# Patient Record
Sex: Female | Born: 1935
Health system: Southern US, Community
[De-identification: ages and names within clinical notes are randomized; demographics above are authoritative.]

## PROBLEM LIST (undated history)

## (undated) DIAGNOSIS — G309 Alzheimer's disease, unspecified: Secondary | ICD-10-CM

## (undated) DIAGNOSIS — I10 Essential (primary) hypertension: Secondary | ICD-10-CM

## (undated) DIAGNOSIS — F0280 Dementia in other diseases classified elsewhere without behavioral disturbance: Secondary | ICD-10-CM

## (undated) DIAGNOSIS — M1990 Unspecified osteoarthritis, unspecified site: Secondary | ICD-10-CM

## (undated) DIAGNOSIS — I341 Nonrheumatic mitral (valve) prolapse: Secondary | ICD-10-CM

## (undated) DIAGNOSIS — B019 Varicella without complication: Secondary | ICD-10-CM

## (undated) HISTORY — PX: ABDOMINAL HYSTERECTOMY: SHX81

## (undated) HISTORY — DX: Unspecified osteoarthritis, unspecified site: M19.90

## (undated) HISTORY — PX: OTHER SURGICAL HISTORY: SHX169

## (undated) HISTORY — DX: Varicella without complication: B01.9

## (undated) HISTORY — PX: TONSILLECTOMY AND ADENOIDECTOMY: SUR1326

## (undated) HISTORY — DX: Nonrheumatic mitral (valve) prolapse: I34.1

## (undated) HISTORY — PX: EYE SURGERY: SHX253

## (undated) HISTORY — DX: Essential (primary) hypertension: I10

---

## 2007-10-13 ENCOUNTER — Emergency Department (HOSPITAL_COMMUNITY): Admission: EM | Admit: 2007-10-13 | Discharge: 2007-10-13 | Payer: Self-pay | Admitting: Emergency Medicine

## 2010-07-01 ENCOUNTER — Ambulatory Visit: Payer: Self-pay | Admitting: Internal Medicine

## 2010-07-06 ENCOUNTER — Ambulatory Visit: Payer: Self-pay | Admitting: Internal Medicine

## 2010-07-09 ENCOUNTER — Ambulatory Visit: Payer: Self-pay | Admitting: Internal Medicine

## 2010-07-12 ENCOUNTER — Ambulatory Visit (INDEPENDENT_AMBULATORY_CARE_PROVIDER_SITE_OTHER): Payer: Medicare Other | Admitting: Internal Medicine

## 2010-07-12 ENCOUNTER — Encounter: Payer: Self-pay | Admitting: Internal Medicine

## 2010-07-12 VITALS — BP 136/86 | HR 74 | Temp 98.0°F | Resp 16 | Ht 67.0 in | Wt 127.0 lb

## 2010-07-12 DIAGNOSIS — I059 Rheumatic mitral valve disease, unspecified: Secondary | ICD-10-CM

## 2010-07-12 DIAGNOSIS — Z0000 Encounter for general adult medical examination without abnormal findings: Secondary | ICD-10-CM

## 2010-07-12 DIAGNOSIS — I10 Essential (primary) hypertension: Secondary | ICD-10-CM

## 2010-07-12 DIAGNOSIS — I341 Nonrheumatic mitral (valve) prolapse: Secondary | ICD-10-CM

## 2010-07-12 LAB — CBC WITH DIFFERENTIAL/PLATELET
Basophils Relative: 0.7 % (ref 0.0–3.0)
Eosinophils Relative: 2 % (ref 0.0–5.0)
Hemoglobin: 12.2 g/dL (ref 12.0–15.0)
Lymphocytes Relative: 36.3 % (ref 12.0–46.0)
MCV: 88.7 fl (ref 78.0–100.0)
Monocytes Absolute: 0.3 10*3/uL (ref 0.1–1.0)
Neutrophils Relative %: 46.7 % (ref 43.0–77.0)
RBC: 4.06 Mil/uL (ref 3.87–5.11)
WBC: 2.2 10*3/uL — ABNORMAL LOW (ref 4.5–10.5)

## 2010-07-12 LAB — LIPID PANEL: Cholesterol: 190 mg/dL (ref 0–200)

## 2010-07-12 LAB — BASIC METABOLIC PANEL
BUN: 22 mg/dL (ref 6–23)
Chloride: 100 mEq/L (ref 96–112)
Glucose, Bld: 65 mg/dL — ABNORMAL LOW (ref 70–99)
Potassium: 4 mEq/L (ref 3.5–5.1)

## 2010-07-12 LAB — HEPATIC FUNCTION PANEL
ALT: 51 U/L — ABNORMAL HIGH (ref 0–35)
AST: 52 U/L — ABNORMAL HIGH (ref 0–37)
Albumin: 3.6 g/dL (ref 3.5–5.2)

## 2010-07-12 MED ORDER — POTASSIUM CHLORIDE CRYS ER 20 MEQ PO TBCR: 20.0000 meq | EXTENDED_RELEASE_TABLET | Freq: Every day | ORAL | Status: DC

## 2010-07-12 MED ORDER — HYDROCHLOROTHIAZIDE 25 MG PO TABS: 25.0000 mg | ORAL_TABLET | Freq: Every day | ORAL | Status: DC

## 2010-07-12 NOTE — Progress Notes (Signed)
Subjective:    Patient ID: [name] [name], female    DOB: 01/31/1932, 77 y.o.   MRN: [id number]  HPI  77 year old patient who is seen today to establish with our practice. She relocated from [place] 77 approximately 3 months ago. She has an approximate 10 year history of hypertension but has been off diuretic therapy for over one week. Blood pressure today was well controlled. She has no concerns or complaints. She does have a history of mitral valve prolapse. In 2006 he underwent a transsphenoidal resection of a pituitary tumor the 2 visual problems. Apparently there were no endocrine issues. She does have a remote hysterectomy complete in 1974 due to benign disease.  1. Risk factors, based on past  M,S,F history- cardiovascular risk factors include hypertension  2.  Physical activities: Remains active without exercise limitations  3.  Depression/mood: No history of depression or mood disorder  4.  Hearing: No deficits 5.  ADL's: Independent in all aspects of daily living 6.  Fall risk: Low  7.  Home safety: No problems identified  8.  Height weight, and visual acuity; height weight stable. There has been an approximate 12 pound weight loss over the past few years due to change in eating habits 9.  Counseling: Calcium and vitamin D encouraged 10. Lab orders based on risk factors: Will check a laboratory screen including TSH  11. Referral : Not appropriate at this time  12. Care plan: Heart healthy diet exercise calcium and vitamin D all encouraged. She was encouraged to followup with ophthalmology  13. Cognitive assessment: Alert and oriented with normal affect no cognitive dysfunction      Review of Systems  Constitutional: Negative for fever, appetite change, fatigue and unexpected weight change.  HENT: Negative for hearing loss, ear pain, nosebleeds, congestion, sore throat, mouth sores, trouble swallowing, neck stiffness, dental problem, voice change, sinus pressure and  tinnitus.   Eyes: Negative for photophobia, pain, redness and visual disturbance.  Respiratory: Negative for cough, chest tightness and shortness of breath.   Cardiovascular: Negative for chest pain, palpitations and leg swelling.  Gastrointestinal: Negative for nausea, vomiting, abdominal pain, diarrhea, constipation, blood in stool, abdominal distention and rectal pain.  Genitourinary: Negative for dysuria, urgency, frequency, hematuria, flank pain, vaginal bleeding, vaginal discharge, difficulty urinating, genital sores, vaginal pain, menstrual problem and pelvic pain.  Musculoskeletal: Negative for back pain and arthralgias.  Skin: Negative for rash.  Neurological: Negative for dizziness, syncope, speech difficulty, weakness, light-headedness, numbness and headaches.  Hematological: Negative for adenopathy. Does not bruise/bleed easily.  Psychiatric/Behavioral: Negative for suicidal ideas, behavioral problems, self-injury, dysphoric mood and agitation. The patient is not nervous/anxious.        Objective:   Physical Exam  Constitutional: She is oriented to person, place, and time. She appears well-developed and well-nourished.  HENT:  Head: Normocephalic and atraumatic.  Right Ear: External ear normal.  Left Ear: External ear normal.  Mouth/Throat: Oropharynx is clear and moist.  Eyes: Conjunctivae and EOM are normal.  Neck: Normal range of motion. Neck supple. No JVD present. No thyromegaly present.  Cardiovascular: Normal rate, regular rhythm, normal heart sounds and intact distal pulses.   No murmur heard. Pulmonary/Chest: Effort normal and breath sounds normal. She has no wheezes. She has no rales.  Abdominal: Soft. Bowel sounds are normal. She exhibits no distension and no mass. There is no tenderness. There is no rebound and no guarding.  Musculoskeletal: Normal range of motion. She exhibits no edema and no  tenderness.  Neurological: She is alert and oriented to person, place,  and time. She has normal reflexes. No cranial nerve deficit. She exhibits normal muscle tone. Coordination normal.  Skin: Skin is warm and dry. No rash noted.  Psychiatric: She has a normal mood and affect. Her behavior is normal.          Assessment & Plan:  Unremarkable clinical examination History of voluntary weight loss Status post transsphenoidal resection of a benign pituitary tumor History of hypertension  Patient has been asked to obtain a home blood pressure monitor and check on blood pressure readings. Calcium and vitamin D supplements encouraged she did have a colonoscopy in 2011 as well as a mammogram in October of 2011. 2 status post complete hysterectomy in 1974  Recheck in 6 months  Laboratory screen will be reviewed

## 2010-07-12 NOTE — Patient Instructions (Addendum)
Limit your sodium (Salt) intake  Please check your blood pressure on a regular basis.  If it is consistently greater than 150/90, please make an office appointment. (and resume  BP medication)  Take a calcium supplement, plus [phone] units of vitamin D  Return in 6 months for follow-up  Ophthalmology followup

## 2010-07-19 ENCOUNTER — Encounter: Payer: Self-pay | Admitting: Internal Medicine

## 2010-07-19 ENCOUNTER — Ambulatory Visit (INDEPENDENT_AMBULATORY_CARE_PROVIDER_SITE_OTHER): Payer: Medicare Other | Admitting: Internal Medicine

## 2010-07-19 VITALS — BP 130/80 | Temp 98.7°F | Wt 126.0 lb

## 2010-07-19 DIAGNOSIS — I10 Essential (primary) hypertension: Secondary | ICD-10-CM

## 2010-07-19 NOTE — Progress Notes (Signed)
  Subjective:    Patient ID: [name] [name], female    DOB: January 22, 1932, 77 y.o.   MRN: [id number]  HPI  77 year old patient who is seen today in followup. She established care proximally one week ago with a history of hypertension. She was normotensive off hydrochlorothiazide. It was suggested that she obtain a home blood pressure monitor and track her blood pressures at home. She has not obtained a home blood pressure monitor yet. Instructions were to return in 6 months but she was given a appointment today. Blood pressure was again 130/70. She feels well.    Review of Systems  Constitutional: Negative.   HENT: Negative for hearing loss, congestion, sore throat, rhinorrhea, dental problem, sinus pressure and tinnitus.   Eyes: Negative for pain, discharge and visual disturbance.  Respiratory: Negative for cough and shortness of breath.   Cardiovascular: Negative for chest pain, palpitations and leg swelling.  Gastrointestinal: Negative for nausea, vomiting, abdominal pain, diarrhea, constipation, blood in stool and abdominal distention.  Genitourinary: Negative for dysuria, urgency, frequency, hematuria, flank pain, vaginal bleeding, vaginal discharge, difficulty urinating, vaginal pain and pelvic pain.  Musculoskeletal: Negative for joint swelling, arthralgias and gait problem.       [The patient has had an episode in the past of significant pain involving the dorsal aspect of the left foot this was assessed with pain and inflammation redness and swelling and resolved within one day. The episode was not inconsistent with gout Skin: Negative for rash.  Neurological: Negative for dizziness, syncope, speech difficulty, weakness, numbness and headaches.  Hematological: Negative for adenopathy.  Psychiatric/Behavioral: Negative for behavioral problems, dysphoric mood and agitation. The patient is not nervous/anxious.        Objective:   Physical Exam  Constitutional: She appears well-developed and  well-nourished. No distress.       Blood pressure 130/70          Assessment & Plan:  Normotensive off medication. The patient has been asked to obtain a home blood pressure monitor and to check home blood pressure readings. Calcium and vitamin D supplementations again encouraged. We'll recheck in 6 months.

## 2010-07-19 NOTE — Patient Instructions (Signed)
Please check your blood pressure on a regular basis.  If it is consistently greater than 150/90, please make an office appointment.  Limit your sodium (Salt) intake  Return in 6 months for follow-up  

## 2010-08-24 ENCOUNTER — Encounter: Payer: Self-pay | Admitting: Internal Medicine

## 2010-08-24 ENCOUNTER — Ambulatory Visit (INDEPENDENT_AMBULATORY_CARE_PROVIDER_SITE_OTHER): Payer: Medicare Other | Admitting: Internal Medicine

## 2010-08-24 VITALS — BP 110/70 | Temp 98.3°F | Wt 126.0 lb

## 2010-08-24 DIAGNOSIS — I10 Essential (primary) hypertension: Secondary | ICD-10-CM

## 2010-08-24 DIAGNOSIS — Z0000 Encounter for general adult medical examination without abnormal findings: Secondary | ICD-10-CM

## 2010-08-24 DIAGNOSIS — Z23 Encounter for immunization: Secondary | ICD-10-CM

## 2010-08-24 DIAGNOSIS — L03115 Cellulitis of right lower limb: Secondary | ICD-10-CM

## 2010-08-24 DIAGNOSIS — L03119 Cellulitis of unspecified part of limb: Secondary | ICD-10-CM

## 2010-08-24 DIAGNOSIS — L02419 Cutaneous abscess of limb, unspecified: Secondary | ICD-10-CM

## 2010-08-24 MED ORDER — CEPHALEXIN 500 MG PO CAPS: 500.0000 mg | ORAL_CAPSULE | Freq: Three times a day (TID) | ORAL | Status: AC

## 2010-08-24 NOTE — Patient Instructions (Signed)
Attempt to keep right leg elevated as much as possible\\Antibiotic therapy was prescribed for the child due to specific medical indications.  Take your antibiotic as prescribed until ALL of it is gone, but stop if you develop a rash, swelling, or any side effects of the medication.  Contact our office as soon as possible if  there are side effects of the medication.  Call if you develop  fever or worsening pain redness or drainage Call or return to clinic prn if these symptoms worsen or fail to improve as anticipated.

## 2010-08-24 NOTE — Progress Notes (Signed)
  Subjective:    Patient ID: [name] [name], female    DOB: 30-Jul-1931, 77 y.o.   MRN: [id number]  HPI 78 year old patient who has a history of hypertension. Presently she is monitoring blood pressure off medications. Blood pressure readings continue to be normal. Approximately 2 weeks ago she, ties to her right outer lower leg on the tail pipe of a car. She had initial scratch and seen to be stable initially over the past several days she has had persistent pain swelling and redness. There's been no fever or systemic symptoms. She has been applying topical antibiotic ointment without improvement    Review of Systems  Skin: Positive for wound.       Objective:   Physical Exam  Constitutional: She appears well-developed and well-nourished. No distress.       Blood pressure 110/70  Skin:       There is a small linear abrasion involving her right lower lateral leg there is a proximate 4 cm area surrounding tenderness erythema and mild soft tissue swelling. No exudate noted          Assessment & Plan:   Mild cellulitis right lower leg. Local wound care discussed. She'll be placed on Keflex. She'll attempt to elevate and notify the office if she does not promptly improve

## 2010-10-15 ENCOUNTER — Other Ambulatory Visit: Payer: Self-pay | Admitting: Internal Medicine

## 2010-10-15 MED ORDER — POTASSIUM CHLORIDE CRYS ER 20 MEQ PO TBCR: 20.0000 meq | EXTENDED_RELEASE_TABLET | Freq: Every day | ORAL | Status: DC

## 2010-10-15 NOTE — Telephone Encounter (Signed)
Med oreder efiled to [facility]

## 2010-10-15 NOTE — Telephone Encounter (Signed)
Pt req refill of potassium chloride SA (K-DUR,KLOR-CON) 20 MEQ tablet, to be sent to [facility] mail order for 90 day supply.

## 2011-02-16 ENCOUNTER — Ambulatory Visit (INDEPENDENT_AMBULATORY_CARE_PROVIDER_SITE_OTHER)
Admission: RE | Admit: 2011-02-16 | Discharge: 2011-02-16 | Disposition: A | Discharge status: Home / Self Care | Payer: Medicare Other | Source: Ambulatory Visit | Attending: Internal Medicine | Admitting: Internal Medicine

## 2011-02-16 ENCOUNTER — Telehealth: Payer: Self-pay | Admitting: *Deleted

## 2011-02-16 DIAGNOSIS — M25571 Pain in right ankle and joints of right foot: Secondary | ICD-10-CM

## 2011-02-16 DIAGNOSIS — M25579 Pain in unspecified ankle and joints of unspecified foot: Secondary | ICD-10-CM

## 2011-02-16 NOTE — Telephone Encounter (Signed)
Attempt to call - ans mach at hm# - LMTCB to discuss - will order xray to be done at [facility] - order put in

## 2011-02-16 NOTE — Telephone Encounter (Signed)
schedule x-ray; elevate ice and ibuprofen

## 2011-02-16 NOTE — Telephone Encounter (Signed)
Pt twisted right ankle yesterday, and is getting worse as the day progresses?  Asking for advice.  Is not taking anything for pain, but it is significant with any weight bearing.  Does Dr. Genella Rife to see her or xray, etc????

## 2011-02-17 NOTE — Progress Notes (Signed)
Quick Note:  Spoke with pt- informed of results ______ 

## 2011-05-03 ENCOUNTER — Encounter: Payer: Self-pay | Admitting: Internal Medicine

## 2011-05-03 ENCOUNTER — Ambulatory Visit (INDEPENDENT_AMBULATORY_CARE_PROVIDER_SITE_OTHER): Payer: Medicare Other | Admitting: Internal Medicine

## 2011-05-03 DIAGNOSIS — R634 Abnormal weight loss: Secondary | ICD-10-CM | POA: Diagnosis not present

## 2011-05-03 DIAGNOSIS — I10 Essential (primary) hypertension: Secondary | ICD-10-CM

## 2011-05-03 LAB — CBC WITH DIFFERENTIAL/PLATELET
Basophils Absolute: 0 10*3/uL (ref 0.0–0.1)
Eosinophils Absolute: 0 10*3/uL (ref 0.0–0.7)
Hemoglobin: 13.1 g/dL (ref 12.0–15.0)
Lymphocytes Relative: 31.9 % (ref 12.0–46.0)
MCHC: 33.3 g/dL (ref 30.0–36.0)
Monocytes Absolute: 0.4 10*3/uL (ref 0.1–1.0)
Neutro Abs: 1.5 10*3/uL (ref 1.4–7.7)
Neutrophils Relative %: 52.6 % (ref 43.0–77.0)
RDW: 13.6 % (ref 11.5–14.6)

## 2011-05-03 LAB — SEDIMENTATION RATE: Sed Rate: 22 mm/hr (ref 0–22)

## 2011-05-03 MED ORDER — POTASSIUM CHLORIDE CRYS ER 20 MEQ PO TBCR: 20.0000 meq | EXTENDED_RELEASE_TABLET | Freq: Every day | ORAL | Status: DC

## 2011-05-03 MED ORDER — HYDROCHLOROTHIAZIDE 25 MG PO TABS: 25.0000 mg | ORAL_TABLET | Freq: Every day | ORAL | Status: DC

## 2011-05-03 NOTE — Progress Notes (Signed)
  Subjective:    Patient ID: [name] [name], female    DOB: 22-Feb-1932, 78 y.o.   MRN: [id number]  HPI  Wt Readings from Last 3 Encounters:  05/03/11 126 lb (57.153 kg)  08/24/10 126 lb (57.153 kg)  07/19/10 126 lb (57.153 kg)      78 year old patient who is seen today for followup. She has a history of hypertension controlled on diuretic therapy. She has some concern about weight loss. There's been no change in weight over the past 9 or 10 months. She generally feels well. She took a bus to [place] yesterday for [employer] and came back late yesterday evening.  She complains of some thinning of the hair but in general does quite well. She experienced 6 weeks of diarrhea following Cleocin for a dental infection  Review of Systems  Constitutional: Positive for unexpected weight change. Negative for fever, appetite change and fatigue.  HENT: Negative for hearing loss, ear pain, nosebleeds, congestion, sore throat, mouth sores, trouble swallowing, neck stiffness, dental problem, voice change, sinus pressure and tinnitus.   Eyes: Negative for photophobia, pain, redness and visual disturbance.  Respiratory: Negative for cough, chest tightness and shortness of breath.   Cardiovascular: Negative for chest pain, palpitations and leg swelling.  Gastrointestinal: Negative for nausea, vomiting, abdominal pain, diarrhea, constipation, blood in stool, abdominal distention and rectal pain.  Genitourinary: Negative for dysuria, urgency, frequency, hematuria, flank pain, vaginal bleeding, vaginal discharge, difficulty urinating, genital sores, vaginal pain, menstrual problem and pelvic pain.  Musculoskeletal: Negative for back pain and arthralgias.  Skin: Negative for rash.  Neurological: Negative for dizziness, syncope, speech difficulty, weakness, light-headedness, numbness and headaches.  Hematological: Negative for adenopathy. Does not bruise/bleed easily.  Psychiatric/Behavioral: Negative for  suicidal ideas, behavioral problems, self-injury, dysphoric mood and agitation. The patient is not nervous/anxious.        Objective:   Physical Exam  Constitutional: She is oriented to person, place, and time. She appears well-developed and well-nourished.       Thin. Appears well no distress  HENT:  Head: Normocephalic.  Right Ear: External ear normal.  Left Ear: External ear normal.  Mouth/Throat: Oropharynx is clear and moist.  Eyes: Conjunctivae and EOM are normal. Pupils are equal, round, and reactive to light.  Neck: Normal range of motion. Neck supple. No thyromegaly present.  Cardiovascular: Normal rate, regular rhythm, normal heart sounds and intact distal pulses.   Pulmonary/Chest: Effort normal and breath sounds normal.  Abdominal: Soft. Bowel sounds are normal. She exhibits no mass. There is no tenderness.  Musculoskeletal: Normal range of motion.  Lymphadenopathy:    She has no cervical adenopathy.  Neurological: She is alert and oriented to person, place, and time.  Skin: Skin is warm and dry. No rash noted.  Psychiatric: She has a normal mood and affect. Her behavior is normal.          Assessment & Plan:   Hypertension well controlled History of weight loss. Presently appears to be stable History of leukopenia and mild elevated transaminases. Will followup labs  Schedule physical in 6 months

## 2011-05-03 NOTE — Patient Instructions (Signed)
It is important that you exercise regularly, at least 20 minutes 3 to 4 times per week.  If you develop chest pain or shortness of breath seek  medical attention.  Limit your sodium (Salt) intake  Return in 6 months for follow-up  

## 2011-05-04 LAB — COMPREHENSIVE METABOLIC PANEL
ALT: 40 U/L — ABNORMAL HIGH (ref 0–35)
AST: 53 U/L — ABNORMAL HIGH (ref 0–37)
Albumin: 4 g/dL (ref 3.5–5.2)
Calcium: 9.4 mg/dL (ref 8.4–10.5)
Chloride: 102 mEq/L (ref 96–112)
Potassium: 4.3 mEq/L (ref 3.5–5.1)

## 2011-05-04 LAB — TSH: TSH: 2.3 u[IU]/mL (ref 0.35–5.50)

## 2011-06-20 DIAGNOSIS — L84 Corns and callosities: Secondary | ICD-10-CM | POA: Diagnosis not present

## 2011-06-20 DIAGNOSIS — L608 Other nail disorders: Secondary | ICD-10-CM | POA: Diagnosis not present

## 2011-06-20 DIAGNOSIS — I739 Peripheral vascular disease, unspecified: Secondary | ICD-10-CM | POA: Diagnosis not present

## 2011-07-27 DIAGNOSIS — H40009 Preglaucoma, unspecified, unspecified eye: Secondary | ICD-10-CM | POA: Diagnosis not present

## 2011-09-08 DIAGNOSIS — I739 Peripheral vascular disease, unspecified: Secondary | ICD-10-CM | POA: Diagnosis not present

## 2011-09-08 DIAGNOSIS — L84 Corns and callosities: Secondary | ICD-10-CM | POA: Diagnosis not present

## 2011-09-08 DIAGNOSIS — L608 Other nail disorders: Secondary | ICD-10-CM | POA: Diagnosis not present

## 2011-10-03 DIAGNOSIS — H4010X0 Unspecified open-angle glaucoma, stage unspecified: Secondary | ICD-10-CM | POA: Diagnosis not present

## 2011-10-03 DIAGNOSIS — H25019 Cortical age-related cataract, unspecified eye: Secondary | ICD-10-CM | POA: Diagnosis not present

## 2011-10-27 ENCOUNTER — Encounter: Payer: Self-pay | Admitting: Internal Medicine

## 2011-10-27 ENCOUNTER — Ambulatory Visit (INDEPENDENT_AMBULATORY_CARE_PROVIDER_SITE_OTHER): Payer: Medicare Other | Admitting: Internal Medicine

## 2011-10-27 VITALS — BP 142/84 | Temp 98.4°F | Wt 125.0 lb

## 2011-10-27 DIAGNOSIS — I10 Essential (primary) hypertension: Secondary | ICD-10-CM

## 2011-10-27 MED ORDER — HYDROCHLOROTHIAZIDE 25 MG PO TABS: 25.0000 mg | ORAL_TABLET | Freq: Every day | ORAL | Status: DC

## 2011-10-27 MED ORDER — POTASSIUM CHLORIDE CRYS ER 20 MEQ PO TBCR: 20.0000 meq | EXTENDED_RELEASE_TABLET | Freq: Every day | ORAL | Status: DC

## 2011-10-27 NOTE — Progress Notes (Signed)
Subjective:    Patient ID: [name] [name], female    DOB: [birth year], 78 y.o.   MRN: [id number]  HPI  78 year old patient who is seen today for followup. She has treated hypertension which has been controlled on diuretic therapy. She has done quite well. She does have a history of weight loss that has been stable over the past year. No concerns or complaints today.  Wt Readings from Last 3 Encounters:  10/27/11 125 lb (56.7 kg)  05/03/11 126 lb (57.153 kg)  08/24/10 126 lb (57.153 kg)     Past Medical History  Diagnosis Date  . Hypertension   . Chicken pox     as a child  . Mumps     as a child  . MVP (mitral valve prolapse)     History   Social History  . Marital Status: Widowed    Spouse Name: N/A    Number of Children: N/A  . Years of Education: N/A   Occupational History  . Not on file.   Social History Main Topics  . Smoking status: Never Smoker   . Smokeless tobacco: Never Used  . Alcohol Use: Yes     rarley  . Drug Use: No  . Sexually Active: Not on file   Other Topics Concern  . Not on file   Social History Narrative   Retired since 2004. [employer] in [address] masters degree widowed.  re located to [facility] 2011    Past Surgical History  Procedure Date  . Abdominal hysterectomy   . Pitutary tumor     2006 - removed  . Tonsillectomy and adenoidectomy     Family History  Problem Relation Age of Onset  . Diabetes Sister   . Hypertension Sister   . Hypertension Brother   . Diabetes Brother     No Known Allergies  Current Outpatient Prescriptions on File Prior to Visit  Medication Sig Dispense Refill  . Cyanocobalamin (VITAMIN B 12 PO) Take by mouth daily.      . fish oil-omega-3 fatty acids 1000 MG capsule Take 2 g by mouth daily.      . hydrochlorothiazide (HYDRODIURIL) 25 MG tablet Take 1 tablet (25 mg total) by mouth daily.  90 tablet  6  . potassium chloride SA (K-DUR,KLOR-CON) 20 MEQ tablet Take 1 tablet (20 mEq  total) by mouth daily.  90 tablet  3    BP 142/84  Temp 98.4 F (36.9 C) (Oral)  Wt 125 lb (56.7 kg)    Review of Systems  Constitutional: Negative.   HENT: Negative for hearing loss, congestion, sore throat, rhinorrhea, dental problem, sinus pressure and tinnitus.   Eyes: Negative for pain, discharge and visual disturbance.  Respiratory: Negative for cough and shortness of breath.   Cardiovascular: Negative for chest pain, palpitations and leg swelling.  Gastrointestinal: Negative for nausea, vomiting, abdominal pain, diarrhea, constipation, blood in stool and abdominal distention.  Genitourinary: Negative for dysuria, urgency, frequency, hematuria, flank pain, vaginal bleeding, vaginal discharge, difficulty urinating, vaginal pain and pelvic pain.  Musculoskeletal: Negative for joint swelling, arthralgias and gait problem.  Skin: Negative for rash.  Neurological: Negative for dizziness, syncope, speech difficulty, weakness, numbness and headaches.  Hematological: Negative for adenopathy.  Psychiatric/Behavioral: Negative for behavioral problems, dysphoric mood and agitation. The patient is not nervous/anxious.        Objective:   Physical Exam        Assessment & Plan:   Hypertension. Well controlled.  We'll continue present regimen. Recheck in 6 months for her annual exam. Medications refilled

## 2011-10-27 NOTE — Patient Instructions (Signed)
Please check your blood pressure on a regular basis.  If it is consistently greater than 150/90, please make an office appointment.    It is important that you exercise regularly, at least 20 minutes 3 to 4 times per week.  If you develop chest pain or shortness of breath seek  medical attention.  Take a calcium supplement, plus [phone] units of vitamin D

## 2011-11-01 DIAGNOSIS — H902 Conductive hearing loss, unspecified: Secondary | ICD-10-CM | POA: Diagnosis not present

## 2011-11-01 DIAGNOSIS — H6120 Impacted cerumen, unspecified ear: Secondary | ICD-10-CM | POA: Diagnosis not present

## 2011-11-17 DIAGNOSIS — L242 Irritant contact dermatitis due to solvents: Secondary | ICD-10-CM | POA: Diagnosis not present

## 2011-11-17 DIAGNOSIS — L299 Pruritus, unspecified: Secondary | ICD-10-CM | POA: Diagnosis not present

## 2012-01-17 DIAGNOSIS — H25019 Cortical age-related cataract, unspecified eye: Secondary | ICD-10-CM | POA: Diagnosis not present

## 2012-01-17 DIAGNOSIS — H4010X0 Unspecified open-angle glaucoma, stage unspecified: Secondary | ICD-10-CM | POA: Diagnosis not present

## 2012-01-17 DIAGNOSIS — H43399 Other vitreous opacities, unspecified eye: Secondary | ICD-10-CM | POA: Diagnosis not present

## 2012-01-26 DIAGNOSIS — L608 Other nail disorders: Secondary | ICD-10-CM | POA: Diagnosis not present

## 2012-01-26 DIAGNOSIS — L84 Corns and callosities: Secondary | ICD-10-CM | POA: Diagnosis not present

## 2012-01-26 DIAGNOSIS — I739 Peripheral vascular disease, unspecified: Secondary | ICD-10-CM | POA: Diagnosis not present

## 2012-02-10 HISTORY — PX: CATARACT EXTRACTION: SUR2

## 2012-02-17 DIAGNOSIS — H2510 Age-related nuclear cataract, unspecified eye: Secondary | ICD-10-CM | POA: Diagnosis not present

## 2012-02-17 DIAGNOSIS — H269 Unspecified cataract: Secondary | ICD-10-CM | POA: Diagnosis not present

## 2012-03-17 DIAGNOSIS — H16149 Punctate keratitis, unspecified eye: Secondary | ICD-10-CM | POA: Diagnosis not present

## 2012-05-07 DIAGNOSIS — I739 Peripheral vascular disease, unspecified: Secondary | ICD-10-CM | POA: Diagnosis not present

## 2012-05-07 DIAGNOSIS — L608 Other nail disorders: Secondary | ICD-10-CM | POA: Diagnosis not present

## 2012-05-07 DIAGNOSIS — L84 Corns and callosities: Secondary | ICD-10-CM | POA: Diagnosis not present

## 2012-05-18 DIAGNOSIS — H2510 Age-related nuclear cataract, unspecified eye: Secondary | ICD-10-CM | POA: Diagnosis not present

## 2012-05-24 ENCOUNTER — Ambulatory Visit: Payer: Medicare Other | Admitting: Internal Medicine

## 2012-06-04 ENCOUNTER — Ambulatory Visit (INDEPENDENT_AMBULATORY_CARE_PROVIDER_SITE_OTHER): Payer: Medicare Other | Admitting: Internal Medicine

## 2012-06-04 ENCOUNTER — Encounter: Payer: Self-pay | Admitting: Internal Medicine

## 2012-06-04 VITALS — BP 132/74 | HR 67 | Temp 98.7°F | Resp 18 | Wt 122.0 lb

## 2012-06-04 DIAGNOSIS — I10 Essential (primary) hypertension: Secondary | ICD-10-CM | POA: Diagnosis not present

## 2012-06-04 NOTE — Patient Instructions (Addendum)
Limit your sodium (Salt) intake    It is important that you exercise regularly, at least 20 minutes 3 to 4 times per week.  If you develop chest pain or shortness of breath seek  medical attention.  Return in 4 months for follow-up   

## 2012-06-04 NOTE — Progress Notes (Signed)
Subjective:    Patient ID: [name] [name], female    DOB: 05-20-1931, 79 y.o.   MRN: [id number]  HPI  79 year old patient who is seen today for her biannual followup. She has treated hypertension. She has done quite well. There has been some modest weight loss. No other concerns or complaints. He  Wt Readings from Last 3 Encounters:  06/04/12 122 lb (55.339 kg)  10/27/11 125 lb (56.7 kg)  05/03/11 126 lb (57.153 kg)     Past Medical History  Diagnosis Date  . Hypertension   . Chicken pox     as a child  . Mumps     as a child  . MVP (mitral valve prolapse)     History   Social History  . Marital Status: Widowed    Spouse Name: N/A    Number of Children: N/A  . Years of Education: N/A   Occupational History  . Not on file.   Social History Main Topics  . Smoking status: Never Smoker   . Smokeless tobacco: Never Used  . Alcohol Use: Yes     Comment: rarley  . Drug Use: No  . Sexually Active: Not on file   Other Topics Concern  . Not on file   Social History Narrative   Retired since 2004. [employer] in [address] masters degree widowed.  re located to [facility] 2011    Past Surgical History  Procedure Laterality Date  . Abdominal hysterectomy    . 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 Tonsillectomy and adenoidectomy    . Eye surgery    . Cataract extraction Right 11 2013    Family History  Problem Relation Age of Onset  . Diabetes Sister   . Hypertension Sister   . Hypertension Brother   . Diabetes Brother     No Known Allergies  Current Outpatient Prescriptions on File Prior to Visit  Medication Sig Dispense Refill  . hydrochlorothiazide (HYDRODIURIL) 25 MG tablet Take 1 tablet (25 mg total) by mouth daily.  90 tablet  6  . potassium chloride SA (K-DUR,KLOR-CON) 20 MEQ tablet Take 1 tablet (20 mEq total) by mouth daily.  90 tablet  3   No current facility-administered medications on file prior to visit.    BP 132/74   Pulse 67  Temp(Src) 98.7 F (37.1 C) (Oral)  Resp 18  Wt 122 lb (55.339 kg)  BMI 19.1 kg/m2  SpO2 98%    Review of Systems  Constitutional: Positive for unexpected weight change.  HENT: Negative for hearing loss, congestion, sore throat, rhinorrhea, dental problem, sinus pressure and tinnitus.   Eyes: Negative for pain, discharge and visual disturbance.  Respiratory: Negative for cough and shortness of breath.   Cardiovascular: Negative for chest pain, palpitations and leg swelling.  Gastrointestinal: Negative for nausea, vomiting, abdominal pain, diarrhea, constipation, blood in stool and abdominal distention.  Genitourinary: Negative for dysuria, urgency, frequency, hematuria, flank pain, vaginal bleeding, vaginal discharge, difficulty urinating, vaginal pain and pelvic pain.  Musculoskeletal: Negative for joint swelling, arthralgias and gait problem.  Skin: Negative for rash.  Neurological: Negative for dizziness, syncope, speech difficulty, weakness, numbness and headaches.  Hematological: Negative for adenopathy.  Psychiatric/Behavioral: Negative for behavioral problems, dysphoric mood and agitation. The patient is not nervous/anxious.        Objective:   Physical Exam  Constitutional: She is oriented to person, place, and time. She appears well-developed and well-nourished.  HENT:  Head: Normocephalic.  Right Ear: External ear normal.  Left Ear: External ear normal.  Mouth/Throat: Oropharynx is clear and moist.  Eyes: Conjunctivae and EOM are normal. Pupils are equal, round, and reactive to light.  Neck: Normal range of motion. Neck supple. No thyromegaly present.  Cardiovascular: Normal rate, regular rhythm, normal heart sounds and intact distal pulses.   Pulmonary/Chest: Effort normal and breath sounds normal.  Abdominal: Soft. Bowel sounds are normal. She exhibits no mass. There is no tenderness.  Musculoskeletal: Normal range of motion.  Lymphadenopathy:    She has  no cervical adenopathy.  Neurological: She is alert and oriented to person, place, and time.  Skin: Skin is warm and dry. No rash noted.  Psychiatric: She has a normal mood and affect. Her behavior is normal.          Assessment & Plan:   Hypertension. Well controlled Modest weight loss. We'll see in 6 months for her annual exam. She report any further weight loss.  No  change in medication All medications refilled

## 2012-06-05 DIAGNOSIS — H2510 Age-related nuclear cataract, unspecified eye: Secondary | ICD-10-CM | POA: Diagnosis not present

## 2012-06-05 DIAGNOSIS — H20019 Primary iridocyclitis, unspecified eye: Secondary | ICD-10-CM | POA: Diagnosis not present

## 2012-06-11 DIAGNOSIS — H2000 Unspecified acute and subacute iridocyclitis: Secondary | ICD-10-CM | POA: Diagnosis not present

## 2012-06-26 ENCOUNTER — Telehealth: Payer: Self-pay | Admitting: Internal Medicine

## 2012-06-26 ENCOUNTER — Other Ambulatory Visit: Payer: Self-pay | Admitting: *Deleted

## 2012-06-26 MED ORDER — HYDROCHLOROTHIAZIDE 25 MG PO TABS: 25.0000 mg | ORAL_TABLET | Freq: Every day | ORAL | Status: DC

## 2012-06-26 NOTE — Telephone Encounter (Signed)
Spoke to pt told her I received a request from [facility] and sent 90 day supply with refills to them this morning. Told pt will [facility] supply to [facility] [address] for her to mail order comes. Pt verbalized understanding.

## 2012-06-26 NOTE — Telephone Encounter (Signed)
Pt needs new rx hctz 25 mg #90 with refills  [facility] [address]. Pt is out

## 2012-07-03 DIAGNOSIS — H20029 Recurrent acute iridocyclitis, unspecified eye: Secondary | ICD-10-CM | POA: Diagnosis not present

## 2012-07-03 DIAGNOSIS — H25019 Cortical age-related cataract, unspecified eye: Secondary | ICD-10-CM | POA: Diagnosis not present

## 2012-07-30 DIAGNOSIS — L608 Other nail disorders: Secondary | ICD-10-CM | POA: Diagnosis not present

## 2012-07-30 DIAGNOSIS — I739 Peripheral vascular disease, unspecified: Secondary | ICD-10-CM | POA: Diagnosis not present

## 2012-07-30 DIAGNOSIS — L84 Corns and callosities: Secondary | ICD-10-CM | POA: Diagnosis not present

## 2012-08-30 DIAGNOSIS — H01009 Unspecified blepharitis unspecified eye, unspecified eyelid: Secondary | ICD-10-CM | POA: Diagnosis not present

## 2012-08-30 DIAGNOSIS — H2000 Unspecified acute and subacute iridocyclitis: Secondary | ICD-10-CM | POA: Diagnosis not present

## 2012-08-30 DIAGNOSIS — H26499 Other secondary cataract, unspecified eye: Secondary | ICD-10-CM | POA: Diagnosis not present

## 2012-10-09 DIAGNOSIS — L608 Other nail disorders: Secondary | ICD-10-CM | POA: Diagnosis not present

## 2012-10-09 DIAGNOSIS — L84 Corns and callosities: Secondary | ICD-10-CM | POA: Diagnosis not present

## 2012-10-09 DIAGNOSIS — I739 Peripheral vascular disease, unspecified: Secondary | ICD-10-CM | POA: Diagnosis not present

## 2012-10-11 DIAGNOSIS — H26499 Other secondary cataract, unspecified eye: Secondary | ICD-10-CM | POA: Diagnosis not present

## 2012-10-11 DIAGNOSIS — H20029 Recurrent acute iridocyclitis, unspecified eye: Secondary | ICD-10-CM | POA: Diagnosis not present

## 2012-10-11 DIAGNOSIS — H2000 Unspecified acute and subacute iridocyclitis: Secondary | ICD-10-CM | POA: Diagnosis not present

## 2012-10-11 DIAGNOSIS — H01009 Unspecified blepharitis unspecified eye, unspecified eyelid: Secondary | ICD-10-CM | POA: Diagnosis not present

## 2012-10-24 DIAGNOSIS — H2510 Age-related nuclear cataract, unspecified eye: Secondary | ICD-10-CM | POA: Diagnosis not present

## 2012-10-29 ENCOUNTER — Other Ambulatory Visit: Payer: Self-pay | Admitting: Internal Medicine

## 2012-11-01 DIAGNOSIS — H269 Unspecified cataract: Secondary | ICD-10-CM | POA: Diagnosis not present

## 2012-11-01 DIAGNOSIS — H2510 Age-related nuclear cataract, unspecified eye: Secondary | ICD-10-CM | POA: Diagnosis not present

## 2012-11-26 ENCOUNTER — Telehealth: Payer: Self-pay | Admitting: Internal Medicine

## 2012-11-26 NOTE — Telephone Encounter (Signed)
Patient Information:  Caller Name: [facility]  Phone: [phone]  Patient: [name] [name], [name] [name]  Gender: Female  DOB: 1932-01-20  Age: 79 Years  PCP: Eleonore Chiquito (Family Practice > 41yrs old)  Office Follow Up:  Does the office need to follow up with this patient?: No  Instructions For The Office: N/A   Symptoms  Reason For Call & Symptoms: Pt has a bee sting from 11/24/12 on her upper arm. Pt is not allergic. The swelling is 1" around the bite and red. No fever.  Reviewed Health History In EMR: Yes  Reviewed Medications In EMR: Yes  Reviewed Allergies In EMR: Yes  Reviewed Surgeries / Procedures: Yes  Date of Onset of Symptoms: 11/24/2012  Treatments Tried: aloe cream and peroxide  Treatments Tried Worked: No  Guideline(s) Used:  Warden/ranger  Disposition Per Guideline:   Home Care  Reason For Disposition Reached:   Normal local reaction to bee, wasp, or yellow jacket sting  Advice Given:  Apply Cold to the Area for Pain - Cold Pack Method:  Wrap a bag of ice in a towel (or use a bag of frozen vegetables such as peas).  Apply this cold pack to the area of the sting for 10-20 minutes.  You may repeat this as needed, to relieve symptoms of pain and swelling.  Hydrocortisone Cream for Itching:  Hydrocortisone cream applied to the sting area 4 times a day can also help reduce itching. Use it for a couple days until the itch is mild.  Available over-the-counter in [place] as 0.5% and 1% cream.  Antihistamine Medication for Itching:  If the sting becomes very itchy, you can take diphenhydramine (e.g., Benadryl). The adult dosage 25-50 mg by mouth every 6 hours on an as needed basis.  Call Back If:  Swelling becomes huge  Sting begins to look infected  Call Back If:  Fever occurs  Spreading redness or a red streak occurs  Sore increases in size  Patient Will Follow Care Advice:  YES

## 2012-12-03 ENCOUNTER — Ambulatory Visit (INDEPENDENT_AMBULATORY_CARE_PROVIDER_SITE_OTHER): Payer: Medicare Other | Admitting: Internal Medicine

## 2012-12-03 ENCOUNTER — Encounter: Payer: Self-pay | Admitting: Internal Medicine

## 2012-12-03 VITALS — BP 110/64 | HR 74 | Temp 98.6°F | Resp 20 | Wt 114.0 lb

## 2012-12-03 DIAGNOSIS — R634 Abnormal weight loss: Secondary | ICD-10-CM | POA: Diagnosis not present

## 2012-12-03 DIAGNOSIS — I10 Essential (primary) hypertension: Secondary | ICD-10-CM | POA: Diagnosis not present

## 2012-12-03 DIAGNOSIS — Z23 Encounter for immunization: Secondary | ICD-10-CM

## 2012-12-03 DIAGNOSIS — R7989 Other specified abnormal findings of blood chemistry: Secondary | ICD-10-CM | POA: Diagnosis not present

## 2012-12-03 DIAGNOSIS — R945 Abnormal results of liver function studies: Secondary | ICD-10-CM

## 2012-12-03 LAB — CBC WITH DIFFERENTIAL/PLATELET
Basophils Absolute: 0 10*3/uL (ref 0.0–0.1)
Eosinophils Absolute: 0.1 10*3/uL (ref 0.0–0.7)
Lymphocytes Relative: 31.1 % (ref 12.0–46.0)
MCHC: 33.2 g/dL (ref 30.0–36.0)
Monocytes Relative: 10.5 % (ref 3.0–12.0)
Neutrophils Relative %: 54.9 % (ref 43.0–77.0)
Platelets: 190 10*3/uL (ref 150.0–400.0)
RDW: 13.4 % (ref 11.5–14.6)

## 2012-12-03 LAB — COMPREHENSIVE METABOLIC PANEL
ALT: 40 U/L — ABNORMAL HIGH (ref 0–35)
Albumin: 3.9 g/dL (ref 3.5–5.2)
CO2: 33 mEq/L — ABNORMAL HIGH (ref 19–32)
Calcium: 9.3 mg/dL (ref 8.4–10.5)
Chloride: 99 mEq/L (ref 96–112)
Creatinine, Ser: 1 mg/dL (ref 0.4–1.2)
GFR: 65.71 mL/min (ref >60.00)
Potassium: 3.8 mEq/L (ref 3.5–5.1)
Total Protein: 7.3 g/dL (ref 6.0–8.3)

## 2012-12-03 LAB — LIPID PANEL
Total CHOL/HDL Ratio: 3
Triglycerides: 18 mg/dL (ref 0.0–149.0)

## 2012-12-03 NOTE — Patient Instructions (Signed)
Return in 6 months for follow up

## 2012-12-03 NOTE — Progress Notes (Signed)
Subjective:    Patient ID: [name] [name], female    DOB: 12-04-1931, 79 y.o.   MRN: [id number]  HPI   79 year old patient who has hypertension controlled on diuretic therapy. She is also on potassium supplementation. She is seen today for routine followup. Since her last visit here she has had some additional weight loss. She feels well and also feels that her caloric intake has been adequate. She is status with our practice and 2012. She does not recall her last colonoscopy. Past medical history is pertinent for history pituitary adenoma.  Past Medical History  Diagnosis Date  . Hypertension   . Chicken pox     as a child  . Mumps     as a child  . MVP (mitral valve prolapse)     History   Social History  . Marital Status: Widowed    Spouse Name: N/A    Number of Children: N/A  . Years of Education: N/A   Occupational History  . Not on file.   Social History Main Topics  . Smoking status: Never Smoker   . Smokeless tobacco: Never Used  . Alcohol Use: Yes     Comment: rarley  . Drug Use: No  . Sexual Activity: Not on file   Other Topics Concern  . Not on file   Social History Narrative   Retired since 2004. [employer] in [address] masters degree widowed.  re located to [facility] 2011    Past Surgical History  Procedure Laterality Date  . Abdominal hysterectomy    . 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 Tonsillectomy and adenoidectomy    . Eye surgery    . Cataract extraction Right 11 2013    Family History  Problem Relation Age of Onset  . Diabetes Sister   . Hypertension Sister   . Hypertension Brother   . Diabetes Brother     No Known Allergies  Current Outpatient Prescriptions on File Prior to Visit  Medication Sig Dispense Refill  . hydrochlorothiazide (HYDRODIURIL) 25 MG tablet Take 1 tablet (25 mg total) by mouth daily.  30 tablet  1  . KLOR-CON M20 20 MEQ tablet TAKE 1 TABLET DAILY  90 tablet  3  . MULTIPLE VITAMIN PO  Take 1 tablet by mouth daily.       No current facility-administered medications on file prior to visit.    BP 110/64  Pulse 74  Temp(Src) 98.6 F (37 C) (Oral)  Resp 20  Wt 114 lb (51.71 kg)  BMI 17.85 kg/m2  SpO2 96%       Review of Systems  Constitutional: Positive for unexpected weight change. Negative for activity change, appetite change and fatigue.  HENT: Negative for hearing loss, congestion, sore throat, rhinorrhea, dental problem, sinus pressure and tinnitus.   Eyes: Negative for pain, discharge and visual disturbance.  Respiratory: Negative for cough and shortness of breath.   Cardiovascular: Negative for chest pain, palpitations and leg swelling.  Gastrointestinal: Negative for nausea, vomiting, abdominal pain, diarrhea, constipation, blood in stool and abdominal distention.  Genitourinary: Negative for dysuria, urgency, frequency, hematuria, flank pain, vaginal bleeding, vaginal discharge, difficulty urinating, vaginal pain and pelvic pain.  Musculoskeletal: Negative for joint swelling, arthralgias and gait problem.  Skin: Negative for rash.  Neurological: Negative for dizziness, syncope, speech difficulty, weakness, numbness and headaches.  Hematological: Negative for adenopathy.  Psychiatric/Behavioral: Negative for behavioral problems, dysphoric mood and agitation. The patient is  not nervous/anxious.        Objective:   Physical Exam  Constitutional: She is oriented to person, place, and time. She appears well-developed. No distress.  Appears thin but in no acute distress. Blood pressure low normal range.  HENT:  Head: Normocephalic.  Right Ear: External ear normal.  Left Ear: External ear normal.  Mouth/Throat: Oropharynx is clear and moist.  Eyes: Conjunctivae and EOM are normal. Pupils are equal, round, and reactive to light.  Neck: Normal range of motion. Neck supple. No thyromegaly present.  Cardiovascular: Normal rate, regular rhythm, normal heart  sounds and intact distal pulses.   Pulmonary/Chest: Effort normal and breath sounds normal.  Abdominal: Soft. Bowel sounds are normal. She exhibits no mass. There is no tenderness.  Musculoskeletal: Normal range of motion.  Lymphadenopathy:    She has no cervical adenopathy.  Neurological: She is alert and oriented to person, place, and time.  Skin: Skin is warm and dry. No rash noted.  No clubbing  Psychiatric: She has a normal mood and affect. Her behavior is normal.          Assessment & Plan:   Weight loss. Will check updated lab including sedimentation rate. In view of her history pituitary adenoma we'll also check a cortisol level Hypertension stable  Will schedule for a complete examination Records from her prior physician in [place] requested

## 2012-12-04 LAB — HEPATITIS C ANTIBODY: HCV Ab: NEGATIVE

## 2012-12-05 ENCOUNTER — Telehealth: Payer: Self-pay | Admitting: Internal Medicine

## 2012-12-05 NOTE — Telephone Encounter (Signed)
Pt returned your call.  

## 2012-12-06 ENCOUNTER — Other Ambulatory Visit: Payer: Self-pay | Admitting: Internal Medicine

## 2012-12-06 ENCOUNTER — Telehealth: Payer: Self-pay | Admitting: Internal Medicine

## 2012-12-06 DIAGNOSIS — R748 Abnormal levels of other serum enzymes: Secondary | ICD-10-CM

## 2012-12-06 NOTE — Telephone Encounter (Signed)
Called pt back see lab result note

## 2012-12-06 NOTE — Telephone Encounter (Signed)
Would like to ask additional questions about medical  Health form from previous MD

## 2012-12-07 NOTE — Telephone Encounter (Signed)
Spoke to pt wanted to make sure she filled out record release of information correctly before she mailed it to her prior PCP.

## 2012-12-07 NOTE — Telephone Encounter (Signed)
Spoke to pt

## 2012-12-13 ENCOUNTER — Ambulatory Visit
Admission: RE | Admit: 2012-12-13 | Discharge: 2012-12-13 | Disposition: A | Discharge status: Home / Self Care | Payer: Medicare Other | Source: Ambulatory Visit | Attending: Internal Medicine | Admitting: Internal Medicine

## 2012-12-13 DIAGNOSIS — R748 Abnormal levels of other serum enzymes: Secondary | ICD-10-CM

## 2012-12-13 DIAGNOSIS — R7989 Other specified abnormal findings of blood chemistry: Secondary | ICD-10-CM | POA: Diagnosis not present

## 2013-01-15 DIAGNOSIS — H4320 Crystalline deposits in vitreous body, unspecified eye: Secondary | ICD-10-CM | POA: Diagnosis not present

## 2013-01-15 DIAGNOSIS — H40019 Open angle with borderline findings, low risk, unspecified eye: Secondary | ICD-10-CM | POA: Diagnosis not present

## 2013-01-22 DIAGNOSIS — L84 Corns and callosities: Secondary | ICD-10-CM | POA: Diagnosis not present

## 2013-01-22 DIAGNOSIS — L608 Other nail disorders: Secondary | ICD-10-CM | POA: Diagnosis not present

## 2013-01-22 DIAGNOSIS — I739 Peripheral vascular disease, unspecified: Secondary | ICD-10-CM | POA: Diagnosis not present

## 2013-04-18 DIAGNOSIS — I739 Peripheral vascular disease, unspecified: Secondary | ICD-10-CM | POA: Diagnosis not present

## 2013-04-18 DIAGNOSIS — L608 Other nail disorders: Secondary | ICD-10-CM | POA: Diagnosis not present

## 2013-04-18 DIAGNOSIS — L84 Corns and callosities: Secondary | ICD-10-CM | POA: Diagnosis not present

## 2013-04-26 DIAGNOSIS — H40019 Open angle with borderline findings, low risk, unspecified eye: Secondary | ICD-10-CM | POA: Diagnosis not present

## 2013-04-26 DIAGNOSIS — H4320 Crystalline deposits in vitreous body, unspecified eye: Secondary | ICD-10-CM | POA: Diagnosis not present

## 2013-06-05 ENCOUNTER — Encounter: Payer: Medicare Other | Admitting: Internal Medicine

## 2013-06-12 DIAGNOSIS — H40019 Open angle with borderline findings, low risk, unspecified eye: Secondary | ICD-10-CM | POA: Diagnosis not present

## 2013-06-17 ENCOUNTER — Encounter: Payer: Medicare Other | Admitting: Family

## 2013-06-28 ENCOUNTER — Ambulatory Visit (INDEPENDENT_AMBULATORY_CARE_PROVIDER_SITE_OTHER): Payer: Medicare Other | Admitting: Internal Medicine

## 2013-06-28 ENCOUNTER — Encounter: Payer: Self-pay | Admitting: Internal Medicine

## 2013-06-28 VITALS — BP 120/80 | HR 72 | Temp 97.6°F | Resp 18 | Ht 67.25 in | Wt 119.0 lb

## 2013-06-28 DIAGNOSIS — R634 Abnormal weight loss: Secondary | ICD-10-CM

## 2013-06-28 DIAGNOSIS — I059 Rheumatic mitral valve disease, unspecified: Secondary | ICD-10-CM

## 2013-06-28 DIAGNOSIS — I341 Nonrheumatic mitral (valve) prolapse: Secondary | ICD-10-CM

## 2013-06-28 DIAGNOSIS — I10 Essential (primary) hypertension: Secondary | ICD-10-CM | POA: Diagnosis not present

## 2013-06-28 DIAGNOSIS — Z0000 Encounter for general adult medical examination without abnormal findings: Secondary | ICD-10-CM

## 2013-06-28 LAB — COMPREHENSIVE METABOLIC PANEL
ALK PHOS: 51 U/L (ref 39–117)
ALT: 29 U/L (ref 0–35)
AST: 33 U/L (ref 0–37)
Albumin: 3.9 g/dL (ref 3.5–5.2)
BILIRUBIN TOTAL: 0.9 mg/dL (ref 0.3–1.2)
BUN: 23 mg/dL (ref 6–23)
CO2: 38 mEq/L — ABNORMAL HIGH (ref 19–32)
Calcium: 9.4 mg/dL (ref 8.4–10.5)
Chloride: 101 mEq/L (ref 96–112)
Creatinine, Ser: 1.1 mg/dL (ref 0.4–1.2)
GFR: 62.15 mL/min (ref >60.00)
Glucose, Bld: 71 mg/dL (ref 70–99)
Potassium: 3.5 mEq/L (ref 3.5–5.1)
SODIUM: 142 meq/L (ref 135–145)
TOTAL PROTEIN: 7 g/dL (ref 6.0–8.3)

## 2013-06-28 NOTE — Patient Instructions (Signed)
Take a calcium supplement, plus [phone] units of vitamin D    It is important that you exercise regularly, at least 20 minutes 3 to 4 times per week.  If you develop chest pain or shortness of breath seek  medical attention.  Please check your blood pressure on a regular basis.  If it is consistently greater than 150/90, please make an office appointment.  Limit your sodium (Salt) intake  Return in one year for follow-up

## 2013-06-28 NOTE — Progress Notes (Signed)
Subjective:    Patient ID: [name] [name], female    DOB: 1931-07-10, 80 y.o.   MRN: [id number]  HPI   Wt Readings from Last 3 Encounters:  06/28/13 119 lb (53.978 kg)  12/03/12 114 lb (51.71 kg)  06/04/12 122 lb (55.65 kg)    80 -year-old patient who is seen today for an annual exam. She relocated from [place].  She has an approximate 10 year history of hypertension but has been off diuretic therapy for over one week. Blood pressure today was well controlled. She has no concerns or complaints. She does have a history of mitral valve prolapse. In 2006 he underwent a transsphenoidal resection of a pituitary tumor  due to visual problems. Apparently there were no endocrine issues. She does have a remote hysterectomy complete in 1974 due to benign disease. Some concerns about weight loss, but over the past 8 months.  Her weight is up 5 pounds.  She has been followed closely by ophthalmology following cataract extraction surgery about one year ago.  She still complains of a foreign body sensation involving the eyes, but otherwise doing well.  Family history mother died in her eighties of complications of senile dementia.  Father died in his 21.  Perioperatively, apparently from cardiac surgery.  She has a number of half brothers and sisters in reasonably good health.  Past Medical History  Diagnosis Date  . Hypertension   . Chicken pox     as a child  . Mumps     as a child  . MVP (mitral valve prolapse)     History   Social History  . Marital Status: Widowed    Spouse Name: N/A    Number of Children: N/A  . Years of Education: N/A   Occupational History  . Not on file.   Social History Main Topics  . Smoking status: Never Smoker   . Smokeless tobacco: Never Used  . Alcohol Use: Yes     Comment: rarley  . Drug Use: No  . Sexual Activity: Not on file   Other Topics Concern  . Not on file   Social History Narrative   Retired since 2004. [employer] middle  school in [place] masters degree widowed.  re located to [place]    Past Surgical History  Procedure Laterality Date  . Abdominal hysterectomy    . 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 Tonsillectomy and adenoidectomy    . Eye surgery    . Cataract extraction Right 11 2013    Family History  Problem Relation Age of Onset  . Diabetes Sister   . Hypertension Sister   . Hypertension Brother   . Diabetes Brother     No Known Allergies  Current Outpatient Prescriptions on File Prior to Visit  Medication Sig Dispense Refill  . hydrochlorothiazide (HYDRODIURIL) 25 MG tablet Take 1 tablet (25 mg total) by mouth daily.  30 tablet  1  . KLOR-CON M20 20 MEQ tablet TAKE 1 TABLET DAILY  90 tablet  3   No current facility-administered medications on file prior to visit.    BP 120/80  Pulse 72  Temp(Src) 97.6 F (36.4 C) (Oral)  Resp 18  Ht 5' 7.25" (1.708 m)  Wt 119 lb (53.978 kg)  BMI 18.50 kg/m2  SpO2 95%     1. Risk factors, based on past  M,S,F history- cardiovascular risk factors include hypertension  2.  Physical  activities: Remains active without exercise limitations.  Participates in martial arts exercises twice weekly  3.  Depression/mood: No history of depression or mood disorder  4.  Hearing: No deficits 5.  ADL's: Independent in all aspects of daily living 6.  Fall risk: Low  7.  Home safety: No problems identified  8.  Height weight, and visual acuity; height weight stable. There has been an approximate 12 pound weight loss over the past few years due to change in eating habits 9.  Counseling: Calcium and vitamin D encouraged 10. Lab orders based on risk factors: Will check a laboratory screen including TSH  11. Referral : Not appropriate at this time  12. Care plan: Heart healthy diet exercise calcium and vitamin D all encouraged. She was encouraged to followup with ophthalmology  13. Cognitive assessment: Alert and oriented with  normal affect no cognitive dysfunction      Review of Systems  Constitutional: Negative for fever, appetite change, fatigue and unexpected weight change.  HENT: Negative for congestion, dental problem, ear pain, hearing loss, mouth sores, nosebleeds, sinus pressure, sore throat, tinnitus, trouble swallowing and voice change.   Eyes: Negative for photophobia, pain, redness and visual disturbance.  Respiratory: Negative for cough, chest tightness and shortness of breath.   Cardiovascular: Negative for chest pain, palpitations and leg swelling.  Gastrointestinal: Negative for nausea, vomiting, abdominal pain, diarrhea, constipation, blood in stool, abdominal distention and rectal pain.  Genitourinary: Negative for dysuria, urgency, frequency, hematuria, flank pain, vaginal bleeding, vaginal discharge, difficulty urinating, genital sores, vaginal pain, menstrual problem and pelvic pain.  Musculoskeletal: Negative for arthralgias, back pain and neck stiffness.  Skin: Negative for rash.  Neurological: Negative for dizziness, syncope, speech difficulty, weakness, light-headedness, numbness and headaches.  Hematological: Negative for adenopathy. Does not bruise/bleed easily.  Psychiatric/Behavioral: Negative for suicidal ideas, behavioral problems, self-injury, dysphoric mood and agitation. The patient is not nervous/anxious.        Objective:   Physical Exam  Constitutional: She is oriented to person, place, and time. She appears well-developed and well-nourished.  HENT:  Head: Normocephalic and atraumatic.  Right Ear: External ear normal.  Left Ear: External ear normal.  Mouth/Throat: Oropharynx is clear and moist.  Eyes: Conjunctivae and EOM are normal.  Neck: Normal range of motion. Neck supple. No JVD present. No thyromegaly present.  Cardiovascular: Normal rate, regular rhythm, normal heart sounds and intact distal pulses.   No murmur heard. Pulmonary/Chest: Effort normal and breath  sounds normal. She has no wheezes. She has no rales.  Abdominal: Soft. Bowel sounds are normal. She exhibits no distension and no mass. There is no tenderness. There is no rebound and no guarding.  Musculoskeletal: Normal range of motion. She exhibits no edema and no tenderness.  Neurological: She is alert and oriented to person, place, and time. She has normal reflexes. No cranial nerve deficit. She exhibits normal muscle tone. Coordination normal.  Skin: Skin is warm and dry. No rash noted.  Psychiatric: She has a normal mood and affect. Her behavior is normal.          Assessment & Plan:  Unremarkable clinical examination History of weight loss.  Appears to be stable History of mild elevated transaminases.  Hepatitis C antibody and an abdominal ultrasound unremarkable, with normal liver parenchyma.  We'll check LFTs Status post transsphenoidal resection of a benign pituitary tumor History of hypertension.  Stable  Patient has been asked to obtain a home blood pressure monitor and check on blood pressure  readings. Calcium and vitamin D supplements encouraged she did have a colonoscopy in 2011 as well as a mammogram in October of 2011.   status post complete hysterectomy in 1974  Recheck in 12 months  Laboratory screen will be reviewed

## 2013-06-28 NOTE — Progress Notes (Signed)
Pre-visit discussion using our clinic review tool. No additional management support is needed unless otherwise documented below in the visit note.  

## 2013-07-01 ENCOUNTER — Telehealth: Payer: Self-pay | Admitting: Internal Medicine

## 2013-07-01 NOTE — Telephone Encounter (Signed)
Relevant patient education mailed to patient.  

## 2013-07-11 DIAGNOSIS — L608 Other nail disorders: Secondary | ICD-10-CM | POA: Diagnosis not present

## 2013-07-11 DIAGNOSIS — L84 Corns and callosities: Secondary | ICD-10-CM | POA: Diagnosis not present

## 2013-07-11 DIAGNOSIS — I739 Peripheral vascular disease, unspecified: Secondary | ICD-10-CM | POA: Diagnosis not present

## 2013-07-16 ENCOUNTER — Encounter: Payer: Self-pay | Admitting: Internal Medicine

## 2013-07-16 ENCOUNTER — Ambulatory Visit (INDEPENDENT_AMBULATORY_CARE_PROVIDER_SITE_OTHER): Payer: Medicare Other | Admitting: Internal Medicine

## 2013-07-16 VITALS — BP 146/90 | Temp 97.8°F | Ht 67.25 in | Wt 121.0 lb

## 2013-07-16 DIAGNOSIS — IMO0002 Reserved for concepts with insufficient information to code with codable children: Secondary | ICD-10-CM | POA: Diagnosis not present

## 2013-07-16 DIAGNOSIS — L089 Local infection of the skin and subcutaneous tissue, unspecified: Secondary | ICD-10-CM | POA: Diagnosis not present

## 2013-07-16 DIAGNOSIS — F988 Other specified behavioral and emotional disorders with onset usually occurring in childhood and adolescence: Secondary | ICD-10-CM | POA: Insufficient documentation

## 2013-07-16 MED ORDER — MUPIROCIN CALCIUM 2 % EX CREA: 1.0000 "application " | TOPICAL_CREAM | Freq: Three times a day (TID) | CUTANEOUS | Status: DC

## 2013-07-16 NOTE — Progress Notes (Signed)
Chief Complaint  Patient presents with  . Cut on right thumb    Ongoing for 2 weeks.    HPI: Patient comes in today for SDA for  new problem evaluation. pcp dr Raliegh Ip had wellness check on 3 20 Patient is very worried about some cracking soreness area looking like a cut on her right thumb. She's never had this before. It is getting a bit better today. Denies specific trauma or chemical irritation to his new however she does bite her nails. She has used some topical cortisone. No redness or weeping. ROS: See pertinent positives and negatives per HPI.  Past Medical History  Diagnosis Date  . Hypertension   . Chicken pox     as a child  . Mumps     as a child  . MVP (mitral valve prolapse)     Family History  Problem Relation Age of Onset  . Diabetes Sister   . Hypertension Sister   . Hypertension Brother   . Diabetes Brother     History   Social History  . Marital Status: Widowed    Spouse Name: N/A    Number of Children: N/A  . Years of Education: N/A   Social History Main Topics  . Smoking status: Never Smoker   . Smokeless tobacco: Never Used  . Alcohol Use: Yes     Comment: rarley  . Drug Use: No  . Sexual Activity: None   Other Topics Concern  . None   Social History Narrative   Retired since 2004. [employer] middle school in [place] masters degree widowed.  re located to [place]    Outpatient Encounter Prescriptions as of 07/16/2013  Medication Sig  . hydrochlorothiazide (HYDRODIURIL) 25 MG tablet Take 1 tablet (25 mg total) by mouth daily.  Marland Kitchen KLOR-CON M20 20 MEQ tablet TAKE 1 TABLET DAILY  . mupirocin cream (BACTROBAN) 2 % Apply 1 application topically 3 (three) times daily. To thumb until healed    EXAM:  BP 146/90  Temp(Src) 97.8 F (36.6 C) (Oral)  Ht 5' 7.25" (1.708 m)  Wt 121 lb (54.885 kg)  BMI 18.81 kg/m2  Body mass index is 18.81 kg/(m^2).  GENERAL: vitals reviewed and listed above, alert, oriented, appears well  hydrated and in no acute distress MS: moves all extremities without noticeable focal  Abnormality Examination of her nails show shortened nails no skin abrasion except on the right thumb there is some cracking perpendicular to the nail without redness discharge or blood. They're to cracks nail appears minimally to not thickened. No skin rash or dermatitis otherwise PSYCH: pleasant and cooperative,  ASSESSMENT AND PLAN:  Discussed the following assessment and plan:  Finger infection - Minimal healing possible dermatitis with secondary infection resolving discussed all possibilities topical antibiotic and smoking follow up if needed  Nail biting - for years   -Patient advised to return or notify health care team  if symptoms worsen ,persist or new concerns arise.  Patient Instructions  This acts like a mild infection that  Began after some skin irritation . Soak in warm soapy water and dry  Well but gently 3 x per day   Apply antibiotic cream over the area and nail after soaking . Can cover the area   Until healed . Do not bite this fingernail    ( to help it heal quickly )  If  Getting red and worse contacts [place]  Consider pill  Antibiotic Expect resolution in 1-2  weeks  Fu if needed     .      Standley Brooking. Defne Gerling M.D.  Pre visit review using our clinic review tool, if applicable. No additional management support is needed unless otherwise documented below in the visit note.

## 2013-07-16 NOTE — Patient Instructions (Signed)
This acts like a mild infection that  Began after some skin irritation . Soak in warm soapy water and dry  Well but gently 3 x per day   Apply antibiotic cream over the area and nail after soaking . Can cover the area   Until healed . Do not bite this fingernail    ( to help it heal quickly )  If  Getting red and worse contacts [place]  Consider pill  Antibiotic Expect resolution in 1-2 weeks  Fu if needed     .

## 2013-09-20 ENCOUNTER — Other Ambulatory Visit: Payer: Self-pay | Admitting: Internal Medicine

## 2013-10-03 DIAGNOSIS — I739 Peripheral vascular disease, unspecified: Secondary | ICD-10-CM | POA: Diagnosis not present

## 2013-10-03 DIAGNOSIS — L608 Other nail disorders: Secondary | ICD-10-CM | POA: Diagnosis not present

## 2013-10-03 DIAGNOSIS — L84 Corns and callosities: Secondary | ICD-10-CM | POA: Diagnosis not present

## 2013-11-07 DIAGNOSIS — M659 Synovitis and tenosynovitis, unspecified: Secondary | ICD-10-CM | POA: Diagnosis not present

## 2013-11-07 DIAGNOSIS — D2370 Other benign neoplasm of skin of unspecified lower limb, including hip: Secondary | ICD-10-CM | POA: Diagnosis not present

## 2013-11-07 DIAGNOSIS — M7150 Other bursitis, not elsewhere classified, unspecified site: Secondary | ICD-10-CM | POA: Diagnosis not present

## 2013-11-07 DIAGNOSIS — M12279 Villonodular synovitis (pigmented), unspecified ankle and foot: Secondary | ICD-10-CM | POA: Diagnosis not present

## 2013-11-07 DIAGNOSIS — M65979 Unspecified synovitis and tenosynovitis, unspecified ankle and foot: Secondary | ICD-10-CM | POA: Diagnosis not present

## 2013-12-03 ENCOUNTER — Ambulatory Visit (INDEPENDENT_AMBULATORY_CARE_PROVIDER_SITE_OTHER): Payer: Medicare Other | Admitting: Internal Medicine

## 2013-12-03 ENCOUNTER — Telehealth: Payer: Self-pay | Admitting: Internal Medicine

## 2013-12-03 ENCOUNTER — Encounter: Payer: Self-pay | Admitting: Internal Medicine

## 2013-12-03 VITALS — BP 118/80 | HR 83 | Temp 97.8°F | Resp 18 | Ht 67.25 in | Wt 118.0 lb

## 2013-12-03 DIAGNOSIS — I10 Essential (primary) hypertension: Secondary | ICD-10-CM | POA: Diagnosis not present

## 2013-12-03 DIAGNOSIS — R600 Localized edema: Secondary | ICD-10-CM

## 2013-12-03 DIAGNOSIS — R609 Edema, unspecified: Secondary | ICD-10-CM

## 2013-12-03 DIAGNOSIS — R634 Abnormal weight loss: Secondary | ICD-10-CM | POA: Diagnosis not present

## 2013-12-03 LAB — CBC WITH DIFFERENTIAL/PLATELET
BASOS ABS: 0 10*3/uL (ref 0.0–0.1)
Basophils Relative: 0.8 % (ref 0.0–3.0)
EOS ABS: 0.1 10*3/uL (ref 0.0–0.7)
Eosinophils Relative: 2 % (ref 0.0–5.0)
HCT: 37.4 % (ref 36.0–46.0)
Hemoglobin: 12.3 g/dL (ref 12.0–15.0)
Lymphocytes Relative: 31.6 % (ref 12.0–46.0)
Lymphs Abs: 0.9 10*3/uL (ref 0.7–4.0)
MCHC: 32.8 g/dL (ref 30.0–36.0)
MCV: 91.2 fl (ref 78.0–100.0)
MONO ABS: 0.4 10*3/uL (ref 0.1–1.0)
Monocytes Relative: 12 % (ref 3.0–12.0)
NEUTROS PCT: 53.6 % (ref 43.0–77.0)
Neutro Abs: 1.6 10*3/uL (ref 1.4–7.7)
Platelets: 166 10*3/uL (ref 150.0–400.0)
RBC: 4.1 Mil/uL (ref 3.87–5.11)
RDW: 14.1 % (ref 11.5–15.5)
WBC: 3 10*3/uL — ABNORMAL LOW (ref 4.0–10.5)

## 2013-12-03 LAB — SEDIMENTATION RATE: Sed Rate: 8 mm/hr (ref 0–22)

## 2013-12-03 MED ORDER — HYDROCHLOROTHIAZIDE 25 MG PO TABS: ORAL_TABLET | ORAL | Status: DC

## 2013-12-03 MED ORDER — POTASSIUM CHLORIDE CRYS ER 20 MEQ PO TBCR: EXTENDED_RELEASE_TABLET | ORAL | Status: DC

## 2013-12-03 NOTE — Progress Notes (Signed)
Subjective:    Patient ID: [name] [name], female    DOB: 24-Aug-1931, 80 y.o.   MRN: [id number]  HPI  80 year old patient who is seen today in followup.  Complaints today include a two-month history of pedal edema.  She has been off hydrochlorothiazide for about one month.  During this period of time.  She is transitioning from a home to a senior assisted-living facility.  She states that she was simply too busy to take her medication.  She also complains a white loss, which has been a concern in the past.  Denies any foot pain. Laboratory studies have revealed a mild chronic elevation of transaminases.  She states that she eats well and does have 3 meals prepared daily at the assisted-living facility. The patient did have a preventive health examination in March of this year:  Unremarkable clinical examination  History of weight loss. Appears to be stable  History of mild elevated transaminases. Hepatitis C antibody and an abdominal ultrasound unremarkable, with normal liver parenchyma. 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 Stable  Patient has been asked to obtain a home blood pressure monitor and check on blood pressure readings. Calcium and vitamin D supplements encouraged she did have a colonoscopy in 2011 as well as a mammogram in October of 2011. status post complete hysterectomy in 1974   Past Medical History  Diagnosis Date  . Hypertension   . Chicken pox     as a child  . Mumps     as a child  . MVP (mitral valve prolapse)     History   Social History  . Marital Status: Widowed    Spouse Name: N/A    Number of Children: N/A  . Years of Education: N/A   Occupational History  . Not on file.   Social History Main Topics  . Smoking status: Never Smoker   . Smokeless tobacco: Never Used  . Alcohol Use: Yes     Comment: rarley  . Drug Use: No  . Sexual Activity: Not on file   Other Topics Concern   . Not on file   Social History Narrative   Retired since 2004. Thought middle school in [place] masters degree widowed.  re located to [place]    Past Surgical History  Procedure Laterality Date  . Abdominal hysterectomy    . 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 Tonsillectomy and adenoidectomy    . Eye surgery    . Cataract extraction Right 11 2013    Family History  Problem Relation Age of Onset  . Diabetes Sister   . Hypertension Sister   . Hypertension Brother   . Diabetes Brother     No Known Allergies  Current Outpatient Prescriptions on File Prior to Visit  Medication Sig Dispense Refill  . hydrochlorothiazide (HYDRODIURIL) 25 MG tablet TAKE 1 TABLET BY MOUTH EVERY DAY  30 tablet  9  . KLOR-CON M20 20 MEQ tablet TAKE 1 TABLET DAILY  90 tablet  3   No current facility-administered medications on file prior to visit.    BP 118/80  Pulse 83  Temp(Src) 97.8 F (36.6 C) (Oral)  Resp 18  Ht 5' 7.25" (1.708 m)  Wt 118 lb (53.524 kg)  BMI 18.35 kg/m2  SpO2 99%     Review of Systems  Constitutional: Positive for unexpected weight change.  HENT: Negative for  congestion, dental problem, hearing loss, rhinorrhea, sinus pressure, sore throat and tinnitus.   Eyes: Negative for pain, discharge and visual disturbance.  Respiratory: Negative for cough and shortness of breath.   Cardiovascular: Negative for chest pain, palpitations and leg swelling.  Gastrointestinal: Negative for nausea, vomiting, abdominal pain, diarrhea, constipation, blood in stool and abdominal distention.  Genitourinary: Negative for dysuria, urgency, frequency, hematuria, flank pain, vaginal bleeding, vaginal discharge, difficulty urinating, vaginal pain and pelvic pain.  Musculoskeletal: Negative for arthralgias, gait problem and joint swelling.  Skin: Negative for rash.  Neurological: Negative for dizziness, syncope, speech difficulty, weakness, numbness and headaches.    Hematological: Negative for adenopathy.  Psychiatric/Behavioral: Negative for behavioral problems, dysphoric mood and agitation. The patient is not nervous/anxious.        Objective:   Physical Exam  Constitutional: She is oriented to person, place, and time. She appears well-developed and well-nourished.  Thin Blood pressure 118/74  Weight 118  HENT:  Head: Normocephalic.  Right Ear: External ear normal.  Left Ear: External ear normal.  Mouth/Throat: Oropharynx is clear and moist.  Eyes: Conjunctivae and EOM are normal. Pupils are equal, round, and reactive to light.  Neck: Normal range of motion. Neck supple. No thyromegaly present.  Cardiovascular: Normal rate, regular rhythm and normal heart sounds.    Dorsalis pedis pulses full Posterior tibial pulses not easily palpable Plus 2 pedal edema  Pulmonary/Chest: Effort normal and breath sounds normal.  Abdominal: Soft. Bowel sounds are normal. She exhibits no mass. There is no tenderness.  Musculoskeletal: Normal range of motion. She exhibits edema.  Bilateral hallux valgus deformities Slight discoloration of the left third toe, but no ischemic changes Toes are all well perfused No ulcers  Lymphadenopathy:    She has no cervical adenopathy.  Neurological: She is alert and oriented to person, place, and time.  Skin: Skin is warm and dry. No rash noted.  Psychiatric: She has a normal mood and affect. Her behavior is normal.          Assessment & Plan:    Pedal edema -This has worsened since discontinuation of hydrochlorothiazide.  We'll resume along with potassium supplementation.  Low salt diet encouraged Normotensive History of weight loss.  No significant recent documented weight loss History of elevated transaminases.  We'll repeat

## 2013-12-03 NOTE — Progress Notes (Signed)
Pre visit review using our clinic review tool, if applicable. No additional management support is needed unless otherwise documented below in the visit note. 

## 2013-12-03 NOTE — Progress Notes (Signed)
   Subjective:    Patient ID: [name] [name], female    DOB: 03-Feb-1932, 80 y.o.   MRN: [id number]  HPI  Wt Readings from Last 3 Encounters:  12/03/13 118 lb (53.524 kg)  07/16/13 121 lb (54.885 kg)  06/28/13 119 lb (53.978 kg)     Review of Systems     Objective:   Physical Exam        Assessment & Plan:

## 2013-12-03 NOTE — Telephone Encounter (Signed)
Pt was seen today and states she forgot to tell dr. Raliegh Ip she needed refills on her meds. Pt need new rx hydrochlorothiazide (HYDRODIURIL) 25 MG tablet  and klor-con-m20 20 MEQ send to [facility].

## 2013-12-03 NOTE — Patient Instructions (Addendum)
Limit your sodium (Salt) intake  Resume hydrochlorothiazide   Low-Sodium Eating Plan Sodium raises blood pressure and causes water to be held in the body. Getting less sodium from food will help lower your blood pressure, reduce any swelling, and protect your heart, liver, and kidneys. We get sodium by adding salt (sodium chloride) to food. Most of our sodium comes from canned, boxed, and frozen foods. Restaurant foods, fast foods, and pizza are also very high in sodium. Even if you take medicine to lower your blood pressure or to reduce fluid in your body, getting less sodium from your food is important. WHAT IS MY PLAN? Most people should limit their sodium intake to 2,300 mg a day. Your health care provider recommends that you limit your sodium intake to __________ a day.  WHAT DO I NEED TO KNOW ABOUT THIS EATING PLAN? For the low-sodium eating plan, you will follow these general guidelines:  Choose foods with a % Daily Value for sodium of less than 5% (as listed on the food label).   Use salt-free seasonings or herbs instead of table salt or sea salt.   Check with your health care provider or pharmacist before using salt substitutes.   Eat fresh foods.  Eat more vegetables and fruits.  Limit canned vegetables. If you do use them, rinse them well to decrease the sodium.   Limit cheese to 1 oz (28 g) per day.   Eat lower-sodium products, often labeled as "lower sodium" or "no salt added."  Avoid foods that contain monosodium glutamate (MSG). MSG is sometimes added to [place] food and some canned foods.  Check food labels (Nutrition Facts labels) on foods to learn how much sodium is in one serving.  Eat more home-cooked food and less restaurant, buffet, and fast food.  When eating at a restaurant, ask that your food be prepared with less salt or none, if possible.  HOW DO I READ FOOD LABELS FOR SODIUM INFORMATION? The Nutrition Facts label lists the amount of sodium in  one serving of the food. If you eat more than one serving, you must multiply the listed amount of sodium by the number of servings. Food labels may also identify foods as:  Sodium free--Less than 5 mg in a serving.  Very low sodium--35 mg or less in a serving.  Low sodium--140 mg or less in a serving.  Light in sodium--50% less sodium in a serving. For example, if a food that usually has 300 mg of sodium is changed to become light in sodium, it will have 150 mg of sodium.  Reduced sodium--25% less sodium in a serving. For example, if a food that usually has 400 mg of sodium is changed to reduced sodium, it will have 300 mg of sodium. WHAT FOODS CAN I EAT? Grains Low-sodium cereals, including oats, puffed wheat and rice, and shredded wheat cereals. Low-sodium crackers. Unsalted rice and pasta. Lower-sodium bread.  Vegetables Frozen or fresh vegetables. Low-sodium or reduced-sodium canned vegetables. Low-sodium or reduced-sodium tomato sauce and paste. Low-sodium or reduced-sodium tomato and vegetable juices.  Fruits Fresh, frozen, and canned fruit. Fruit juice.  Meat and Other Protein Products Low-sodium canned tuna and salmon. Fresh or frozen meat, poultry, seafood, and fish. Lamb. Unsalted nuts. Dried beans, peas, and lentils without added salt. Unsalted canned beans. Homemade soups without salt. Eggs.  Dairy Milk. Soy milk. Ricotta cheese. Low-sodium or reduced-sodium cheeses. Yogurt.  Condiments Fresh and dried herbs and spices. Salt-free seasonings. Onion and garlic powders. Low-sodium varieties  of mustard and ketchup. Lemon juice.  Fats and Oils Reduced-sodium salad dressings. Unsalted butter.  Other Unsalted popcorn and pretzels.  The items listed above may not be a complete list of recommended foods or beverages. Contact your dietitian for more options. WHAT FOODS ARE NOT RECOMMENDED? Grains Instant hot cereals. Bread stuffing, pancake, and biscuit mixes. Croutons.  Seasoned rice or pasta mixes. Noodle soup cups. Boxed or frozen macaroni and cheese. Self-rising flour. Regular salted crackers. Vegetables Regular canned vegetables. Regular canned tomato sauce and paste. Regular tomato and vegetable juices. Frozen vegetables in sauces. Salted french fries. Olives. Angie Fava. Relishes. Sauerkraut. Salsa. Meat and Other Protein Products Salted, canned, smoked, spiced, or pickled meats, seafood, or fish. Bacon, ham, sausage, hot dogs, corned beef, chipped beef, and packaged luncheon meats. Salt pork. Jerky. Pickled herring. Anchovies, regular canned tuna, and sardines. Salted nuts. Dairy Processed cheese and cheese spreads. Cheese curds. Blue cheese and cottage cheese. Buttermilk.  Condiments Onion and garlic salt, seasoned salt, table salt, and sea salt. Canned and packaged gravies. Worcestershire sauce. Tartar sauce. Barbecue sauce. Teriyaki sauce. Soy sauce, including reduced sodium. Steak sauce. Fish sauce. Oyster sauce. Cocktail sauce. Horseradish. Regular ketchup and mustard. Meat flavorings and tenderizers. Bouillon cubes. Hot sauce. Tabasco sauce. Marinades. Taco seasonings. Relishes. Fats and Oils Regular salad dressings. Salted butter. Margarine. Ghee. Bacon fat.  Other Potato and tortilla chips. Corn chips and puffs. Salted popcorn and pretzels. Canned or dried soups. Pizza. Frozen entrees and pot pies.  The items listed above may not be a complete list of foods and beverages to avoid. Contact your dietitian for more information. Document Released: 09/17/2001 Document Revised: 04/02/2013 Document Reviewed: 01/30/2013 [facility] Patient Information 2015 [place], [place]. This information is not intended to replace advice given to you by your health care provider. Make sure you discuss any questions you have with your health care provider. Chest x-ray as discussed

## 2013-12-03 NOTE — Telephone Encounter (Signed)
Rx's sent to pharmacy.  

## 2013-12-04 LAB — TSH: TSH: 1.88 u[IU]/mL (ref 0.35–4.50)

## 2013-12-04 LAB — COMPREHENSIVE METABOLIC PANEL
ALBUMIN: 3.5 g/dL (ref 3.5–5.2)
ALT: 99 U/L — AB (ref 0–35)
AST: 57 U/L — ABNORMAL HIGH (ref 0–37)
Alkaline Phosphatase: 62 U/L (ref 39–117)
BUN: 20 mg/dL (ref 6–23)
CO2: 32 meq/L (ref 19–32)
CREATININE: 1.1 mg/dL (ref 0.4–1.2)
Calcium: 8.9 mg/dL (ref 8.4–10.5)
Chloride: 105 mEq/L (ref 96–112)
GFR: 62.75 mL/min (ref >60.00)
Glucose, Bld: 66 mg/dL — ABNORMAL LOW (ref 70–99)
Potassium: 4.4 mEq/L (ref 3.5–5.1)
SODIUM: 142 meq/L (ref 135–145)
TOTAL PROTEIN: 6.3 g/dL (ref 6.0–8.3)
Total Bilirubin: 0.4 mg/dL (ref 0.2–1.2)

## 2013-12-05 ENCOUNTER — Ambulatory Visit (INDEPENDENT_AMBULATORY_CARE_PROVIDER_SITE_OTHER)
Admission: RE | Admit: 2013-12-05 | Discharge: 2013-12-05 | Disposition: A | Discharge status: Home / Self Care | Payer: Medicare Other | Source: Ambulatory Visit | Attending: Internal Medicine | Admitting: Internal Medicine

## 2013-12-05 DIAGNOSIS — R634 Abnormal weight loss: Secondary | ICD-10-CM | POA: Diagnosis not present

## 2013-12-05 DIAGNOSIS — R609 Edema, unspecified: Secondary | ICD-10-CM | POA: Diagnosis not present

## 2013-12-05 DIAGNOSIS — R600 Localized edema: Secondary | ICD-10-CM

## 2013-12-05 DIAGNOSIS — I10 Essential (primary) hypertension: Secondary | ICD-10-CM

## 2013-12-10 ENCOUNTER — Encounter: Payer: Self-pay | Admitting: *Deleted

## 2013-12-12 DIAGNOSIS — Z23 Encounter for immunization: Secondary | ICD-10-CM | POA: Diagnosis not present

## 2013-12-17 ENCOUNTER — Encounter: Payer: Self-pay | Admitting: *Deleted

## 2013-12-26 DIAGNOSIS — L84 Corns and callosities: Secondary | ICD-10-CM | POA: Diagnosis not present

## 2013-12-26 DIAGNOSIS — I739 Peripheral vascular disease, unspecified: Secondary | ICD-10-CM | POA: Diagnosis not present

## 2013-12-26 DIAGNOSIS — L608 Other nail disorders: Secondary | ICD-10-CM | POA: Diagnosis not present

## 2014-02-03 ENCOUNTER — Ambulatory Visit (INDEPENDENT_AMBULATORY_CARE_PROVIDER_SITE_OTHER): Payer: Medicare Other | Admitting: Internal Medicine

## 2014-02-03 ENCOUNTER — Encounter: Payer: Self-pay | Admitting: Internal Medicine

## 2014-02-03 ENCOUNTER — Ambulatory Visit: Payer: Medicare Other | Admitting: Internal Medicine

## 2014-02-03 VITALS — BP 158/70 | Temp 97.6°F | Ht 67.25 in | Wt 122.0 lb

## 2014-02-03 DIAGNOSIS — Z23 Encounter for immunization: Secondary | ICD-10-CM | POA: Diagnosis not present

## 2014-02-03 NOTE — Progress Notes (Signed)
Pre visit review using our clinic review tool, if applicable. No additional management support is needed unless otherwise documented below in the visit note. 

## 2014-02-03 NOTE — Progress Notes (Signed)
Subjective:    Patient ID: [name] [name], female    DOB: 1932-04-10, 80 y.o.   MRN: [id number]  HPI Wt Readings from Last 3 Encounters:  02/03/14 122 lb (55.339 kg)  12/03/13 118 lb (53.524 kg)  07/16/13 121 lb (54.68 kg)   80 year old patient who is seen today for follow-up.  She has hypertension treated with diuretic therapy.  She has had some mild pedal edema that worsens throughout the day, but resolves by morning. There has been concerned about weight loss, but this has been stable for over one year.  Recent laboratory studies were reviewed and were unremarkable except for stable chronic mild LFT elevation She generally feels well today.  She states her appetite is doing well No new concerns or complaints.  She is scheduled for dental extractions soon Past Medical History  Diagnosis Date  . Hypertension   . Chicken pox     as a child  . Mumps     as a child  . MVP (mitral valve prolapse)     History   Social History  . Marital Status: Widowed    Spouse Name: N/A    Number of Children: N/A  . Years of Education: N/A   Occupational History  . Not on file.   Social History Main Topics  . Smoking status: Never Smoker   . Smokeless tobacco: Never Used  . Alcohol Use: Yes     Comment: rarley  . Drug Use: No  . Sexual Activity: Not on file   Other Topics Concern  . Not on file   Social History Narrative   Retired since 2004. Thought middle school in [place] masters degree widowed.  re located to [place]    Past Surgical History  Procedure Laterality Date  . Abdominal hysterectomy    . 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 Tonsillectomy and adenoidectomy    . Eye surgery    . Cataract extraction Right 11 2013    Family History  Problem Relation Age of Onset  . Diabetes Sister   . Hypertension Sister   . Hypertension Brother   . Diabetes Brother     No Known Allergies  Current Outpatient Prescriptions on File Prior to Visit    Medication Sig Dispense Refill  . hydrochlorothiazide (HYDRODIURIL) 25 MG tablet TAKE 1 TABLET BY MOUTH EVERY DAY  90 tablet  3  . potassium chloride SA (KLOR-CON M20) 20 MEQ tablet TAKE 1 TABLET DAILY  90 tablet  3   No current facility-administered medications on file prior to visit.    BP 158/70  Temp(Src) 97.6 F (36.4 C) (Oral)  Ht 5' 7.25" (1.708 m)  Wt 122 lb (55.339 kg)  BMI 18.97 kg/m2     Review of Systems  Constitutional: Negative.   HENT: Negative for congestion, dental problem, hearing loss, rhinorrhea, sinus pressure, sore throat and tinnitus.   Eyes: Negative for pain, discharge and visual disturbance.  Respiratory: Negative for cough and shortness of breath.   Cardiovascular: Positive for leg swelling. Negative for chest pain and palpitations.  Gastrointestinal: Negative for nausea, vomiting, abdominal pain, diarrhea, constipation, blood in stool and abdominal distention.  Genitourinary: Negative for dysuria, urgency, frequency, hematuria, flank pain, vaginal bleeding, vaginal discharge, difficulty urinating, vaginal pain and pelvic pain.  Musculoskeletal: Negative for arthralgias, gait problem and joint swelling.  Skin: Negative for rash.  Neurological: Negative for dizziness, syncope, speech difficulty, weakness, numbness and headaches.  Hematological: Negative for adenopathy.  Psychiatric/Behavioral: Negative for behavioral problems, dysphoric mood and agitation. The patient is not nervous/anxious.        Objective:   Physical Exam  Constitutional: She is oriented to person, place, and time. She appears well-developed and well-nourished.  Blood pressure 140/70  HENT:  Head: Normocephalic.  Right Ear: External ear normal.  Left Ear: External ear normal.  Mouth/Throat: Oropharynx is clear and moist.  Eyes: Conjunctivae and EOM are normal. Pupils are equal, round, and reactive to light.  Neck: Normal range of motion. Neck supple. No thyromegaly present.   Cardiovascular: Normal rate, regular rhythm, normal heart sounds and intact distal pulses.   Pulmonary/Chest: Effort normal and breath sounds normal.  Abdominal: Soft. Bowel sounds are normal. She exhibits no mass. There is no tenderness.  Musculoskeletal: Normal range of motion.  Trace ankle edema  Lymphadenopathy:    She has no cervical adenopathy.  Neurological: She is alert and oriented to person, place, and time.  Skin: Skin is warm and dry. No rash noted.  Psychiatric: She has a normal mood and affect. Her behavior is normal.          Assessment & Plan:   Hypertension well controlled.  Continue low-salt diet Mild pedal edema, stable History of weight loss, stable  Recheck 6 months or as needed

## 2014-02-03 NOTE — Patient Instructions (Signed)
Limit your sodium (Salt) intake  Please check your blood pressure on a regular basis.  If it is consistently greater than 150/90, please make an office appointment.  Return in 6 months for follow-up   

## 2014-03-04 DIAGNOSIS — M5136 Other intervertebral disc degeneration, lumbar region: Secondary | ICD-10-CM | POA: Diagnosis not present

## 2014-03-04 DIAGNOSIS — M545 Low back pain: Secondary | ICD-10-CM | POA: Diagnosis not present

## 2014-03-04 DIAGNOSIS — M9903 Segmental and somatic dysfunction of lumbar region: Secondary | ICD-10-CM | POA: Diagnosis not present

## 2014-03-04 DIAGNOSIS — M47816 Spondylosis without myelopathy or radiculopathy, lumbar region: Secondary | ICD-10-CM | POA: Diagnosis not present

## 2014-03-18 DIAGNOSIS — M545 Low back pain: Secondary | ICD-10-CM | POA: Diagnosis not present

## 2014-03-18 DIAGNOSIS — M5136 Other intervertebral disc degeneration, lumbar region: Secondary | ICD-10-CM | POA: Diagnosis not present

## 2014-03-18 DIAGNOSIS — M9903 Segmental and somatic dysfunction of lumbar region: Secondary | ICD-10-CM | POA: Diagnosis not present

## 2014-03-18 DIAGNOSIS — M47816 Spondylosis without myelopathy or radiculopathy, lumbar region: Secondary | ICD-10-CM | POA: Diagnosis not present

## 2014-03-31 DIAGNOSIS — I739 Peripheral vascular disease, unspecified: Secondary | ICD-10-CM | POA: Diagnosis not present

## 2014-03-31 DIAGNOSIS — L603 Nail dystrophy: Secondary | ICD-10-CM | POA: Diagnosis not present

## 2014-03-31 DIAGNOSIS — L84 Corns and callosities: Secondary | ICD-10-CM | POA: Diagnosis not present

## 2014-04-01 DIAGNOSIS — M545 Low back pain: Secondary | ICD-10-CM | POA: Diagnosis not present

## 2014-04-01 DIAGNOSIS — M5136 Other intervertebral disc degeneration, lumbar region: Secondary | ICD-10-CM | POA: Diagnosis not present

## 2014-04-01 DIAGNOSIS — M47816 Spondylosis without myelopathy or radiculopathy, lumbar region: Secondary | ICD-10-CM | POA: Diagnosis not present

## 2014-04-01 DIAGNOSIS — M9903 Segmental and somatic dysfunction of lumbar region: Secondary | ICD-10-CM | POA: Diagnosis not present

## 2014-04-07 DIAGNOSIS — H40023 Open angle with borderline findings, high risk, bilateral: Secondary | ICD-10-CM | POA: Diagnosis not present

## 2014-04-22 DIAGNOSIS — M47816 Spondylosis without myelopathy or radiculopathy, lumbar region: Secondary | ICD-10-CM | POA: Diagnosis not present

## 2014-04-22 DIAGNOSIS — M9903 Segmental and somatic dysfunction of lumbar region: Secondary | ICD-10-CM | POA: Diagnosis not present

## 2014-04-22 DIAGNOSIS — M5136 Other intervertebral disc degeneration, lumbar region: Secondary | ICD-10-CM | POA: Diagnosis not present

## 2014-04-22 DIAGNOSIS — M545 Low back pain: Secondary | ICD-10-CM | POA: Diagnosis not present

## 2014-05-06 DIAGNOSIS — M9903 Segmental and somatic dysfunction of lumbar region: Secondary | ICD-10-CM | POA: Diagnosis not present

## 2014-05-06 DIAGNOSIS — M5136 Other intervertebral disc degeneration, lumbar region: Secondary | ICD-10-CM | POA: Diagnosis not present

## 2014-05-06 DIAGNOSIS — M47816 Spondylosis without myelopathy or radiculopathy, lumbar region: Secondary | ICD-10-CM | POA: Diagnosis not present

## 2014-05-06 DIAGNOSIS — M545 Low back pain: Secondary | ICD-10-CM | POA: Diagnosis not present

## 2014-05-20 DIAGNOSIS — M5136 Other intervertebral disc degeneration, lumbar region: Secondary | ICD-10-CM | POA: Diagnosis not present

## 2014-05-20 DIAGNOSIS — M9903 Segmental and somatic dysfunction of lumbar region: Secondary | ICD-10-CM | POA: Diagnosis not present

## 2014-05-20 DIAGNOSIS — M545 Low back pain: Secondary | ICD-10-CM | POA: Diagnosis not present

## 2014-05-20 DIAGNOSIS — M47816 Spondylosis without myelopathy or radiculopathy, lumbar region: Secondary | ICD-10-CM | POA: Diagnosis not present

## 2014-06-12 DIAGNOSIS — L603 Nail dystrophy: Secondary | ICD-10-CM | POA: Diagnosis not present

## 2014-06-12 DIAGNOSIS — I739 Peripheral vascular disease, unspecified: Secondary | ICD-10-CM | POA: Diagnosis not present

## 2014-06-12 DIAGNOSIS — L84 Corns and callosities: Secondary | ICD-10-CM | POA: Diagnosis not present

## 2014-06-23 DIAGNOSIS — I10 Essential (primary) hypertension: Secondary | ICD-10-CM | POA: Diagnosis not present

## 2014-06-23 DIAGNOSIS — R413 Other amnesia: Secondary | ICD-10-CM | POA: Diagnosis not present

## 2014-06-23 DIAGNOSIS — R4586 Emotional lability: Secondary | ICD-10-CM | POA: Diagnosis not present

## 2014-06-23 DIAGNOSIS — F329 Major depressive disorder, single episode, unspecified: Secondary | ICD-10-CM | POA: Diagnosis not present

## 2014-06-28 DIAGNOSIS — Z1231 Encounter for screening mammogram for malignant neoplasm of breast: Secondary | ICD-10-CM | POA: Diagnosis not present

## 2014-06-28 LAB — HM MAMMOGRAPHY: HM MAMMO: NEGATIVE

## 2014-06-30 DIAGNOSIS — R4586 Emotional lability: Secondary | ICD-10-CM | POA: Diagnosis not present

## 2014-06-30 DIAGNOSIS — R413 Other amnesia: Secondary | ICD-10-CM | POA: Diagnosis not present

## 2014-06-30 DIAGNOSIS — I10 Essential (primary) hypertension: Secondary | ICD-10-CM | POA: Diagnosis not present

## 2014-06-30 DIAGNOSIS — F329 Major depressive disorder, single episode, unspecified: Secondary | ICD-10-CM | POA: Diagnosis not present

## 2014-07-01 ENCOUNTER — Telehealth: Payer: Self-pay | Admitting: Internal Medicine

## 2014-07-01 NOTE — Telephone Encounter (Signed)
FYI pt states her assisted living [facility] has their own physician and she will be using her from now on,  So far she likes this doc and will get her meds from them as well. Will call if that does not work out.

## 2014-07-01 NOTE — Telephone Encounter (Signed)
FYI

## 2014-07-02 ENCOUNTER — Encounter: Payer: Medicare Other | Admitting: Internal Medicine

## 2014-07-03 DIAGNOSIS — Z79899 Other long term (current) drug therapy: Secondary | ICD-10-CM | POA: Diagnosis not present

## 2014-07-21 ENCOUNTER — Encounter: Payer: Self-pay | Admitting: Internal Medicine

## 2014-07-21 DIAGNOSIS — F329 Major depressive disorder, single episode, unspecified: Secondary | ICD-10-CM | POA: Diagnosis not present

## 2014-07-21 DIAGNOSIS — R413 Other amnesia: Secondary | ICD-10-CM | POA: Diagnosis not present

## 2014-07-21 DIAGNOSIS — R4586 Emotional lability: Secondary | ICD-10-CM | POA: Diagnosis not present

## 2014-07-21 DIAGNOSIS — I10 Essential (primary) hypertension: Secondary | ICD-10-CM | POA: Diagnosis not present

## 2014-07-21 DIAGNOSIS — L989 Disorder of the skin and subcutaneous tissue, unspecified: Secondary | ICD-10-CM | POA: Diagnosis not present

## 2014-07-22 DIAGNOSIS — M545 Low back pain: Secondary | ICD-10-CM | POA: Diagnosis not present

## 2014-07-22 DIAGNOSIS — M5136 Other intervertebral disc degeneration, lumbar region: Secondary | ICD-10-CM | POA: Diagnosis not present

## 2014-07-22 DIAGNOSIS — M47816 Spondylosis without myelopathy or radiculopathy, lumbar region: Secondary | ICD-10-CM | POA: Diagnosis not present

## 2014-07-22 DIAGNOSIS — M9903 Segmental and somatic dysfunction of lumbar region: Secondary | ICD-10-CM | POA: Diagnosis not present

## 2014-08-04 ENCOUNTER — Ambulatory Visit: Payer: Medicare Other | Admitting: Internal Medicine

## 2014-08-05 DIAGNOSIS — M545 Low back pain: Secondary | ICD-10-CM | POA: Diagnosis not present

## 2014-08-05 DIAGNOSIS — M47816 Spondylosis without myelopathy or radiculopathy, lumbar region: Secondary | ICD-10-CM | POA: Diagnosis not present

## 2014-08-05 DIAGNOSIS — M5136 Other intervertebral disc degeneration, lumbar region: Secondary | ICD-10-CM | POA: Diagnosis not present

## 2014-08-05 DIAGNOSIS — M9903 Segmental and somatic dysfunction of lumbar region: Secondary | ICD-10-CM | POA: Diagnosis not present

## 2014-08-14 DIAGNOSIS — Z0000 Encounter for general adult medical examination without abnormal findings: Secondary | ICD-10-CM | POA: Diagnosis not present

## 2014-08-18 DIAGNOSIS — D72819 Decreased white blood cell count, unspecified: Secondary | ICD-10-CM | POA: Diagnosis not present

## 2014-08-18 DIAGNOSIS — Z8659 Personal history of other mental and behavioral disorders: Secondary | ICD-10-CM | POA: Diagnosis not present

## 2014-08-18 DIAGNOSIS — Z23 Encounter for immunization: Secondary | ICD-10-CM | POA: Diagnosis not present

## 2014-08-18 DIAGNOSIS — F329 Major depressive disorder, single episode, unspecified: Secondary | ICD-10-CM | POA: Diagnosis not present

## 2014-08-18 DIAGNOSIS — G309 Alzheimer's disease, unspecified: Secondary | ICD-10-CM | POA: Diagnosis not present

## 2014-08-18 DIAGNOSIS — I10 Essential (primary) hypertension: Secondary | ICD-10-CM | POA: Diagnosis not present

## 2014-08-18 DIAGNOSIS — R5381 Other malaise: Secondary | ICD-10-CM | POA: Diagnosis not present

## 2014-08-19 DIAGNOSIS — M47816 Spondylosis without myelopathy or radiculopathy, lumbar region: Secondary | ICD-10-CM | POA: Diagnosis not present

## 2014-08-19 DIAGNOSIS — M5136 Other intervertebral disc degeneration, lumbar region: Secondary | ICD-10-CM | POA: Diagnosis not present

## 2014-08-19 DIAGNOSIS — M545 Low back pain: Secondary | ICD-10-CM | POA: Diagnosis not present

## 2014-08-19 DIAGNOSIS — M9903 Segmental and somatic dysfunction of lumbar region: Secondary | ICD-10-CM | POA: Diagnosis not present

## 2014-08-28 DIAGNOSIS — L603 Nail dystrophy: Secondary | ICD-10-CM | POA: Diagnosis not present

## 2014-08-28 DIAGNOSIS — I739 Peripheral vascular disease, unspecified: Secondary | ICD-10-CM | POA: Diagnosis not present

## 2014-08-28 DIAGNOSIS — L84 Corns and callosities: Secondary | ICD-10-CM | POA: Diagnosis not present

## 2014-09-02 DIAGNOSIS — M9903 Segmental and somatic dysfunction of lumbar region: Secondary | ICD-10-CM | POA: Diagnosis not present

## 2014-09-02 DIAGNOSIS — M545 Low back pain: Secondary | ICD-10-CM | POA: Diagnosis not present

## 2014-09-02 DIAGNOSIS — M47816 Spondylosis without myelopathy or radiculopathy, lumbar region: Secondary | ICD-10-CM | POA: Diagnosis not present

## 2014-09-02 DIAGNOSIS — M5136 Other intervertebral disc degeneration, lumbar region: Secondary | ICD-10-CM | POA: Diagnosis not present

## 2014-09-03 DIAGNOSIS — I10 Essential (primary) hypertension: Secondary | ICD-10-CM | POA: Diagnosis not present

## 2014-09-03 DIAGNOSIS — Z79899 Other long term (current) drug therapy: Secondary | ICD-10-CM | POA: Diagnosis not present

## 2014-09-10 DIAGNOSIS — H1033 Unspecified acute conjunctivitis, bilateral: Secondary | ICD-10-CM | POA: Diagnosis not present

## 2014-09-12 DIAGNOSIS — G309 Alzheimer's disease, unspecified: Secondary | ICD-10-CM | POA: Diagnosis not present

## 2014-09-12 DIAGNOSIS — D72819 Decreased white blood cell count, unspecified: Secondary | ICD-10-CM | POA: Diagnosis not present

## 2014-09-12 DIAGNOSIS — I10 Essential (primary) hypertension: Secondary | ICD-10-CM | POA: Diagnosis not present

## 2014-09-12 DIAGNOSIS — F329 Major depressive disorder, single episode, unspecified: Secondary | ICD-10-CM | POA: Diagnosis not present

## 2014-09-23 DIAGNOSIS — M5136 Other intervertebral disc degeneration, lumbar region: Secondary | ICD-10-CM | POA: Diagnosis not present

## 2014-09-23 DIAGNOSIS — M545 Low back pain: Secondary | ICD-10-CM | POA: Diagnosis not present

## 2014-09-23 DIAGNOSIS — M9903 Segmental and somatic dysfunction of lumbar region: Secondary | ICD-10-CM | POA: Diagnosis not present

## 2014-09-23 DIAGNOSIS — M47816 Spondylosis without myelopathy or radiculopathy, lumbar region: Secondary | ICD-10-CM | POA: Diagnosis not present

## 2014-10-03 DIAGNOSIS — I10 Essential (primary) hypertension: Secondary | ICD-10-CM | POA: Diagnosis not present

## 2014-10-03 DIAGNOSIS — I341 Nonrheumatic mitral (valve) prolapse: Secondary | ICD-10-CM | POA: Diagnosis not present

## 2014-10-03 DIAGNOSIS — G309 Alzheimer's disease, unspecified: Secondary | ICD-10-CM | POA: Diagnosis not present

## 2014-10-03 DIAGNOSIS — F329 Major depressive disorder, single episode, unspecified: Secondary | ICD-10-CM | POA: Diagnosis not present

## 2014-10-03 DIAGNOSIS — R110 Nausea: Secondary | ICD-10-CM | POA: Diagnosis not present

## 2014-10-03 DIAGNOSIS — D72819 Decreased white blood cell count, unspecified: Secondary | ICD-10-CM | POA: Diagnosis not present

## 2014-10-21 DIAGNOSIS — M47816 Spondylosis without myelopathy or radiculopathy, lumbar region: Secondary | ICD-10-CM | POA: Diagnosis not present

## 2014-10-21 DIAGNOSIS — M545 Low back pain: Secondary | ICD-10-CM | POA: Diagnosis not present

## 2014-10-21 DIAGNOSIS — M5136 Other intervertebral disc degeneration, lumbar region: Secondary | ICD-10-CM | POA: Diagnosis not present

## 2014-10-21 DIAGNOSIS — M9903 Segmental and somatic dysfunction of lumbar region: Secondary | ICD-10-CM | POA: Diagnosis not present

## 2014-11-04 DIAGNOSIS — M9903 Segmental and somatic dysfunction of lumbar region: Secondary | ICD-10-CM | POA: Diagnosis not present

## 2014-11-04 DIAGNOSIS — M545 Low back pain: Secondary | ICD-10-CM | POA: Diagnosis not present

## 2014-11-04 DIAGNOSIS — M47816 Spondylosis without myelopathy or radiculopathy, lumbar region: Secondary | ICD-10-CM | POA: Diagnosis not present

## 2014-11-04 DIAGNOSIS — M5136 Other intervertebral disc degeneration, lumbar region: Secondary | ICD-10-CM | POA: Diagnosis not present

## 2014-11-04 DIAGNOSIS — H20042 Secondary noninfectious iridocyclitis, left eye: Secondary | ICD-10-CM | POA: Diagnosis not present

## 2014-11-06 DIAGNOSIS — L84 Corns and callosities: Secondary | ICD-10-CM | POA: Diagnosis not present

## 2014-11-06 DIAGNOSIS — I739 Peripheral vascular disease, unspecified: Secondary | ICD-10-CM | POA: Diagnosis not present

## 2014-11-06 DIAGNOSIS — L603 Nail dystrophy: Secondary | ICD-10-CM | POA: Diagnosis not present

## 2014-11-28 DIAGNOSIS — I10 Essential (primary) hypertension: Secondary | ICD-10-CM | POA: Diagnosis not present

## 2014-11-28 DIAGNOSIS — Z79899 Other long term (current) drug therapy: Secondary | ICD-10-CM | POA: Diagnosis not present

## 2014-11-28 DIAGNOSIS — F329 Major depressive disorder, single episode, unspecified: Secondary | ICD-10-CM | POA: Diagnosis not present

## 2014-11-28 DIAGNOSIS — D72819 Decreased white blood cell count, unspecified: Secondary | ICD-10-CM | POA: Diagnosis not present

## 2014-12-23 DIAGNOSIS — M9903 Segmental and somatic dysfunction of lumbar region: Secondary | ICD-10-CM | POA: Diagnosis not present

## 2014-12-23 DIAGNOSIS — M47816 Spondylosis without myelopathy or radiculopathy, lumbar region: Secondary | ICD-10-CM | POA: Diagnosis not present

## 2014-12-23 DIAGNOSIS — M545 Low back pain: Secondary | ICD-10-CM | POA: Diagnosis not present

## 2014-12-23 DIAGNOSIS — M5136 Other intervertebral disc degeneration, lumbar region: Secondary | ICD-10-CM | POA: Diagnosis not present

## 2015-01-01 DIAGNOSIS — H4320 Crystalline deposits in vitreous body, unspecified eye: Secondary | ICD-10-CM | POA: Diagnosis not present

## 2015-01-01 DIAGNOSIS — H40023 Open angle with borderline findings, high risk, bilateral: Secondary | ICD-10-CM | POA: Diagnosis not present

## 2015-01-01 DIAGNOSIS — H04123 Dry eye syndrome of bilateral lacrimal glands: Secondary | ICD-10-CM | POA: Diagnosis not present

## 2015-01-10 ENCOUNTER — Emergency Department (HOSPITAL_COMMUNITY)
Admission: EM | Admit: 2015-01-10 | Discharge: 2015-01-10 | Disposition: A | Discharge status: Home / Self Care | Payer: Medicare Other | Attending: Emergency Medicine | Admitting: Emergency Medicine

## 2015-01-10 ENCOUNTER — Emergency Department (HOSPITAL_COMMUNITY): Payer: Medicare Other

## 2015-01-10 ENCOUNTER — Encounter (HOSPITAL_COMMUNITY): Payer: Self-pay | Admitting: Emergency Medicine

## 2015-01-10 DIAGNOSIS — Z791 Long term (current) use of non-steroidal anti-inflammatories (NSAID): Secondary | ICD-10-CM | POA: Diagnosis not present

## 2015-01-10 DIAGNOSIS — Z8619 Personal history of other infectious and parasitic diseases: Secondary | ICD-10-CM | POA: Diagnosis not present

## 2015-01-10 DIAGNOSIS — Z79899 Other long term (current) drug therapy: Secondary | ICD-10-CM | POA: Insufficient documentation

## 2015-01-10 DIAGNOSIS — M545 Low back pain, unspecified: Secondary | ICD-10-CM

## 2015-01-10 DIAGNOSIS — Z8679 Personal history of other diseases of the circulatory system: Secondary | ICD-10-CM | POA: Insufficient documentation

## 2015-01-10 DIAGNOSIS — Z7982 Long term (current) use of aspirin: Secondary | ICD-10-CM | POA: Insufficient documentation

## 2015-01-10 DIAGNOSIS — R52 Pain, unspecified: Secondary | ICD-10-CM | POA: Diagnosis not present

## 2015-01-10 DIAGNOSIS — I10 Essential (primary) hypertension: Secondary | ICD-10-CM | POA: Diagnosis not present

## 2015-01-10 LAB — URINALYSIS, ROUTINE W REFLEX MICROSCOPIC
BILIRUBIN URINE: NEGATIVE
Glucose, UA: NEGATIVE mg/dL
Hgb urine dipstick: NEGATIVE
KETONES UR: NEGATIVE mg/dL
Leukocytes, UA: NEGATIVE
NITRITE: NEGATIVE
PH: 7.5 (ref 5.0–8.0)
Protein, ur: NEGATIVE mg/dL
SPECIFIC GRAVITY, URINE: 1.013 (ref 1.005–1.030)
Urobilinogen, UA: 0.2 mg/dL (ref 0.0–1.0)

## 2015-01-10 MED ORDER — IBUPROFEN 800 MG PO TABS: 800.0000 mg | ORAL_TABLET | Freq: Three times a day (TID) | ORAL | Status: DC

## 2015-01-10 MED ORDER — IBUPROFEN 800 MG PO TABS
800.0000 mg | ORAL_TABLET | Freq: Once | ORAL | Status: AC
  Administered 2015-01-10: 800 mg via ORAL
  Filled 2015-01-10: qty 1

## 2015-01-10 NOTE — ED Notes (Signed)
Pt ambulatory to restroom down the hallway, gait steady. Denies pain or dizziness.

## 2015-01-10 NOTE — ED Provider Notes (Signed)
CSN: [id number]     Arrival date & time 01/10/15  1211 History   First MD Initiated Contact with Patient 01/10/15 1225     Chief Complaint  Patient presents with  . Back Pain     (Consider location/radiation/quality/duration/timing/severity/associated sxs/prior Treatment) HPI  81 year old female with a history of intermittent low back pain which is usually mild, she has been moving over the last couple of days, in the process she has been doing some lifting and some bending and today while she was standing she developed gradual onset of lower back pain which seemed to radiate up into her upper back and head, became severe, caused her to have to sit down. She immediately improved upon sitting down and had this time has only mild low back pain. She denies any other symptoms including fevers, chills, nausea, vomiting, weakness, numbness, abdominal pain, dysuria, hematuria, diarrhea, constipation, rectal bleeding. She has no history of cancer, no history of fevers or IV drug use, has had no medications prior to arrival.   She has had no food or fluids today  Past Medical History  Diagnosis Date  . Hypertension   . Chicken pox     as a child  . Mumps     as a child  . MVP (mitral valve prolapse)    Past Surgical History  Procedure Laterality Date  . Abdominal hysterectomy    . 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 Tonsillectomy and adenoidectomy    . Eye surgery    . Cataract extraction Right 11 2013   Family History  Problem Relation Age of Onset  . Diabetes Sister   . Hypertension Sister   . Hypertension Brother   . Diabetes Brother    Social History  Substance Use Topics  . Smoking status: Never Smoker   . Smokeless tobacco: Never Used  . Alcohol Use: Yes     Comment: rarley   OB History    No data available     Review of Systems  All other systems reviewed and are negative.     Allergies  Review of patient's allergies indicates no known allergies.  Home  Medications   Prior to Admission medications   Medication Sig Start Date End Date Taking? Authorizing Provider  aspirin EC 81 MG tablet Take 81 mg by mouth daily.   Yes Historical Provider, MD  donepezil (ARICEPT) 5 MG tablet Take 5 mg by mouth at bedtime.   Yes Historical Provider, MD  hydrochlorothiazide (HYDRODIURIL) 25 MG tablet TAKE 1 TABLET BY MOUTH EVERY DAY 12/03/13  Yes Marletta Lor, MD  lisinopril (PRINIVIL,ZESTRIL) 10 MG tablet Take 10 mg by mouth daily.   Yes Historical Provider, MD  potassium chloride SA (KLOR-CON M20) 20 MEQ tablet TAKE 1 TABLET DAILY 12/03/13  Yes Marletta Lor, MD  ibuprofen (ADVIL,MOTRIN) 800 MG tablet Take 1 tablet (800 mg total) by mouth 3 (three) times daily. 01/10/15   Noemi Chapel, MD   BP 145/62 mmHg  Pulse 59  Temp(Src) 97.7 F (36.5 C) (Oral)  Resp 17  SpO2 99% Physical Exam  Constitutional: She appears well-developed and well-nourished. No distress.  HENT:  Head: Normocephalic and atraumatic.  Mouth/Throat: Oropharynx is clear and moist. No oropharyngeal exudate.  Eyes: Conjunctivae and EOM are normal. Pupils are equal, round, and reactive to light. Right eye exhibits no discharge. Left eye exhibits no discharge. No scleral icterus.  Neck: Normal range of motion. Neck supple. No JVD present.  No thyromegaly present.  Cardiovascular: Normal rate, regular rhythm, normal heart sounds and intact distal pulses.  Exam reveals no gallop and no friction rub.   No murmur heard. Pulmonary/Chest: Effort normal and breath sounds normal. No respiratory distress. She has no wheezes. She has no rales.  Abdominal: Soft. Bowel sounds are normal. She exhibits no distension and no mass. There is no tenderness.  Very thin, aorta is palpable but normal-sized, no abdominal tenderness whatsoever  Musculoskeletal: Normal range of motion. She exhibits tenderness (minimal tenderness across the bilateral lower back, no significant spinal tenderness). She exhibits  no edema.  Lymphadenopathy:    She has no cervical adenopathy.  Neurological: She is alert. Coordination normal.  Able to straight leg raise bilaterally without any difficulties, normal strength at the hips knees and ankles, normal sensation bilaterally, normal reflexes at the patellar tendons bilaterally  Skin: Skin is warm and dry. No rash noted. No erythema.  Psychiatric: She has a normal mood and affect. Her behavior is normal.  Nursing note and vitals reviewed.   ED Course  Procedures (including critical care time) Labs Review Labs Reviewed  URINALYSIS, ROUTINE W REFLEX MICROSCOPIC (NOT AT [facility]) - Abnormal; Notable for the following:    APPearance CLOUDY (*)    All other components within normal limits    Imaging Review Dg Lumbar Spine Complete  01/10/2015   CLINICAL DATA:  Acute lumbar back pain.  EXAM: LUMBAR SPINE - COMPLETE 4+ VIEW  COMPARISON:  None.  FINDINGS: Five non rib-bearing lumbar type vertebra are identified in normal alignment.  There is no evidence of acute fracture or subluxation.  Mild multilevel degenerative disc disease and spondylosis noted.  Mild facet arthropathy in the lower lumbar spine present.  No focal bony lesions or spondylolysis identified.  IMPRESSION: No evidence of acute abnormality.  Mild multilevel degenerative changes.   Electronically Signed   By: Margarette Canada M.D.   On: 01/10/2015 13:28   I have personally reviewed and evaluated these images and lab results as part of my medical decision-making.   MDM   Final diagnoses:  Bilateral low back pain without sciatica     the patient is well-appearing, she has no signs of abnormalities of her abdomen, back or neurologic symptoms. Due to her severe back pain will obtain lumbar x-rays given her age. Also get a urinalysis, give her ibuprofen and give her something to eat and she has had nothing to eat or drink today.   Xray neg  UA neg  Pt stable for d/c.  Meds given in ED:  Medications   ibuprofen (ADVIL,MOTRIN) tablet 800 mg (800 mg Oral Given 01/10/15 1327)    New Prescriptions   IBUPROFEN (ADVIL,MOTRIN) 800 MG TABLET    Take 1 tablet (800 mg total) by mouth 3 (three) times daily.      Noemi Chapel, MD 01/10/15 [phone]

## 2015-01-10 NOTE — ED Notes (Signed)
Pt to ED via [facility] - pt was standing in parking lot speaking with someone, had sudden onset of sharp lumbar back pain that radiated across entire back, with this pt reports sudden dizziness and unsteady gait. Pt then went to a bench and the symptoms subsided. Pt just recently moved into a new apartment and has been packing and unpacking boxes. Denies a fall. A/o x4. BP 174/88, HR 60 regular. 12 lead unremarkable. CBG 104.

## 2015-01-10 NOTE — Discharge Instructions (Signed)

## 2015-01-19 DIAGNOSIS — I739 Peripheral vascular disease, unspecified: Secondary | ICD-10-CM | POA: Diagnosis not present

## 2015-01-19 DIAGNOSIS — L603 Nail dystrophy: Secondary | ICD-10-CM | POA: Diagnosis not present

## 2015-01-30 DIAGNOSIS — Z79899 Other long term (current) drug therapy: Secondary | ICD-10-CM | POA: Diagnosis not present

## 2015-03-24 ENCOUNTER — Encounter: Payer: Self-pay | Admitting: *Deleted

## 2015-03-27 ENCOUNTER — Ambulatory Visit (INDEPENDENT_AMBULATORY_CARE_PROVIDER_SITE_OTHER): Payer: Medicare Other | Admitting: Internal Medicine

## 2015-03-27 ENCOUNTER — Ambulatory Visit
Admission: RE | Admit: 2015-03-27 | Discharge: 2015-03-27 | Disposition: A | Discharge status: Home / Self Care | Payer: Medicare Other | Source: Ambulatory Visit | Attending: Internal Medicine | Admitting: Internal Medicine

## 2015-03-27 ENCOUNTER — Encounter: Payer: Self-pay | Admitting: Internal Medicine

## 2015-03-27 VITALS — BP 120/78 | HR 96 | Temp 97.4°F | Resp 20 | Ht 65.0 in | Wt 111.8 lb

## 2015-03-27 DIAGNOSIS — Z23 Encounter for immunization: Secondary | ICD-10-CM | POA: Diagnosis not present

## 2015-03-27 DIAGNOSIS — R634 Abnormal weight loss: Secondary | ICD-10-CM

## 2015-03-27 DIAGNOSIS — L659 Nonscarring hair loss, unspecified: Secondary | ICD-10-CM | POA: Insufficient documentation

## 2015-03-27 DIAGNOSIS — F0390 Unspecified dementia without behavioral disturbance: Secondary | ICD-10-CM

## 2015-03-27 DIAGNOSIS — D72819 Decreased white blood cell count, unspecified: Secondary | ICD-10-CM | POA: Diagnosis not present

## 2015-03-27 DIAGNOSIS — Z780 Asymptomatic menopausal state: Secondary | ICD-10-CM | POA: Diagnosis not present

## 2015-03-27 DIAGNOSIS — M5136 Other intervertebral disc degeneration, lumbar region: Secondary | ICD-10-CM | POA: Diagnosis not present

## 2015-03-27 DIAGNOSIS — E2839 Other primary ovarian failure: Secondary | ICD-10-CM

## 2015-03-27 NOTE — Progress Notes (Signed)
Patient ID: [name] [name], female   DOB: [birth year], 81 y.o.   MRN: [id number]    Location:    [facility]   Place of Service:  OFFICE    Advanced Directive information Does patient have an advance directive?: Yes  Chief Complaint  Patient presents with  . Establish Care    New Patient Establish Care, Old charts in chart  . Immunizations    will take flu shot   . OTHER    Niece in room with patient    HPI:  81 yo female seen today as a new pt. She has c/a hair loss weight loss (20 lbs in 4 mos) and worsening memory loss. Tried aricept but after a few mos it caused HA and became ineffective. No diarrhea or nightmares. 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 Last labs in October but results unavailable. Last MMSE in 06/2014 was 26 out of 30. Last colonoscopy in 2010 was nml. Last mammogram in 06/2014 was nml. No issues with eating. Sleeping well. She had a near fall in Oct 2016 when she got lightheaded while having severe back pain. She was taken to the ER by EMS. L-spine xray revealed arthritic changes  Past Medical History  Diagnosis Date  . Hypertension   . Chicken pox     as a child  . Mumps     as a child  . MVP (mitral valve prolapse)   . Arthritis     Osteoarthritis    Past Surgical History  Procedure Laterality Date  . Abdominal hysterectomy    . 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 Tonsillectomy and adenoidectomy    . Eye surgery    . Cataract extraction Right 11 2013    Patient Care Team: Gildardo Cranker, DO as PCP - General (Internal Medicine)  Social History   Social History  . Marital Status: Widowed    Spouse Name: N/A  . Number of Children: N/A  . Years of Education: N/A   Occupational History  . Not on file.   Social History Main Topics  . Smoking status: Never Smoker   . Smokeless tobacco: Never Used  . Alcohol Use: Yes     Comment: rarely  . Drug Use: No  . Sexual Activity: Not on  file   Other Topics Concern  . Not on file   Social History Narrative   Retired since 2004. Thought middle school in [place] masters degree widowed.  re located to [place]         Diet:   Do you drink/eat things with caffeine? Sometimes   Marital status: Widow                          What year were you married?   Do you live in a house, apartment, assisted living, condo, trailer, etc)? Condo   Is it one or more stories? Yes   How many persons live in your home? 1   Do you have any pets in your home? 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 Yes  Type & how often: walking daily   Do you have a living will?  No   Do you have a DNR Form? No   Do you have a POA/HPOA forms? Yes           reports that she has never smoked. She has never used smokeless tobacco. She reports that she drinks alcohol. She reports that she does not use illicit drugs.  Family History  Problem Relation Age of Onset  . Diabetes Sister   . Hypertension Sister   . Hypertension Brother   . Diabetes Brother   . Alzheimer's disease Mother   . Cancer Father    Family Status  Relation Status Death Age  . Mother Deceased 50    Died of senile dementia  . Father Deceased     Lung cancer    Immunization History  Administered Date(s) Administered  . Influenza,inj,Quad PF,36+ Mos 02/03/2014  . Pneumococcal Polysaccharide-23 12/03/2012  . Tdap 08/24/2010    No Known Allergies  Medications: Patient's Medications  New Prescriptions   No medications on file  Previous Medications   ASPIRIN EC 81 MG TABLET    Take 81 mg by mouth daily.   DONEPEZIL (ARICEPT) 5 MG TABLET    Take 5 mg by mouth at bedtime.   HYDROCHLOROTHIAZIDE (HYDRODIURIL) 25 MG TABLET    TAKE 1 TABLET BY MOUTH EVERY DAY   IBUPROFEN (ADVIL,MOTRIN) 800 MG TABLET    Take 1 tablet (800 mg total) by mouth 3 (three) times daily.    LISINOPRIL (PRINIVIL,ZESTRIL) 10 MG TABLET    Take 10 mg by mouth daily.   POTASSIUM CHLORIDE SA (KLOR-CON M20) 20 MEQ TABLET    TAKE 1 TABLET DAILY  Modified Medications   No medications on file  Discontinued Medications   No medications on file    Review of Systems  Unable to perform ROS: Dementia  Constitutional: Positive for unexpected weight change.  HENT: Positive for hearing loss, rhinorrhea and tinnitus (constant).   Eyes: Positive for visual disturbance.  Musculoskeletal: Positive for back pain.  Skin:       Hair loss  Psychiatric/Behavioral: The patient is nervous/anxious.        Memory loss   Taken from new pt packet  Filed Vitals:   03/27/15 0842  BP: 120/78  Pulse: 96  Temp: 97.4 F (36.3 C)  TempSrc: Oral  Resp: 20  Height: 5\' 5"  (1.651 m)  Weight: 111 lb 12.8 oz (50.712 kg)  SpO2: 96%   Body mass index is 18.6 kg/(m^2).  Physical Exam  Constitutional: She is oriented to person, place, and time. She appears well-developed and well-nourished. She appears cachectic.  HENT:  Mouth/Throat: Oropharynx is clear and moist. No oropharyngeal exudate.  Eyes: Pupils are equal, round, and reactive to light. No scleral icterus.  Neck: Neck supple. Carotid bruit is not present. No tracheal deviation present. No thyromegaly present.  Cardiovascular: Normal rate, regular rhythm, normal heart sounds and intact distal pulses.  Exam reveals no gallop and no friction rub.   No murmur heard. No LE edema b/l. no calf TTP.   Pulmonary/Chest: Effort normal and breath sounds normal. No stridor. No respiratory distress. She has no wheezes. She has no rales.  Abdominal: Soft. Bowel sounds are normal. She exhibits no distension and no mass. There is no hepatomegaly. There is no tenderness. There is no rebound and no guarding.  Lymphadenopathy:    She has no cervical adenopathy.  Neurological: She is alert and oriented  to person, place, and time.  Skin: Skin is warm and dry. No  rash noted.  Psychiatric: She has a normal mood and affect. Her behavior is normal. Judgment and thought content normal.     Labs reviewed: Admission on 01/10/2015, Discharged on 01/10/2015  Component Date Value Ref Range Status  . Color, Urine 01/10/2015 YELLOW  YELLOW Final  . APPearance 01/10/2015 CLOUDY* CLEAR Final  . Specific Gravity, Urine 01/10/2015 1.013  1.005 - 1.030 Final  . pH 01/10/2015 7.5  5.0 - 8.0 Final  . Glucose, UA 01/10/2015 NEGATIVE  NEGATIVE mg/dL Final  . Hgb urine dipstick 01/10/2015 NEGATIVE  NEGATIVE Final  . Bilirubin Urine 01/10/2015 NEGATIVE  NEGATIVE Final  . Ketones, ur 01/10/2015 NEGATIVE  NEGATIVE mg/dL Final  . Protein, ur 01/10/2015 NEGATIVE  NEGATIVE mg/dL Final  . Urobilinogen, UA 01/10/2015 0.2  0.0 - 1.0 mg/dL Final  . Nitrite 01/10/2015 NEGATIVE  NEGATIVE Final  . Leukocytes, UA 01/10/2015 NEGATIVE  NEGATIVE Final   MICROSCOPIC NOT DONE ON URINES WITH NEGATIVE PROTEIN, BLOOD, LEUKOCYTES, NITRITE, OR GLUCOSE <1000 mg/dL.    No results found.   Assessment/Plan   ICD-9-CM ICD-10-CM   1. Loss of weight - etiology unknown 783.21 R63.4 CBC with Differential     CMP     TSH     T4, Free     Vitamin D, 25-hydroxy     Urinalysis with Reflex Microscopic     Vitamin B12     DG Chest 2 View     HIV antibody (with reflex)  2. Dementia, without behavioral disturbance  294.20 F03.90 Vitamin B12     HIV antibody (with reflex)  3. Alopecia - etiology unknown; possibly related to female balding 704.00 L65.9 TSH     T4, Free  4. Postmenopausal V49.81 Z78.0 DG Bone Density  5. Ovarian failure 256.39 E28.39 DG Bone Density  6. Leukopenia - chronic 288.50 D72.819 HIV antibody (with reflex)    Will call with lab results  Continue current medications as ordered  Follow up in 1 month for CPE/ECG  Governor Matos S. Perlie Gold  [facility] and Adult Medicine [address] [place], [place] [address] [phone] Cell  (Monday-Friday 8 AM - 5 PM) [phone] After 5 PM and follow prompts

## 2015-03-27 NOTE — Patient Instructions (Addendum)
Will call with lab results  Continue current medications as ordered  Follow up in 1 month for CPE/ECG

## 2015-03-28 LAB — URINALYSIS, ROUTINE W REFLEX MICROSCOPIC
Bilirubin, UA: NEGATIVE
Glucose, UA: NEGATIVE
KETONES UA: NEGATIVE
Leukocytes, UA: NEGATIVE
NITRITE UA: NEGATIVE
PH UA: 6.5 (ref 5.0–7.5)
Protein, UA: NEGATIVE
RBC UA: NEGATIVE
Specific Gravity, UA: 1.008 (ref 1.005–1.030)
Urobilinogen, Ur: 0.2 mg/dL (ref 0.2–1.0)

## 2015-03-28 LAB — COMPREHENSIVE METABOLIC PANEL
ALBUMIN: 4.4 g/dL (ref 3.5–4.7)
ALT: 31 IU/L (ref 0–32)
AST: 38 IU/L (ref 0–40)
Albumin/Globulin Ratio: 1.4 (ref 1.1–2.5)
Alkaline Phosphatase: 55 IU/L (ref 39–117)
BUN/Creatinine Ratio: 18 (ref 11–26)
BUN: 20 mg/dL (ref 8–27)
Bilirubin Total: 0.5 mg/dL (ref 0.0–1.2)
CALCIUM: 9.3 mg/dL (ref 8.7–10.3)
CO2: 30 mmol/L — AB (ref 18–29)
CREATININE: 1.13 mg/dL — AB (ref 0.57–1.00)
Chloride: 101 mmol/L (ref 96–106)
GFR calc Af Amer: 53 mL/min/{1.73_m2} — ABNORMAL LOW (ref >59)
GFR, EST NON AFRICAN AMERICAN: 46 mL/min/{1.73_m2} — AB (ref >59)
GLUCOSE: 76 mg/dL (ref 65–99)
Globulin, Total: 3.1 g/dL (ref 1.5–4.5)
Potassium: 3.6 mmol/L (ref 3.5–5.2)
SODIUM: 144 mmol/L (ref 134–144)
TOTAL PROTEIN: 7.5 g/dL (ref 6.0–8.5)

## 2015-03-28 LAB — CBC WITH DIFFERENTIAL/PLATELET
BASOS ABS: 0 10*3/uL (ref 0.0–0.2)
BASOS: 1 %
EOS (ABSOLUTE): 0.1 10*3/uL (ref 0.0–0.4)
Eos: 3 %
HEMOGLOBIN: 13.1 g/dL (ref 11.1–15.9)
Hematocrit: 38.5 % (ref 34.0–46.6)
IMMATURE GRANS (ABS): 0 10*3/uL (ref 0.0–0.1)
IMMATURE GRANULOCYTES: 0 %
LYMPHS: 36 %
Lymphocytes Absolute: 0.7 10*3/uL (ref 0.7–3.1)
MCH: 29.9 pg (ref 26.6–33.0)
MCHC: 34 g/dL (ref 31.5–35.7)
MCV: 88 fL (ref 79–97)
MONOCYTES: 11 %
Monocytes Absolute: 0.2 10*3/uL (ref 0.1–0.9)
NEUTROS PCT: 49 %
Neutrophils Absolute: 1 10*3/uL — ABNORMAL LOW (ref 1.4–7.0)
PLATELETS: 187 10*3/uL (ref 150–379)
RBC: 4.38 x10E6/uL (ref 3.77–5.28)
RDW: 14 % (ref 12.3–15.4)
WBC: 2.1 10*3/uL — CL (ref 3.4–10.8)

## 2015-03-28 LAB — VITAMIN D 25 HYDROXY (VIT D DEFICIENCY, FRACTURES): VIT D 25 HYDROXY: 34.3 ng/mL (ref 30.0–100.0)

## 2015-03-28 LAB — HIV ANTIBODY (ROUTINE TESTING W REFLEX): HIV SCREEN 4TH GENERATION: NONREACTIVE

## 2015-03-28 LAB — T4, FREE: FREE T4: 1.28 ng/dL (ref 0.82–1.77)

## 2015-03-28 LAB — VITAMIN B12: Vitamin B-12: 676 pg/mL (ref 211–946)

## 2015-03-28 LAB — TSH: TSH: 2.61 u[IU]/mL (ref 0.450–4.500)

## 2015-03-29 DIAGNOSIS — D72819 Decreased white blood cell count, unspecified: Secondary | ICD-10-CM | POA: Insufficient documentation

## 2015-03-29 DIAGNOSIS — M5136 Other intervertebral disc degeneration, lumbar region: Secondary | ICD-10-CM | POA: Insufficient documentation

## 2015-03-31 ENCOUNTER — Other Ambulatory Visit: Payer: Self-pay | Admitting: *Deleted

## 2015-03-31 DIAGNOSIS — J439 Emphysema, unspecified: Secondary | ICD-10-CM

## 2015-03-31 DIAGNOSIS — J441 Chronic obstructive pulmonary disease with (acute) exacerbation: Secondary | ICD-10-CM

## 2015-03-31 DIAGNOSIS — R0602 Shortness of breath: Secondary | ICD-10-CM

## 2015-03-31 NOTE — Addendum Note (Signed)
Addended byMarisa Cyphers C on: 03/31/2015 07:41 AM   Modules accepted: Orders

## 2015-04-07 ENCOUNTER — Other Ambulatory Visit: Payer: Self-pay

## 2015-04-07 ENCOUNTER — Other Ambulatory Visit: Payer: Self-pay | Admitting: *Deleted

## 2015-04-07 ENCOUNTER — Ambulatory Visit: Payer: Medicare Other

## 2015-04-07 DIAGNOSIS — D72819 Decreased white blood cell count, unspecified: Secondary | ICD-10-CM

## 2015-04-08 ENCOUNTER — Telehealth: Payer: Self-pay | Admitting: Hematology and Oncology

## 2015-04-08 ENCOUNTER — Ambulatory Visit
Admission: RE | Admit: 2015-04-08 | Discharge: 2015-04-08 | Disposition: A | Discharge status: Home / Self Care | Payer: Medicare Other | Source: Ambulatory Visit | Attending: Internal Medicine | Admitting: Internal Medicine

## 2015-04-08 DIAGNOSIS — R0602 Shortness of breath: Secondary | ICD-10-CM

## 2015-04-08 DIAGNOSIS — J479 Bronchiectasis, uncomplicated: Secondary | ICD-10-CM | POA: Diagnosis not present

## 2015-04-08 NOTE — Telephone Encounter (Signed)
I received the consult on this patient for leukopenia. I offered to see her next week but patient's caregiver requested her to be seen on 2/2 instead

## 2015-04-14 ENCOUNTER — Other Ambulatory Visit: Payer: Medicare Other

## 2015-04-14 ENCOUNTER — Other Ambulatory Visit: Payer: Self-pay | Admitting: *Deleted

## 2015-04-14 MED ORDER — LISINOPRIL 10 MG PO TABS: 10.0000 mg | ORAL_TABLET | Freq: Every day | ORAL | Status: DC

## 2015-04-14 NOTE — Telephone Encounter (Signed)
Patient caregiver, Clint Guy called and requested. Faxed to pharmacy.

## 2015-04-23 DIAGNOSIS — L603 Nail dystrophy: Secondary | ICD-10-CM | POA: Diagnosis not present

## 2015-04-23 DIAGNOSIS — L84 Corns and callosities: Secondary | ICD-10-CM | POA: Diagnosis not present

## 2015-04-23 DIAGNOSIS — I739 Peripheral vascular disease, unspecified: Secondary | ICD-10-CM | POA: Diagnosis not present

## 2015-05-01 ENCOUNTER — Encounter: Payer: Self-pay | Admitting: Internal Medicine

## 2015-05-13 ENCOUNTER — Ambulatory Visit (INDEPENDENT_AMBULATORY_CARE_PROVIDER_SITE_OTHER): Payer: Medicare Other | Admitting: Internal Medicine

## 2015-05-13 ENCOUNTER — Encounter: Payer: Self-pay | Admitting: Internal Medicine

## 2015-05-13 VITALS — BP 110/70 | HR 74 | Temp 97.6°F | Resp 20 | Ht 65.0 in | Wt 113.8 lb

## 2015-05-13 DIAGNOSIS — M5136 Other intervertebral disc degeneration, lumbar region: Secondary | ICD-10-CM

## 2015-05-13 DIAGNOSIS — R634 Abnormal weight loss: Secondary | ICD-10-CM | POA: Diagnosis not present

## 2015-05-13 DIAGNOSIS — F0390 Unspecified dementia without behavioral disturbance: Secondary | ICD-10-CM

## 2015-05-13 DIAGNOSIS — Z0000 Encounter for general adult medical examination without abnormal findings: Secondary | ICD-10-CM | POA: Diagnosis not present

## 2015-05-13 DIAGNOSIS — D72819 Decreased white blood cell count, unspecified: Secondary | ICD-10-CM

## 2015-05-13 DIAGNOSIS — H6122 Impacted cerumen, left ear: Secondary | ICD-10-CM | POA: Diagnosis not present

## 2015-05-13 MED ORDER — MEMANTINE HCL ER 7 & 14 & 21 &28 MG PO CP24: ORAL_CAPSULE | ORAL | Status: DC

## 2015-05-13 NOTE — Patient Instructions (Addendum)
Encouraged her to exercise 30-45 minutes 4-5 times per week. Eat a well balanced diet. Avoid smoking. Limit alcohol intake. Wear seatbelt when riding in the car. Wear sun block (SPF >50) when spending extended times outside.   Start namenda XR as directed daily  Continue other medications as ordered  Follow up in 4 weeks for medication management

## 2015-05-13 NOTE — Progress Notes (Signed)
Patient ID: [name] [name], female   DOB: 1932-04-11, 82 y.o.   MRN: [id number] Subjective:    [name] [name] is a 82 y.o. female who presents for Medicare Initial preventive examination. She reports feeling well overall. No concerns. Maintains healthy lifestyle and chooses healthy foods. She exercises 3x per week at the gym - aerobics class/clasthenics  Dementia - new score 23/30 (previously 26/30)  Leukopenia - scheduled to see hematology soon  She noticed a "humming" in ears. It is constant  Preventive Screening-Counseling & Management  Tobacco History  Smoking status  . Never Smoker   Smokeless tobacco  . Never Used     Problems Prior to Visit 1. memory  Current Problems (verified) Patient Active Problem List   Diagnosis Date Noted  . DDD (degenerative disc disease), lumbar 03/29/2015  . Leukopenia 03/29/2015  . Dementia 03/27/2015  . Alopecia 03/27/2015  . Nail biting 07/16/2013  . Finger infection 07/16/2013  . Loss of weight 12/03/2012  . Hypertension 07/12/2010  . Mitral valve prolapse 07/12/2010    Medications Prior to Visit Current Outpatient Prescriptions on File Prior to Visit  Medication Sig Dispense Refill  . aspirin EC 81 MG tablet Take 81 mg by mouth daily.    . hydrochlorothiazide (HYDRODIURIL) 25 MG tablet TAKE 1 TABLET BY MOUTH EVERY DAY 90 tablet 3  . lisinopril (PRINIVIL,ZESTRIL) 10 MG tablet Take 1 tablet (10 mg total) by mouth daily. 30 tablet 5  . potassium chloride SA (KLOR-CON M20) 20 MEQ tablet TAKE 1 TABLET DAILY 90 tablet 3  . ibuprofen (ADVIL,MOTRIN) 800 MG tablet Take 1 tablet (800 mg total) by mouth 3 (three) times daily. (Patient not taking: Reported on 05/13/2015) 21 tablet 0   No current facility-administered medications on file prior to visit.    Current Medications (verified) Current Outpatient Prescriptions  Medication Sig Dispense Refill  . aspirin EC 81 MG tablet Take 81 mg by mouth daily.    . hydrochlorothiazide (HYDRODIURIL) 25  MG tablet TAKE 1 TABLET BY MOUTH EVERY DAY 90 tablet 3  . lisinopril (PRINIVIL,ZESTRIL) 10 MG tablet Take 1 tablet (10 mg total) by mouth daily. 30 tablet 5  . potassium chloride SA (KLOR-CON M20) 20 MEQ tablet TAKE 1 TABLET DAILY 90 tablet 3  . ibuprofen (ADVIL,MOTRIN) 800 MG tablet Take 1 tablet (800 mg total) by mouth 3 (three) times daily. (Patient not taking: Reported on 05/13/2015) 21 tablet 0   No current facility-administered medications for this visit.     Allergies (verified) Review of patient's allergies indicates no known allergies.   PAST HISTORY  Family History Family History  Problem Relation Age of Onset  . Diabetes Sister   . Hypertension Sister   . Hypertension Brother   . Diabetes Brother   . Alzheimer's disease Mother   . Cancer Father     Social History Social History  Substance Use Topics  . Smoking status: Never Smoker   . Smokeless tobacco: Never Used  . Alcohol Use: Yes     Comment: rarely     Are there smokers in your home (other than you)? NA  Risk Factors Current exercise habits: Gym/ health club routine includes cardio and calisthetics.  Dietary issues discussed:  Healthy meals  Cardiac risk factors: advanced age (older than 36 for men, 52 for women).  Depression Screen  Score 0 PHQ2  Activities of Daily Living In your present state of health, do you have any difficulty performing the following activities?:  Driving? No Managing money? no  Feeding yourself? No Getting from bed to chair? No  Climbing a flight of stairs? No Preparing food and eating?:no Bathing or showering? no Getting dressed: no Getting to the toilet? no Using the toilet no Moving around from place to place: no  In the past year have you fallen or had a near fall?: yes - near fall   Are you sexually active?  No  Do you have more than one partner?  NA  Hearing Difficulties:  Do you often ask people to speak up or repeat themselves? yes Do you experience ringing  or noises in your ears? yes Do you have difficulty understanding soft or whispered voices? yes   Do you feel that you have a problem with memory? Yes  Do you often misplace items? No  Do you feel safe at home?  Yes  Cognitive Testing    Advanced Directives have been discussed with the patient? Yes  List the Names of Other Physician/Practitioners you currently use: 1.    Indicate any recent Medical Services you may have received from other than [facility] providers in the past year (date may be approximate).  Immunization History  Administered Date(s) Administered  . Influenza,inj,Quad PF,36+ Mos 02/03/2014, 03/27/2015  . Pneumococcal Polysaccharide-23 12/03/2012  . Tdap 08/24/2010    Screening Tests Health Maintenance  Topic Date Due  . ZOSTAVAX  09/25/1993  . DEXA SCAN  09/26/1998  . PNA vac Low Risk Adult (2 of 2 - PCV13) 12/03/2013  . MAMMOGRAM  06/28/2015  . INFLUENZA VACCINE  11/10/2015  . TETANUS/TDAP  08/23/2020    All answers were reviewed with the patient and necessary referrals were made:  Gildardo Cranker, DO   05/13/2015   History reviewed: allergies, current medications, past family history, past medical history, past social history, past surgical history and problem list  Review of Systems   Review of Systems  Unable to perform ROS: dementia      Objective:     Vision by Snellen chart: right eye:20/40, left eye:20/40  Body mass index is 18.94 kg/(m^2). BP 110/70 mmHg  Pulse 74  Temp(Src) 97.6 F (36.4 C) (Oral)  Resp 20  Ht 5\' 5"  (1.651 m)  Wt 113 lb 12.8 oz (51.619 kg)  BMI 18.94 kg/m2  SpO2 98%    Physical Exam  Constitutional:  Frail appearing in NAD.  HENT:  Head: Normocephalic and atraumatic.  Right Ear: External ear normal.  Left Ear: External ear normal.  Mouth/Throat: Oropharynx is clear and moist. No oropharyngeal exudate.  Left cerumen impaction  Eyes: Conjunctivae and EOM are normal. Pupils are equal, round, and reactive to light.  No scleral icterus.  Neck: Normal range of motion. Neck supple. Carotid bruit is not present. No tracheal deviation present. No thyromegaly present.  Cardiovascular: Normal rate, regular rhythm and normal heart sounds.  Exam reveals no gallop and no friction rub.   No murmur heard. Pulmonary/Chest: Effort normal and breath sounds normal. She has no wheezes. She has no rhonchi. She has no rales. She exhibits no tenderness. Right breast exhibits no inverted nipple, no mass, no nipple discharge, no skin change and no tenderness. Left breast exhibits no inverted nipple, no mass, no nipple discharge, no skin change and no tenderness. Breasts are symmetrical.  Abdominal: Soft. Bowel sounds are normal. She exhibits no distension and no mass. There is no hepatosplenomegaly. There is no tenderness. There is no rebound and no guarding.  Genitourinary:  Deferred due to age  Musculoskeletal: She exhibits edema.  Lymphadenopathy:    She has no cervical adenopathy.  Neurological: She is alert. She has normal reflexes. Gait normal.  Skin: Skin is warm and dry. No rash noted.  Psychiatric: Mood and affect normal.   MMSE - Mini Mental State Exam 05/13/2015  Not completed: (No Data)  Orientation to time 4  Orientation to Place 3  Registration 3  Attention/ Calculation 5  Recall 0  Language- name 2 objects 2  Language- repeat 1  Language- follow 3 step command 3  Language- read & follow direction 1  Write a sentence 1  Copy design 0  Total score 23   Recent Results (from the past 2160 hour(s))  CBC with Differential     Status: Abnormal   Collection Time: 03/27/15 10:59 AM  Result Value Ref Range   WBC 2.1 (LL) 3.4 - 10.8 x10E3/uL   RBC 4.38 3.77 - 5.28 x10E6/uL   Hemoglobin 13.1 11.1 - 15.9 g/dL   Hematocrit 38.5 34.0 - 46.6 %   MCV 88 79 - 97 fL   MCH 29.9 26.6 - 33.0 pg   MCHC 34.0 31.5 - 35.7 g/dL   RDW 14.0 12.3 - 15.4 %   Platelets 187 150 - 379 x10E3/uL   Neutrophils 49 %   Lymphs 36 %    Monocytes 11 %   Eos 3 %   Basos 1 %   Neutrophils Absolute 1.0 (L) 1.4 - 7.0 x10E3/uL   Lymphocytes Absolute 0.7 0.7 - 3.1 x10E3/uL   Monocytes Absolute 0.2 0.1 - 0.9 x10E3/uL   EOS (ABSOLUTE) 0.1 0.0 - 0.4 x10E3/uL   Basophils Absolute 0.0 0.0 - 0.2 x10E3/uL   Immature Granulocytes 0 %   Immature Grans (Abs) 0.0 0.0 - 0.1 x10E3/uL  CMP     Status: Abnormal   Collection Time: 03/27/15 10:59 AM  Result Value Ref Range   Glucose 76 65 - 99 mg/dL   BUN 20 8 - 27 mg/dL   Creatinine, Ser 1.13 (H) 0.57 - 1.00 mg/dL   GFR calc non Af Amer 46 (L) >59 mL/min/1.73   GFR calc Af Amer 53 (L) >59 mL/min/1.73   BUN/Creatinine Ratio 18 11 - 26   Sodium 144 134 - 144 mmol/L    Comment:               **Please note reference interval change**   Potassium 3.6 3.5 - 5.2 mmol/L   Chloride 101 96 - 106 mmol/L    Comment:               **Please note reference interval change**   CO2 30 (H) 18 - 29 mmol/L   Calcium 9.3 8.7 - 10.3 mg/dL   Total Protein 7.5 6.0 - 8.5 g/dL   Albumin 4.4 3.5 - 4.7 g/dL   Globulin, Total 3.1 1.5 - 4.5 g/dL   Albumin/Globulin Ratio 1.4 1.1 - 2.5   Bilirubin Total 0.5 0.0 - 1.2 mg/dL   Alkaline Phosphatase 55 39 - 117 IU/L   AST 38 0 - 40 IU/L   ALT 31 0 - 32 IU/L  TSH     Status: None   Collection Time: 03/27/15 10:59 AM  Result Value Ref Range   TSH 2.610 0.450 - 4.500 uIU/mL  T4, Free     Status: None   Collection Time: 03/27/15 10:59 AM  Result Value Ref Range   Free T4 1.28 0.82 - 1.77 ng/dL  Vitamin D, 25-hydroxy     Status: None   Collection  Time: 03/27/15 10:59 AM  Result Value Ref Range   Vit D, 25-Hydroxy 34.3 30.0 - 100.0 ng/mL    Comment: Vitamin D deficiency has been defined by the [place] practice guideline as a level of serum 25-OH vitamin D less than 20 ng/mL (1,2). The Endocrine Society went on to further define vitamin D insufficiency as a level between 21 and 29 ng/mL (2). 1. IOM (Institute of  Medicine). 2010. Dietary reference    intakes for calcium and D. [place]: The    [employer]. 2. Holick MF, Binkley Dubberly, Bischoff-Ferrari HA, et al.    Evaluation, treatment, and prevention of vitamin D    deficiency: an Endocrine Society clinical practice    guideline. JCEM. 2011 Jul; 96(7):1911-30.   Vitamin B12     Status: None   Collection Time: 03/27/15 10:59 AM  Result Value Ref Range   Vitamin B-12 676 211 - 946 pg/mL  HIV antibody (with reflex)     Status: None   Collection Time: 03/27/15 10:59 AM  Result Value Ref Range   HIV Screen 4th Generation wRfx Non Reactive Non Reactive  Urinalysis with Reflex Microscopic     Status: None   Collection Time: 03/27/15 11:06 AM  Result Value Ref Range   Specific Gravity, UA 1.008 1.005 - 1.030   pH, UA 6.5 5.0 - 7.5   Color, UA Yellow Yellow   Appearance Ur Clear Clear   Leukocytes, UA Negative Negative   Protein, UA Negative Negative/Trace   Glucose, UA Negative Negative   Ketones, UA Negative Negative   RBC, UA Negative Negative   Bilirubin, UA Negative Negative   Urobilinogen, Ur 0.2 0.2 - 1.0 mg/dL   Nitrite, UA Negative Negative   Microscopic Examination Comment     Comment: Microscopic not indicated and not performed.     ECG obtained and reviewed by myself: NSR @ 60 bpm, nml axis, poor r wave progression. No acute ischemic changes. No there ECG available to compare  Assessment:       ICD-9-CM ICD-10-CM   1. Well adult exam V70.0 Z00.00   2. Dementia, without behavioral disturbance 294.20 F03.90 Memantine HCl ER (NAMENDA XR TITRATION PACK) 7 & 14 & 21 &28 MG CP24  3. Leukopenia 288.50 D72.819   4. DDD (degenerative disc disease), lumbar 722.52 M51.36   5. Loss of weight 783.21 R63.4   6. Cerumen impaction, left 380.4 H61.22         Plan:     During the course of the visit the patient was educated and counseled about appropriate screening and preventive services including:    Screening  electrocardiogram  Diet review for nutrition referral? Yes ____  Not Indicated __x__   Patient Instructions (the written plan) was given to the patient.  Medicare Attestation I have personally reviewed: The patient's medical and social history Their use of alcohol, tobacco or illicit drugs Their current medications and supplements The patient's functional ability including ADLs,fall risks, home safety risks, cognitive, and hearing and visual impairment Diet and physical activities Evidence for depression or mood disorders  The patient's weight, height, BMI, and visual acuity have been recorded in the chart.  I have made referrals, counseling, and provided education to the patient based on review of the above and I have provided the patient with a written personalized care plan for preventive services.     Gildardo Cranker, DO   05/13/2015  Pt is UTD on health maintenance. Vaccinations are UTD. Pt maintains a healthy lifestyle. Encouraged pt to exercise 30-45 minutes 4-5 times per week. Eat a well balanced diet. Avoid smoking. Limit alcohol intake. Wear seatbelt when riding in the car. Wear sun block (SPF >50) when spending extended times outside.  Start namenda XR as directed daily - Rx e-rx  Left ear lavage performed  F/u with hematology as scheduled for leukopenia  Continue other medications as ordered  Follow up in 4 weeks for medication management  Lyrique Hakim S. Perlie Gold  [facility] and Adult Medicine [address] [place], [place] [address] [phone] Cell (Monday-Friday 8 AM - 5 PM) [phone] After 5 PM and follow prompts

## 2015-05-14 ENCOUNTER — Ambulatory Visit (HOSPITAL_BASED_OUTPATIENT_CLINIC_OR_DEPARTMENT_OTHER): Payer: Medicare Other | Admitting: Hematology and Oncology

## 2015-05-14 ENCOUNTER — Encounter: Payer: Self-pay | Admitting: Hematology and Oncology

## 2015-05-14 ENCOUNTER — Ambulatory Visit
Admission: RE | Admit: 2015-05-14 | Discharge: 2015-05-14 | Disposition: A | Discharge status: Home / Self Care | Payer: Medicare Other | Source: Ambulatory Visit | Attending: Internal Medicine | Admitting: Internal Medicine

## 2015-05-14 VITALS — BP 147/81 | HR 65 | Temp 97.4°F | Resp 17 | Ht 65.0 in | Wt 113.0 lb

## 2015-05-14 DIAGNOSIS — L659 Nonscarring hair loss, unspecified: Secondary | ICD-10-CM | POA: Diagnosis not present

## 2015-05-14 DIAGNOSIS — R634 Abnormal weight loss: Secondary | ICD-10-CM | POA: Diagnosis not present

## 2015-05-14 DIAGNOSIS — Z780 Asymptomatic menopausal state: Secondary | ICD-10-CM | POA: Diagnosis not present

## 2015-05-14 DIAGNOSIS — D72819 Decreased white blood cell count, unspecified: Secondary | ICD-10-CM

## 2015-05-14 DIAGNOSIS — E2839 Other primary ovarian failure: Secondary | ICD-10-CM

## 2015-05-15 NOTE — Progress Notes (Signed)
[place] NOTE  Patient Care Team: Gildardo Cranker, DO as PCP - General (Internal Medicine)  CHIEF COMPLAINTS/PURPOSE OF CONSULTATION:  Chronic leukopenia, recent weight loss  HISTORY OF PRESENTING ILLNESS:  [name] [name] 82 y.o. female is here because of chronic leukopenia. I reviewed her records extensively. She have chronic leukopenia since 2012 with a white count ranging from 2.1-3. She denies recurrent infection. She had recent cold-like illness but denies any need for antibiotics for over a year. She denies atypical infection such as meningitis or shingles. The patient has history of pituitary tumor resected but never required any form of adjuvant treatment. She denies atypical skin rash, joint pain or diagnosis of autoimmune condition. She is not on any medications that could cause immunosuppression. The patient had gradual weight loss over the past year. She exercises regularly and has been eating a very healthy lifestyle. She eats low-fat diet  She complained of recent hair loss Recent vitamin B-12 level and thyroid function test were within normal limits  MEDICAL HISTORY:  Past Medical History  Diagnosis Date  . Hypertension   . Chicken pox     as a child  . Mumps     as a child  . MVP (mitral valve prolapse)   . Arthritis     Osteoarthritis    SURGICAL HISTORY: Past Surgical History  Procedure Laterality Date  . Abdominal hysterectomy    . 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 Tonsillectomy and adenoidectomy    . Eye surgery    . Cataract extraction Right 11 2013    SOCIAL HISTORY: Social History   Social History  . Marital Status: Widowed    Spouse Name: N/A  . Number of Children: N/A  . Years of Education: N/A   Occupational History  . Not on file.   Social History Main Topics  . Smoking status: Never Smoker   . Smokeless tobacco: Never Used  . Alcohol Use: Yes     Comment: rarely  . Drug Use: No  . Sexual Activity:  Not on file   Other Topics Concern  . Not on file   Social History Narrative   Retired since 2004. [employer] middle school in [place] masters degree widowed.  re located to [place]         Diet:   Do you drink/eat things with caffeine? Sometimes   Marital status: Widow                          What year were you married?   Do you live in a house, apartment, assisted living, condo, trailer, etc)? Condo   Is it one or more stories? Yes   How many persons live in your home? 1   Do you have any pets in your home? 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 Yes                                                    Type & how often: walking daily   Do you have a living will?  No   Do you have a DNR Form? No   Do you have a POA/HPOA forms? Yes  FAMILY HISTORY: Family History  Problem Relation Age of Onset  . Diabetes Sister   . Hypertension Sister   . Hypertension Brother   . Diabetes Brother   . Alzheimer's disease Mother   . Cancer Father     ALLERGIES:  has No Known Allergies.  MEDICATIONS:  Current Outpatient Prescriptions  Medication Sig Dispense Refill  . aspirin EC 81 MG tablet Take 81 mg by mouth daily.    . hydrochlorothiazide (HYDRODIURIL) 25 MG tablet TAKE 1 TABLET BY MOUTH EVERY DAY 90 tablet 3  . ibuprofen (ADVIL,MOTRIN) 800 MG tablet Take 1 tablet (800 mg total) by mouth 3 (three) times daily. (Patient not taking: Reported on 05/13/2015) 21 tablet 0  . lisinopril (PRINIVIL,ZESTRIL) 10 MG tablet Take 1 tablet (10 mg total) by mouth daily. 30 tablet 5  . Memantine HCl ER (NAMENDA XR TITRATION PACK) 7 & 14 & 21 &28 MG CP24 Take as directed 30 capsule 0  . potassium chloride SA (KLOR-CON M20) 20 MEQ tablet TAKE 1 TABLET DAILY 90 tablet 3   No current facility-administered medications for this visit.    REVIEW OF SYSTEMS:   Constitutional: Denies fevers, chills or abnormal night sweats Eyes:  Denies blurriness of vision, double vision or watery eyes Ears, nose, mouth, throat, and face: Denies mucositis or sore throat Respiratory: Denies cough, dyspnea or wheezes Cardiovascular: Denies palpitation, chest discomfort or lower extremity swelling Gastrointestinal:  Denies nausea, heartburn or change in bowel habits Skin: Denies abnormal skin rashes Lymphatics: Denies new lymphadenopathy or easy bruising Neurological:Denies numbness, tingling or new weaknesses Behavioral/Psych: Mood is stable, no new changes  All other systems were reviewed with the patient and are negative.  PHYSICAL EXAMINATION: ECOG PERFORMANCE STATUS: 0 - Asymptomatic  Filed Vitals:   05/14/15 1041  BP: 147/81  Pulse: 65  Temp: 97.4 F (36.3 C)  Resp: 17   Filed Weights   05/14/15 1041  Weight: 113 lb (51.256 kg)    GENERAL:alert, no distress and comfortable SKIN: skin color, texture, turgor are normal, no rashes or significant lesions EYES: normal, conjunctiva are pink and non-injected, sclera clear OROPHARYNX:no exudate, no erythema and lips, buccal mucosa, and tongue normal  NECK: supple, thyroid normal size, non-tender, without nodularity LYMPH:  no palpable lymphadenopathy in the cervical, axillary or inguinal LUNGS: clear to auscultation and percussion with normal breathing effort HEART: regular rate & rhythm and no murmurs and no lower extremity edema ABDOMEN:abdomen soft, non-tender and normal bowel sounds Musculoskeletal:no cyanosis of digits and no clubbing  PSYCH: alert & oriented x 3 with fluent speech NEURO: no focal motor/sensory deficits  LABORATORY DATA:  I have reviewed the data as listed Lab Results  Component Value Date   WBC 2.1* 03/27/2015   HGB 12.3 12/03/2013   HCT 38.5 03/27/2015   MCV 88 03/27/2015   PLT 187 03/27/2015    Recent Labs  03/27/15 1059  NA 144  K 3.6  CL 101  CO2 30*  GLUCOSE 76  BUN 20  CREATININE 1.13*  CALCIUM 9.3  GFRNONAA 46*  GFRAA  53*  PROT 7.5  ALBUMIN 4.4  AST 38  ALT 31  ALKPHOS 55  BILITOT 0.5    RADIOGRAPHIC STUDIES: I have personally reviewed the radiological images as listed and agreed with the findings in the report. Dg Bone Density  05/14/2015  EXAM: DUAL X-RAY ABSORPTIOMETRY (DXA) FOR BONE MINERAL DENSITY IMPRESSION: Referring Physician:  MONICA CARTER PATIENT: Name: Jakailyn, Dils Patient ID: [id number] Birth Date: 1931-12-31 Height: 67.0  in. Sex: Female Measured: 05/14/2015 Weight: 113.0 lbs. Indications: Advanced Age, Bilateral Ovariectomy (65.51), Estrogen Deficient, Hysterectomy, Low Body Weight (783.22), Low Calcium Intake (269.3), Postmenopausal Fractures: Treatments: ASSESSMENT: The BMD measured at Femur Total Right is 0.909 g/cm2 with a T-score of -0.8. This patient is considered normal according to [facility]) criteria. Site Region Measured Date Measured Age YA BMD Significant CHANGE T-score DualFemur Total Right 05/14/2015    81.6         -0.10    0.909 g/cm2 AP Spine  L1-L4       05/14/2015    83.6         -0.1    1.182 g/cm2 World Health Organization [facility]) criteria for post-menopausal, Caucasian Women: Normal       T-score at or above -1 SD Osteopenia   T-score between -1 and -2.5 SD Osteoporosis T-score at or below -2.5 SD RECOMMENDATION: [place] recommends that FDA-approved medical therapies be considered in postmenopausal women and men age 42 or older with a: 1. Hip or vertebral (clinical or morphometric) fracture. 2. T-score of <-2.5 at the spine or hip. 3. Ten-year fracture probability by FRAX of 3% or greater for hip fracture or 20% or greater for major osteoporotic fracture. All treatment decisions require clinical judgment and consideration of individual patient factors, including patient preferences, co-morbidities, previous drug use, risk factors not captured in the FRAX model (e.g. falls, vitamin D deficiency, increased bone turnover, interval significant  decline in bone density) and possible under - or over-estimation of fracture risk by FRAX. All patients should ensure an adequate intake of dietary calcium (1200 mg/d) and vitamin D (800 IU daily) unless contraindicated. FOLLOW-UP: People with diagnosed cases of osteoporosis or at high risk for fracture should have regular bone mineral density tests. For patients eligible for Medicare, routine testing is allowed once every 2 years. The testing frequency can be increased to one year for patients who have rapidly progressing disease, those who are receiving or discontinuing medical therapy to restore bone mass, or have additional risk factors. I have reviewed this report, and agree with the above findings. [facility] Radiology Electronically Signed   By: David  Martinique M.D.   On: 05/14/2015 13:43    ASSESSMENT & PLAN:  Leukopenia All her blood tests suggest that she have chronic leukopenia, likely congenital leukopenia associated with African-American heritage. She is not symptomatic with recurrent infections. No further workup is needed.  Loss of weight She had recent weight loss. Upon further evaluation, it appears that the patient has been exercising and eating healthy. She has extensive workup done by her primary care provider. I recommend liberal diet to include fatty meals and high calorie intake. I will defer to her primary care doctor for further management. Her recent weight loss is not associated with her leukopenia  Alopecia Her recent alopecia could be related to malnutrition from recent weight loss. Recommend multivitamin supplement     All questions were answered. The patient knows to call the clinic with any problems, questions or concerns. I spent 30 minutes counseling the patient face to face. The total time spent in the appointment was 40 minutes and more than 50% was on counseling.     [place], [place], MD 05/15/2015 8:35 AM

## 2015-05-15 NOTE — Assessment & Plan Note (Signed)
Her recent alopecia could be related to malnutrition from recent weight loss. Recommend multivitamin supplement

## 2015-05-15 NOTE — Assessment & Plan Note (Signed)
She had recent weight loss. Upon further evaluation, it appears that the patient has been exercising and eating healthy. She has extensive workup done by her primary care provider. I recommend liberal diet to include fatty meals and high calorie intake. I will defer to her primary care doctor for further management. Her recent weight loss is not associated with her leukopenia

## 2015-05-15 NOTE — Assessment & Plan Note (Signed)
All her blood tests suggest that she have chronic leukopenia, likely congenital leukopenia associated with African-American heritage. She is not symptomatic with recurrent infections. No further workup is needed.

## 2015-06-12 ENCOUNTER — Encounter: Payer: Self-pay | Admitting: Internal Medicine

## 2015-06-12 ENCOUNTER — Ambulatory Visit (INDEPENDENT_AMBULATORY_CARE_PROVIDER_SITE_OTHER): Payer: Medicare Other | Admitting: Internal Medicine

## 2015-06-12 VITALS — BP 148/88 | HR 69 | Temp 97.8°F | Resp 20 | Ht 65.0 in | Wt 113.0 lb

## 2015-06-12 DIAGNOSIS — F0390 Unspecified dementia without behavioral disturbance: Secondary | ICD-10-CM

## 2015-06-12 MED ORDER — MEMANTINE HCL ER 7 MG PO CP24: 7.0000 mg | ORAL_CAPSULE | Freq: Every day | ORAL | Status: DC

## 2015-06-12 NOTE — Progress Notes (Signed)
Patient ID: [name] [name], female   DOB: 11-Mar-1932, 82 y.o.   MRN: [id number]    Location:    [facility]   Place of Service:  OFFICE   Chief Complaint  Patient presents with  . Medical Management of Chronic Issues    4 week follow-up  . OTHER    Niece in room with patient  . OTHER    Patient having problems with the namenda    HPI:  82 yo female seen today for f/u dementia. She was started on namenda XR at her last OV. She is a poor historian due to dementia. Hx obtained from niece. Pt reports she had a reaction to the Namenda XR. She had 2 doses of namenda 14mg  left and awakened with "squeezing" pounding HA and she stopped taking the med. She also noticed strands of hair turned white. No rash.   Dementia - new score 23/30 (previously 26/30)  Past Medical History  Diagnosis Date  . Hypertension   . Chicken pox     as a child  . Mumps     as a child  . MVP (mitral valve prolapse)   . Arthritis     Osteoarthritis    Past Surgical History  Procedure Laterality Date  . Abdominal hysterectomy    . 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 Tonsillectomy and adenoidectomy    . Eye surgery    . Cataract extraction Right 11 2013    Patient Care Team: Gildardo Cranker, DO as PCP - General (Internal Medicine)  Social History   Social History  . Marital Status: Widowed    Spouse Name: N/A  . Number of Children: N/A  . Years of Education: N/A   Occupational History  . Not on file.   Social History Main Topics  . Smoking status: Never Smoker   . Smokeless tobacco: Never Used  . Alcohol Use: Yes     Comment: rarely  . Drug Use: No  . Sexual Activity: Not on file   Other Topics Concern  . Not on file   Social History Narrative   Retired since 2004. [employer] middle school in [place] masters degree widowed.  re located to [place]         Diet:   Do you drink/eat things with caffeine? Sometimes   Marital status: Widow                          What year  were you married?   Do you live in a house, apartment, assisted living, condo, trailer, etc)? Condo   Is it one or more stories? Yes   How many persons live in your home? 1   Do you have any pets in your home? 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 Yes                                                    Type & how often: walking daily   Do you have a living will?  No   Do you have a DNR Form? No   Do you have a POA/HPOA forms? Yes           reports that  she has never smoked. She has never used smokeless tobacco. She reports that she drinks alcohol. She reports that she does not use illicit drugs.  No Known Allergies  Medications: Patient's Medications  New Prescriptions   No medications on file  Previous Medications   ASPIRIN EC 81 MG TABLET    Take 81 mg by mouth daily.   HYDROCHLOROTHIAZIDE (HYDRODIURIL) 25 MG TABLET    TAKE 1 TABLET BY MOUTH EVERY DAY   IBUPROFEN (ADVIL,MOTRIN) 800 MG TABLET    Take 1 tablet (800 mg total) by mouth 3 (three) times daily.   LISINOPRIL (PRINIVIL,ZESTRIL) 10 MG TABLET    Take 1 tablet (10 mg total) by mouth daily.   MEMANTINE HCL ER (NAMENDA XR TITRATION PACK) 7 & 14 & 21 &28 MG CP24    Take as directed   POTASSIUM CHLORIDE SA (KLOR-CON M20) 20 MEQ TABLET    TAKE 1 TABLET DAILY  Modified Medications   No medications on file  Discontinued Medications   No medications on file    Review of Systems  Unable to perform ROS: Dementia    Filed Vitals:   06/12/15 1422  BP: 148/88  Pulse: 69  Temp: 97.8 F (36.6 C)  TempSrc: Oral  Resp: 20  Height: 5\' 5"  (1.651 m)  Weight: 113 lb (51.256 kg)  SpO2: 98%   Body mass index is 18.8 kg/(m^2).  Physical Exam  Constitutional: She appears well-developed.  Frail appearing in NAD  Cardiovascular: Normal rate, regular rhythm, normal heart sounds and intact distal pulses.  Exam reveals no gallop and no friction rub.   No murmur  heard. Pulmonary/Chest: Effort normal and breath sounds normal. No respiratory distress. She has no wheezes.  Musculoskeletal: She exhibits edema. She exhibits no tenderness.  Neurological: She is alert.  Skin: Skin is warm and dry. No rash noted.  Psychiatric: She has a normal mood and affect. Her behavior is normal.     Labs reviewed: Office Visit on 03/27/2015  Component Date Value Ref Range Status  . WBC 03/27/2015 2.1* 3.4 - 10.8 x10E3/uL Final  . RBC 03/27/2015 4.38  3.77 - 5.28 x10E6/uL Final  . Hemoglobin 03/27/2015 13.1  11.1 - 15.9 g/dL Final  . Hematocrit 03/27/2015 38.5  34.0 - 46.6 % Final  . MCV 03/27/2015 88  79 - 97 fL Final  . MCH 03/27/2015 29.9  26.6 - 33.0 pg Final  . MCHC 03/27/2015 34.0  31.5 - 35.7 g/dL Final  . RDW 03/27/2015 14.0  12.3 - 15.4 % Final  . Platelets 03/27/2015 187  150 - 379 x10E3/uL Final  . Neutrophils 03/27/2015 49   Final  . Lymphs 03/27/2015 36   Final  . Monocytes 03/27/2015 11   Final  . Eos 03/27/2015 3   Final  . Basos 03/27/2015 1   Final  . Neutrophils Absolute 03/27/2015 1.0* 1.4 - 7.0 x10E3/uL Final  . Lymphocytes Absolute 03/27/2015 0.7  0.7 - 3.1 x10E3/uL Final  . Monocytes Absolute 03/27/2015 0.2  0.1 - 0.9 x10E3/uL Final  . EOS (ABSOLUTE) 03/27/2015 0.1  0.0 - 0.4 x10E3/uL Final  . Basophils Absolute 03/27/2015 0.0  0.0 - 0.2 x10E3/uL Final  . Immature Granulocytes 03/27/2015 0   Final  . Immature Grans (Abs) 03/27/2015 0.0  0.0 - 0.1 x10E3/uL Final  . Glucose 03/27/2015 76  65 - 99 mg/dL Final  . BUN 03/27/2015 20  8 - 27 mg/dL Final  . Creatinine, Ser 03/27/2015 1.13* 0.57 - 1.00 mg/dL Final  .  GFR calc non Af Amer 03/27/2015 46* >59 mL/min/1.73 Final  . GFR calc Af Amer 03/27/2015 53* >59 mL/min/1.73 Final  . BUN/Creatinine Ratio 03/27/2015 18  11 - 26 Final  . Sodium 03/27/2015 144  134 - 144 mmol/L Final                 **Please note reference interval change**  . Potassium 03/27/2015 3.6  3.5 - 5.2 mmol/L Final  .  Chloride 03/27/2015 101  96 - 106 mmol/L Final                 **Please note reference interval change**  . CO2 03/27/2015 30* 18 - 29 mmol/L Final  . Calcium 03/27/2015 9.3  8.7 - 10.3 mg/dL Final  . Total Protein 03/27/2015 7.5  6.0 - 8.5 g/dL Final  . Albumin 03/27/2015 4.4  3.5 - 4.7 g/dL Final  . Globulin, Total 03/27/2015 3.1  1.5 - 4.5 g/dL Final  . Albumin/Globulin Ratio 03/27/2015 1.4  1.1 - 2.5 Final  . Bilirubin Total 03/27/2015 0.5  0.0 - 1.2 mg/dL Final  . Alkaline Phosphatase 03/27/2015 55  39 - 117 IU/L Final  . AST 03/27/2015 38  0 - 40 IU/L Final  . ALT 03/27/2015 31  0 - 32 IU/L Final  . TSH 03/27/2015 2.610  0.450 - 4.500 uIU/mL Final  . Free T4 03/27/2015 1.28  0.82 - 1.77 ng/dL Final  . Vit D, 25-Hydroxy 03/27/2015 34.3  30.0 - 100.0 ng/mL Final   Comment: Vitamin D deficiency has been defined by the [place] practice guideline as a level of serum 25-OH vitamin D less than 20 ng/mL (1,2). The Endocrine Society went on to further define vitamin D insufficiency as a level between 21 and 29 ng/mL (2). 1. IOM (Institute of Medicine). 2010. Dietary reference    intakes for calcium and D. [place]: The    [employer]. 2. Holick MF, Binkley Dunreith, Bischoff-Ferrari HA, et al.    Evaluation, treatment, and prevention of vitamin D    deficiency: an Endocrine Society clinical practice    guideline. JCEM. 2011 Jul; 96(7):1911-30.   [place] Kitchen Specific Gravity, UA 03/27/2015 1.008  1.005 - 1.030 Final  . pH, UA 03/27/2015 6.5  5.0 - 7.5 Final  . Color, UA 03/27/2015 Yellow  Yellow Final  . Appearance Ur 03/27/2015 Clear  Clear Final  . Leukocytes, UA 03/27/2015 Negative  Negative Final  . Protein, UA 03/27/2015 Negative  Negative/Trace Final  . Glucose, UA 03/27/2015 Negative  Negative Final  . Ketones, UA 03/27/2015 Negative  Negative Final  . RBC, UA 03/27/2015 Negative  Negative Final  . Bilirubin, UA 03/27/2015 Negative   Negative Final  . Urobilinogen, Ur 03/27/2015 0.2  0.2 - 1.0 mg/dL Final  . Nitrite, UA 03/27/2015 Negative  Negative Final  . Microscopic Examination 03/27/2015 Comment   Final   Microscopic not indicated and not performed.  . Vitamin B-12 03/27/2015 676  211 - 946 pg/mL Final  . HIV Screen 4th Generation wRfx 03/27/2015 Non Reactive  Non Reactive Final    Dg Bone Density  05/14/2015  EXAM: DUAL X-RAY ABSORPTIOMETRY (DXA) FOR BONE MINERAL DENSITY IMPRESSION: Referring Physician:  Chlora Mcbain PATIENT: Name: Pailey, Willenbrink Patient ID: [id number] Birth Date: [birth year] Height: 67.0 in. Sex: Female Measured: 05/14/2015 Weight: 113.0 lbs. Indications: Advanced Age, Bilateral Ovariectomy (65.51), Estrogen Deficient, Hysterectomy, Low Body Weight (783.22), Low Calcium Intake (269.3), Postmenopausal Fractures: Treatments: ASSESSMENT: The BMD measured  at Femur Total Right is 0.909 g/cm2 with a T-score of -0.8. This patient is considered normal according to [facility]) criteria. Site Region Measured Date Measured Age YA BMD Significant CHANGE T-score DualFemur Total Right 05/14/2015    81.6         -0.8    0.909 g/cm2 AP Spine  L1-L4       05/14/2015    81.6         -0.1    1.182 g/cm2 World Health Organization [facility]) criteria for post-menopausal, Caucasian Women: Normal       T-score at or above -1 SD Osteopenia   T-score between -1 and -2.5 SD Osteoporosis T-score at or below -2.5 SD RECOMMENDATION: [place] recommends that FDA-approved medical therapies be considered in postmenopausal women and men age 66 or older with a: 1. Hip or vertebral (clinical or morphometric) fracture. 2. T-score of <-2.5 at the spine or hip. 3. Ten-year fracture probability by FRAX of 3% or greater for hip fracture or 20% or greater for major osteoporotic fracture. All treatment decisions require clinical judgment and consideration of individual patient factors, including patient preferences,  co-morbidities, previous drug use, risk factors not captured in the FRAX model (e.g. falls, vitamin D deficiency, increased bone turnover, interval significant decline in bone density) and possible under - or over-estimation of fracture risk by FRAX. All patients should ensure an adequate intake of dietary calcium (1200 mg/d) and vitamin D (800 IU daily) unless contraindicated. FOLLOW-UP: People with diagnosed cases of osteoporosis or at high risk for fracture should have regular bone mineral density tests. For patients eligible for Medicare, routine testing is allowed once every 2 years. The testing frequency can be increased to one year for patients who have rapidly progressing disease, those who are receiving or discontinuing medical therapy to restore bone mass, or have additional risk factors. I have reviewed this report, and agree with the above findings. [facility] Radiology Electronically Signed   By: David  Martinique M.D.   On: 05/14/2015 13:43     Assessment/Plan   ICD-9-CM ICD-10-CM   1. Dementia, without behavioral disturbance 294.20 F03.90 memantine (NAMENDA XR) 7 MG CP24 24 hr capsule    Reduce namenda xr to 7mg  due to ADR at higher dose  Cont other meds as ordered  Follow up in 2 mos for dementia  Karalee Hauter S. Perlie Gold  [facility] and Adult Medicine [address] [place], [place] [address] [phone] Cell (Monday-Friday 8 AM - 5 PM) [phone] After 5 PM and follow prompts

## 2015-06-12 NOTE — Patient Instructions (Signed)
Change namenda xr to 7mg  daily - call if not able to tolerate  Continue other medications as ordered  Follow up in 2 mos for dementia

## 2015-06-13 DIAGNOSIS — H2513 Age-related nuclear cataract, bilateral: Secondary | ICD-10-CM | POA: Diagnosis not present

## 2015-06-22 DIAGNOSIS — S0501XA Injury of conjunctiva and corneal abrasion without foreign body, right eye, initial encounter: Secondary | ICD-10-CM | POA: Diagnosis not present

## 2015-06-22 DIAGNOSIS — H1130 Conjunctival hemorrhage, unspecified eye: Secondary | ICD-10-CM | POA: Diagnosis not present

## 2015-06-25 DIAGNOSIS — H1131 Conjunctival hemorrhage, right eye: Secondary | ICD-10-CM | POA: Diagnosis not present

## 2015-07-02 DIAGNOSIS — H40013 Open angle with borderline findings, low risk, bilateral: Secondary | ICD-10-CM | POA: Diagnosis not present

## 2015-07-06 DIAGNOSIS — L84 Corns and callosities: Secondary | ICD-10-CM | POA: Diagnosis not present

## 2015-07-06 DIAGNOSIS — I739 Peripheral vascular disease, unspecified: Secondary | ICD-10-CM | POA: Diagnosis not present

## 2015-07-06 DIAGNOSIS — L603 Nail dystrophy: Secondary | ICD-10-CM | POA: Diagnosis not present

## 2015-07-23 DIAGNOSIS — M71572 Other bursitis, not elsewhere classified, left ankle and foot: Secondary | ICD-10-CM | POA: Diagnosis not present

## 2015-07-23 DIAGNOSIS — M2042 Other hammer toe(s) (acquired), left foot: Secondary | ICD-10-CM | POA: Diagnosis not present

## 2015-09-30 DIAGNOSIS — I739 Peripheral vascular disease, unspecified: Secondary | ICD-10-CM | POA: Diagnosis not present

## 2015-09-30 DIAGNOSIS — L603 Nail dystrophy: Secondary | ICD-10-CM | POA: Diagnosis not present

## 2015-10-07 ENCOUNTER — Ambulatory Visit (INDEPENDENT_AMBULATORY_CARE_PROVIDER_SITE_OTHER): Payer: Medicare Other | Admitting: Internal Medicine

## 2015-10-07 ENCOUNTER — Encounter: Payer: Self-pay | Admitting: Internal Medicine

## 2015-10-07 VITALS — BP 142/68 | HR 68 | Temp 97.6°F | Ht 65.0 in | Wt 109.0 lb

## 2015-10-07 DIAGNOSIS — I10 Essential (primary) hypertension: Secondary | ICD-10-CM | POA: Diagnosis not present

## 2015-10-07 DIAGNOSIS — R634 Abnormal weight loss: Secondary | ICD-10-CM

## 2015-10-07 DIAGNOSIS — M5136 Other intervertebral disc degeneration, lumbar region: Secondary | ICD-10-CM | POA: Diagnosis not present

## 2015-10-07 DIAGNOSIS — F0390 Unspecified dementia without behavioral disturbance: Secondary | ICD-10-CM | POA: Diagnosis not present

## 2015-10-07 NOTE — Progress Notes (Signed)
Patient ID: [name] [name], female   DOB: February 01, 1932, 82 y.o.   MRN: [id number]    Location:  [facility] Place of Service: OFFICE  Chief Complaint  Patient presents with  . Medical Management of Chronic Issues    3 month follow up, here with neice and neice husband    HPI:  82 yo female seen today for f/u. She reports feeling well overall. She had to stop namenda due to HA. Memory is unchanged and somewhat better. She has lost 4 lbs since last OV. Appetite slightly reduced. increased exercise with warmer weather. She walks and goes to the gym regularly. She plays the piano frequently. Hematology determined weight loss not related to leukopenia  Dementia - MMSE score 23/30 (previously 26/30). She was started on namenda XR at a previous OV. She is a poor historian due to dementia. Hx obtained from niece. She reported she had a reaction to the Namenda XR. She had 2 doses of namenda 14mg  left and awakened with "squeezing" pounding HA and she stopped taking the med. She also noticed strands of hair turned white. No rash.   Leukopenia - she saw hematology and determined that she has chronic leukopenia, likely congential associated with African-American heritage. No further w/u. She is asymptomatic  Chronic LBP - last L spine xray in 2016 showed multilevel DDD. She has pain with prolonged walking that resolves when she sits down. She takes ibuprofen prn. No numbness/tingling in LE. No falls  HTN - BP stable on lisinopril and HCTZ  Past Medical History  Diagnosis Date  . Hypertension   . Chicken pox     as a child  . Mumps     as a child  . MVP (mitral valve prolapse)   . Arthritis     Osteoarthritis    Past Surgical History  Procedure Laterality Date  . Abdominal hysterectomy    . 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 Tonsillectomy and adenoidectomy    . Eye surgery    . Cataract extraction Right 11 2013    Patient Care Team: Gildardo Cranker, DO as PCP - General (Internal  Medicine)  Social History   Social History  . Marital Status: Widowed    Spouse Name: N/A  . Number of Children: N/A  . Years of Education: N/A   Occupational History  . Not on file.   Social History Main Topics  . Smoking status: Never Smoker   . Smokeless tobacco: Never Used  . Alcohol Use: Yes     Comment: rarely  . Drug Use: No  . Sexual Activity: Not on file   Other Topics Concern  . Not on file   Social History Narrative   Retired since 2004. [employer] in [place] masters degree widowed.  re located to [place]         Diet:   Do you drink/eat things with caffeine? Sometimes   Marital status: Widow                          What year were you married?   Do you live in a house, apartment, assisted living, condo, trailer, etc)? Condo   Is it one or more stories? Yes   How many persons live in your home? 1   Do you have any pets in your home? 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 Yes                                                    Type & how often: walking daily   Do you have a living will?  No   Do you have a DNR Form? No   Do you have a POA/HPOA forms? Yes           reports that she has never smoked. She has never used smokeless tobacco. She reports that she drinks alcohol. She reports that she does not use illicit drugs.  Family History  Problem Relation Age of Onset  . Diabetes Sister   . Hypertension Sister   . Hypertension Brother   . Diabetes Brother   . Alzheimer's disease Mother   . Cancer Father    Family Status  Relation Status Death Age  . Mother Deceased 64    Died of senile dementia  . Father Deceased     Lung cancer     No Known Allergies  Medications: Patient's Medications  New Prescriptions   No medications on file  Previous Medications   ASPIRIN EC 81 MG TABLET    Take 81 mg by mouth daily.   HYDROCHLOROTHIAZIDE (HYDRODIURIL) 25 MG TABLET     TAKE 1 TABLET BY MOUTH EVERY DAY   IBUPROFEN (ADVIL,MOTRIN) 800 MG TABLET    Take 800 mg by mouth as needed.   LISINOPRIL (PRINIVIL,ZESTRIL) 10 MG TABLET    Take 1 tablet (10 mg total) by mouth daily.   POTASSIUM CHLORIDE SA (KLOR-CON M20) 20 MEQ TABLET    TAKE 1 TABLET DAILY  Modified Medications   No medications on file  Discontinued Medications   IBUPROFEN (ADVIL,MOTRIN) 800 MG TABLET    Take 1 tablet (800 mg total) by mouth 3 (three) times daily.   MEMANTINE (NAMENDA XR) 7 MG CP24 24 HR CAPSULE    Take 1 capsule (7 mg total) by mouth daily.    Review of Systems  Unable to perform ROS: Dementia    Filed Vitals:   10/07/15 1343  BP: 142/68  Pulse: 68  Temp: 97.6 F (36.4 C)  TempSrc: Oral  Height: 5\' 5"  (1.651 m)  Weight: 109 lb (49.442 kg)  SpO2: 99%   Body mass index is 18.14 kg/(m^2).  Physical Exam  Constitutional: She appears well-developed.  Frail appearing in NAD  HENT:  Mouth/Throat: Oropharynx is clear and moist. No oropharyngeal exudate.  Eyes: Pupils are equal, round, and reactive to light. No scleral icterus.  Neck: Neck supple. Carotid bruit is not present. No tracheal deviation present.  Cardiovascular: Normal rate, regular rhythm, normal heart sounds and intact distal pulses.  Exam reveals no gallop and no friction rub.   No murmur heard. No LE edema b/l. no calf TTP.   Pulmonary/Chest: Effort normal and breath sounds normal. No stridor. No respiratory distress. She has no wheezes. She has no rales.  Abdominal: Soft. Bowel sounds are normal. She exhibits no distension and no mass. There is no hepatomegaly. There is no tenderness. There is no rebound and no guarding.  Musculoskeletal: She exhibits edema (R>L ankle).  Lymphadenopathy:    She has no cervical adenopathy.  Neurological: She is alert.  Skin: Skin is warm and dry. No rash noted.  Psychiatric: She has a normal mood and affect.  Her behavior is normal.     Labs reviewed: No visits with results  within 3 Month(s) from this visit. Latest known visit with results is:  Office Visit on 03/27/2015  Component Date Value Ref Range Status  . WBC 03/27/2015 2.1* 3.4 - 10.8 x10E3/uL Final  . RBC 03/27/2015 4.38  3.77 - 5.28 x10E6/uL Final  . Hemoglobin 03/27/2015 13.1  11.1 - 15.9 g/dL Final  . Hematocrit 03/27/2015 38.5  34.0 - 46.6 % Final  . MCV 03/27/2015 88  79 - 97 fL Final  . MCH 03/27/2015 29.9  26.6 - 33.0 pg Final  . MCHC 03/27/2015 34.0  31.5 - 35.7 g/dL Final  . RDW 03/27/2015 14.0  12.3 - 15.4 % Final  . Platelets 03/27/2015 187  150 - 379 x10E3/uL Final  . Neutrophils 03/27/2015 49   Final  . Lymphs 03/27/2015 36   Final  . Monocytes 03/27/2015 11   Final  . Eos 03/27/2015 3   Final  . Basos 03/27/2015 1   Final  . Neutrophils Absolute 03/27/2015 1.0* 1.4 - 7.0 x10E3/uL Final  . Lymphocytes Absolute 03/27/2015 0.7  0.7 - 3.1 x10E3/uL Final  . Monocytes Absolute 03/27/2015 0.2  0.1 - 0.9 x10E3/uL Final  . EOS (ABSOLUTE) 03/27/2015 0.1  0.0 - 0.4 x10E3/uL Final  . Basophils Absolute 03/27/2015 0.0  0.0 - 0.2 x10E3/uL Final  . Immature Granulocytes 03/27/2015 0   Final  . Immature Grans (Abs) 03/27/2015 0.0  0.0 - 0.1 x10E3/uL Final  . Glucose 03/27/2015 76  65 - 99 mg/dL Final  . BUN 03/27/2015 20  8 - 27 mg/dL Final  . Creatinine, Ser 03/27/2015 1.13* 0.57 - 1.00 mg/dL Final  . GFR calc non Af Amer 03/27/2015 46* >59 mL/min/1.73 Final  . GFR calc Af Amer 03/27/2015 53* >59 mL/min/1.73 Final  . BUN/Creatinine Ratio 03/27/2015 18  11 - 26 Final  . Sodium 03/27/2015 144  134 - 144 mmol/L Final                 **Please note reference interval change**  . Potassium 03/27/2015 3.6  3.5 - 5.2 mmol/L Final  . Chloride 03/27/2015 101  96 - 106 mmol/L Final                 **Please note reference interval change**  . CO2 03/27/2015 30* 18 - 29 mmol/L Final  . Calcium 03/27/2015 9.3  8.7 - 10.3 mg/dL Final  . Total Protein 03/27/2015 7.5  6.0 - 8.5 g/dL Final  . Albumin  03/27/2015 4.4  3.5 - 4.7 g/dL Final  . Globulin, Total 03/27/2015 3.1  1.5 - 4.5 g/dL Final  . Albumin/Globulin Ratio 03/27/2015 1.4  1.1 - 2.5 Final  . Bilirubin Total 03/27/2015 0.5  0.0 - 1.2 mg/dL Final  . Alkaline Phosphatase 03/27/2015 55  39 - 117 IU/L Final  . AST 03/27/2015 38  0 - 40 IU/L Final  . ALT 03/27/2015 31  0 - 32 IU/L Final  . TSH 03/27/2015 2.610  0.450 - 4.500 uIU/mL Final  . Free T4 03/27/2015 1.28  0.82 - 1.77 ng/dL Final  . Vit D, 25-Hydroxy 03/27/2015 34.3  30.0 - 100.0 ng/mL Final   Comment: Vitamin D deficiency has been defined by the [place] practice guideline as a level of serum 25-OH vitamin D less than 20 ng/mL (1,2). The Endocrine Society went on to further define vitamin D insufficiency as a level between 21 and  29 ng/mL (2). 1. IOM (Institute of Medicine). 2010. Dietary reference    intakes for calcium and D. [place]: The    [employer]. 2. Holick MF, Binkley Castalia, Bischoff-Ferrari HA, et al.    Evaluation, treatment, and prevention of vitamin D    deficiency: an Endocrine Society clinical practice    guideline. JCEM. 2011 Jul; 96(7):1911-30.   Marland Kitchen Specific Gravity, UA 03/27/2015 1.008  1.005 - 1.030 Final  . pH, UA 03/27/2015 6.5  5.0 - 7.5 Final  . Color, UA 03/27/2015 Yellow  Yellow Final  . Appearance Ur 03/27/2015 Clear  Clear Final  . Leukocytes, UA 03/27/2015 Negative  Negative Final  . Protein, UA 03/27/2015 Negative  Negative/Trace Final  . Glucose, UA 03/27/2015 Negative  Negative Final  . Ketones, UA 03/27/2015 Negative  Negative Final  . RBC, UA 03/27/2015 Negative  Negative Final  . Bilirubin, UA 03/27/2015 Negative  Negative Final  . Urobilinogen, Ur 03/27/2015 0.2  0.2 - 1.0 mg/dL Final  . Nitrite, UA 03/27/2015 Negative  Negative Final  . Microscopic Examination 03/27/2015 Comment   Final   Microscopic not indicated and not performed.  . Vitamin B-12 03/27/2015 676  211 - 946  pg/mL Final  . HIV Screen 4th Generation wRfx 03/27/2015 Non Reactive  Non Reactive Final    No results found.   Assessment/Plan   ICD-9-CM ICD-10-CM   1. Loss of weight - possibly related to #2 but r/o metabolic cause [id number] Basic Metabolic Panel  2. Dementia, without behavioral disturbance  294.20 F03.90   3. Essential hypertension - stable 401.9 99991111 Basic Metabolic Panel  4. DDD (degenerative disc disease), lumbar - stable 722.52 M51.36     hold off on medication for memory for now. Will revisit if dementia worsens  Check BMP today  Recommend boost or ensure at least daily   Continue other medications as ordered  Will call with lab results  Follow up in 3 mos for routine visit   Kashon Kraynak S. Perlie Gold  [facility] [address] [place], [place] [address] [phone] Cell (Monday-Friday 8 AM - 5 PM) [phone] After 5 PM and follow prompts

## 2015-10-07 NOTE — Patient Instructions (Addendum)
Recommend boost or ensure at least daily   Continue other medications as ordered  Will call with lab results  Follow up in 3 mos for routine visit

## 2015-10-08 LAB — BASIC METABOLIC PANEL
BUN / CREAT RATIO: 24 (ref 12–28)
BUN: 29 mg/dL — ABNORMAL HIGH (ref 8–27)
CO2: 29 mmol/L (ref 18–29)
CREATININE: 1.23 mg/dL — AB (ref 0.57–1.00)
Calcium: 9.2 mg/dL (ref 8.7–10.3)
Chloride: 102 mmol/L (ref 96–106)
GFR, EST AFRICAN AMERICAN: 47 mL/min/{1.73_m2} — AB (ref >59)
GFR, EST NON AFRICAN AMERICAN: 41 mL/min/{1.73_m2} — AB (ref >59)
Glucose: 72 mg/dL (ref 65–99)
POTASSIUM: 4.3 mmol/L (ref 3.5–5.2)
SODIUM: 144 mmol/L (ref 134–144)

## 2015-10-12 ENCOUNTER — Other Ambulatory Visit: Payer: Self-pay | Admitting: Internal Medicine

## 2015-11-30 ENCOUNTER — Other Ambulatory Visit: Payer: Self-pay | Admitting: *Deleted

## 2015-11-30 MED ORDER — HYDROCHLOROTHIAZIDE 25 MG PO TABS
ORAL_TABLET | ORAL | 1 refills | Status: DC
Start: 2015-11-30 — End: 2016-06-05

## 2015-11-30 NOTE — Telephone Encounter (Signed)
[facility] [address] [address] 

## 2015-12-17 ENCOUNTER — Other Ambulatory Visit: Payer: Self-pay

## 2015-12-17 MED ORDER — POTASSIUM CHLORIDE CRYS ER 20 MEQ PO TBCR: EXTENDED_RELEASE_TABLET | ORAL | 3 refills | Status: DC

## 2015-12-29 DIAGNOSIS — S0511XA Contusion of eyeball and orbital tissues, right eye, initial encounter: Secondary | ICD-10-CM | POA: Diagnosis not present

## 2015-12-29 DIAGNOSIS — T1590XA Foreign body on external eye, part unspecified, unspecified eye, initial encounter: Secondary | ICD-10-CM | POA: Diagnosis not present

## 2016-01-20 ENCOUNTER — Ambulatory Visit: Payer: Medicare Other | Admitting: Internal Medicine

## 2016-02-12 ENCOUNTER — Ambulatory Visit: Payer: Medicare Other | Admitting: Internal Medicine

## 2016-02-12 DIAGNOSIS — I739 Peripheral vascular disease, unspecified: Secondary | ICD-10-CM | POA: Diagnosis not present

## 2016-02-12 DIAGNOSIS — L603 Nail dystrophy: Secondary | ICD-10-CM | POA: Diagnosis not present

## 2016-03-30 ENCOUNTER — Encounter: Payer: Self-pay | Admitting: Internal Medicine

## 2016-03-30 ENCOUNTER — Ambulatory Visit (INDEPENDENT_AMBULATORY_CARE_PROVIDER_SITE_OTHER): Payer: Medicare Other | Admitting: Internal Medicine

## 2016-03-30 VITALS — BP 124/72 | HR 93 | Temp 98.1°F | Ht 65.0 in | Wt 118.0 lb

## 2016-03-30 DIAGNOSIS — Z23 Encounter for immunization: Secondary | ICD-10-CM

## 2016-03-30 DIAGNOSIS — I10 Essential (primary) hypertension: Secondary | ICD-10-CM | POA: Diagnosis not present

## 2016-03-30 DIAGNOSIS — M5136 Other intervertebral disc degeneration, lumbar region: Secondary | ICD-10-CM

## 2016-03-30 DIAGNOSIS — K1370 Unspecified lesions of oral mucosa: Secondary | ICD-10-CM

## 2016-03-30 DIAGNOSIS — K1379 Other lesions of oral mucosa: Secondary | ICD-10-CM

## 2016-03-30 NOTE — Progress Notes (Signed)
Patient ID: [name] [name], female   DOB: Sep 24, 1931, 82 y.o.   MRN: [id number]    Location:  [facility] Place of Service: OFFICE  Chief Complaint  Patient presents with  . Medical Management of Chronic Issues    3 month follow-up on Dementia, here with neice [name]   . Mass    Pea-sized nodule under chin, noticed when at the dentist yesterday     HPI:  82 yo female seen today for f/u. She has a NT nodule under chin x unknown duration. No d/c or redness. It was noticed by dental hygienist yesterday. She has been eating and has gained 9 lbs since last visit. She continues to the piano as often as she can  Dementia - MMSE score 23/30 (previously 26/30). She was started on namenda XR at a previous OV. She is a poor historian due to dementia. Hx obtained from niece. She reported she had a reaction to the Namenda XR. She had 2 doses of namenda 80m left and awakened with "squeezing" pounding HA and she stopped taking the med. She also noticed strands of hair turned white. No rash.   Leukopenia - she saw hematology and determined that she has chronic leukopenia, likely congential associated with African-American heritage. No further w/u. She is asymptomatic  Chronic LBP - last L spine xray in 2016 showed multilevel DDD. She has pain with prolonged walking that resolves when she sits down. She takes ibuprofen prn. No numbness/tingling in LE. No falls  HTN - BP stable on lisinopril and HCTZ with K+ supplement  Past Medical History:  Diagnosis Date  . Arthritis    Osteoarthritis  . Chicken pox    as a child  . Hypertension   . Mumps    as a child  . MVP (mitral valve prolapse)     Past Surgical History:  Procedure Laterality Date  . ABDOMINAL HYSTERECTOMY    . CATARACT EXTRACTION Right 11 2013  . EYE SURGERY    . 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 TONSILLECTOMY AND ADENOIDECTOMY      Patient Care Team: MGildardo Cranker DO as PCP - General (Internal Medicine)  Social History    Social History  . Marital status: Widowed    Spouse name: N/A  . Number of children: N/A  . Years of education: N/A   Occupational History  . Not on file.   Social History Main Topics  . Smoking status: Never Smoker  . Smokeless tobacco: Never Used  . Alcohol use Yes     Comment: rarely  . Drug use: No  . Sexual activity: Not on file   Other Topics Concern  . Not on file   Social History Narrative   Retired since 2004. [employer] [place].  [place]        Diet:   Do you drink/eat things with caffeine? Sometimes   Marital status: Widow                          What year were you married?   Do you live in a house, apartment, assisted living, condo, trailer, etc)? Condo   Is it one or more stories? Yes   How many persons live in your home? 1   Do you have any pets in your home? 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 Yes                                                    Type & how often: walking daily   Do you have a living will?  No   Do you have a DNR Form? No   Do you have a POA/HPOA forms? Yes           reports that she has never smoked. She has never used smokeless tobacco. She reports that she drinks alcohol. She reports that she does not use drugs.  Family History  Problem Relation Age of Onset  . Alzheimer's disease Mother   . Cancer Father   . Diabetes Sister   . Hypertension Sister   . Hypertension Brother   . Diabetes Brother    Family Status  Relation Status  . Mother Deceased at age 85   Died of senile dementia  . Father Deceased   Lung cancer  . Sister   . Brother      No Known Allergies  Medications: Patient's Medications  New Prescriptions   No medications on file  Previous Medications   ASPIRIN EC 81 MG TABLET    Take 81 mg by mouth daily.   HYDROCHLOROTHIAZIDE (HYDRODIURIL) 25 MG TABLET    Take one tablet by  mouth once daily   IBUPROFEN (ADVIL,MOTRIN) 800 MG TABLET    Take 800 mg by mouth as needed.   LISINOPRIL (PRINIVIL,ZESTRIL) 10 MG TABLET    TAKE ONE TABLET BY MOUTH ONCE DAILY   POTASSIUM CHLORIDE SA (KLOR-CON M20) 20 MEQ TABLET    TAKE 1 TABLET DAILY  Modified Medications   No medications on file  Discontinued Medications   No medications on file    Review of Systems  Vitals:   03/30/16 1348  BP: 124/72  Pulse: 93  Temp: 98.1 F (36.7 C)  TempSrc: Oral  SpO2: 96%  Weight: 118 lb (53.5 kg)  Height: 5' 5"  (1.651 m)   Body mass index is 19.64 kg/m.  Physical Exam  Constitutional: She appears well-developed.  Frail appearing in NAD  HENT:  Mouth/Throat: Oropharynx is clear and moist. No oropharyngeal exudate.  Eyes: Pupils are equal, round, and reactive to light. No scleral icterus.  Neck: Neck supple. Carotid bruit is not present. No tracheal deviation present.    Cardiovascular: Normal rate, regular rhythm, normal heart sounds and intact distal pulses.  Exam reveals no gallop and no friction rub.   No murmur heard. No LE edema b/l. no calf TTP.   Pulmonary/Chest: Effort normal and breath sounds normal. No stridor. No respiratory distress. She has no wheezes. She has no rales.  Abdominal: Soft. Bowel sounds are normal. She exhibits no distension and no mass. There is no hepatomegaly. There is no tenderness. There is no rebound and no guarding.  Musculoskeletal: She exhibits edema (R>L ankle).  Lymphadenopathy:    She has no cervical adenopathy.  Neurological: She is alert.  Skin: Skin is warm and dry. No rash noted.  Psychiatric: She has a normal mood and affect. Her behavior is normal.     Labs reviewed: No visits with results within 3 Month(s) from this visit.  Latest known visit with results is:  Office Visit on 10/07/2015  Component Date Value Ref Range Status  .  Glucose 10/08/2015 72  65 - 99 mg/dL Final  . BUN 10/08/2015 29* 8 - 27 mg/dL Final  .  Creatinine, Ser 10/08/2015 1.23* 0.57 - 1.00 mg/dL Final  . GFR calc non Af Amer 10/08/2015 41* >59 mL/min/1.73 Final  . GFR calc Af Amer 10/08/2015 47* >59 mL/min/1.73 Final  . BUN/Creatinine Ratio 10/08/2015 24  12 - 28 Final  . Sodium 10/08/2015 144  134 - 144 mmol/L Final  . Potassium 10/08/2015 4.3  3.5 - 5.2 mmol/L Final  . Chloride 10/08/2015 102  96 - 106 mmol/L Final  . CO2 10/08/2015 29  18 - 29 mmol/L Final  . Calcium 10/08/2015 9.2  8.7 - 10.3 mg/dL Final    No results found.   Assessment/Plan   ICD-9-CM ICD-10-CM   1. Lump in mouth 784.2 K13.70 DG Neck Soft Tissue  2. Essential hypertension 401.9 I10 BMP with eGFR  3. DDD (degenerative disc disease), lumbar 722.52 M51.36   4. Encounter for immunization Z23 Z23 Flu Vaccine QUAD 36+ mos IM    Continue current medications  Will call with lab results  Follow up for xray soft tissue neck  Follow up in 3 mos for CPE/AWV. Fasting labs on day of appt  Cinderella Christoffersen S. Perlie Gold  [facility] [address] [place], [place] [address] [phone] Cell (Monday-Friday 8 AM - 5 PM) [phone] After 5 PM and follow prompts

## 2016-03-30 NOTE — Patient Instructions (Signed)
Continue current medications  Will call with lab results  Follow up for xray soft tissue neck  Follow up in 3 mos for CPE/AWV. Fasting labs on day of appt

## 2016-03-31 LAB — BASIC METABOLIC PANEL WITH GFR
BUN: 33 mg/dL — ABNORMAL HIGH (ref 7–25)
CHLORIDE: 104 mmol/L (ref 98–110)
CO2: 32 mmol/L — AB (ref 20–31)
CREATININE: 1.12 mg/dL — AB (ref 0.60–0.88)
Calcium: 9.3 mg/dL (ref 8.6–10.4)
GFR, EST NON AFRICAN AMERICAN: 46 mL/min — AB (ref >=60)
GFR, Est African American: 53 mL/min — ABNORMAL LOW (ref >=60)
Glucose, Bld: 61 mg/dL — ABNORMAL LOW (ref 65–99)
Potassium: 4.2 mmol/L (ref 3.5–5.3)
SODIUM: 144 mmol/L (ref 135–146)

## 2016-04-29 DIAGNOSIS — I739 Peripheral vascular disease, unspecified: Secondary | ICD-10-CM | POA: Diagnosis not present

## 2016-04-29 DIAGNOSIS — L84 Corns and callosities: Secondary | ICD-10-CM | POA: Diagnosis not present

## 2016-04-29 DIAGNOSIS — L603 Nail dystrophy: Secondary | ICD-10-CM | POA: Diagnosis not present

## 2016-05-09 ENCOUNTER — Other Ambulatory Visit: Payer: Self-pay | Admitting: Internal Medicine

## 2016-05-09 ENCOUNTER — Other Ambulatory Visit: Payer: Self-pay | Admitting: *Deleted

## 2016-05-09 MED ORDER — LISINOPRIL 10 MG PO TABS: ORAL_TABLET | ORAL | 1 refills | Status: DC

## 2016-05-09 NOTE — Telephone Encounter (Signed)
[facility] 

## 2016-05-10 DIAGNOSIS — L239 Allergic contact dermatitis, unspecified cause: Secondary | ICD-10-CM | POA: Diagnosis not present

## 2016-05-25 ENCOUNTER — Encounter: Payer: Self-pay | Admitting: Internal Medicine

## 2016-05-25 ENCOUNTER — Ambulatory Visit (INDEPENDENT_AMBULATORY_CARE_PROVIDER_SITE_OTHER): Payer: Medicare Other | Admitting: Internal Medicine

## 2016-05-25 VITALS — BP 162/78 | HR 70 | Temp 97.4°F | Ht 65.0 in | Wt 115.8 lb

## 2016-05-25 DIAGNOSIS — L309 Dermatitis, unspecified: Secondary | ICD-10-CM

## 2016-05-25 DIAGNOSIS — L304 Erythema intertrigo: Secondary | ICD-10-CM | POA: Diagnosis not present

## 2016-05-25 MED ORDER — CLOTRIMAZOLE-BETAMETHASONE 1-0.05 % EX CREA
1.0000 | TOPICAL_CREAM | Freq: Two times a day (BID) | CUTANEOUS | 1 refills | Status: DC
Start: 2016-05-25 — End: 2016-12-13

## 2016-05-25 NOTE — Progress Notes (Signed)
Patient ID: [name] [name], female   DOB: 18-Mar-1932, 83 y.o.   MRN: [id number]    Location:  [facility] Place of Service: OFFICE  Chief Complaint  Patient presents with  . Acute Visit    started about a month ago rash started on face and spread to chest, has an itch and red    HPI:  83 yo female seen today for rash. She was seen at [facility] for itchy facial rash. She was rx hydrocortisone cream which helped. She states rash became "hard" prior to applying steroid cream. rash improved now but face still with dark areas. She also has rash under breasts that is itchy and also at b/l antecubital fossa. No blisters. No f/c. She is a poor historian due to memory loss. Hx obtained from chart  Past Medical History:  Diagnosis Date  . Arthritis    Osteoarthritis  . Chicken pox    as a child  . Hypertension   . Mumps    as a child  . MVP (mitral valve prolapse)     Past Surgical History:  Procedure Laterality Date  . ABDOMINAL HYSTERECTOMY    . CATARACT EXTRACTION Right 11 2013  . EYE SURGERY    . 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 TONSILLECTOMY AND ADENOIDECTOMY      Patient Care Team: Gildardo Cranker, DO as PCP - General (Internal Medicine)  Social History   Social History  . Marital status: Widowed    Spouse name: N/A  . Number of children: N/A  . Years of education: N/A   Occupational History  . Not on file.   Social History Main Topics  . Smoking status: Never Smoker  . Smokeless tobacco: Never Used  . Alcohol use Yes     Comment: rarely  . Drug use: No  . Sexual activity: Not on file   Other Topics Concern  . Not on file   Social History Narrative   Retired since 2004. [employer] school in [place] masters degree widowed.  re located to [place]         Diet:   Do you drink/eat things with caffeine? Sometimes   Marital status: Widow                          What year were you married?   Do you live in a house, apartment, assisted living, condo,  trailer, etc)? Condo   Is it one or more stories? Yes   How many persons live in your home? 1   Do you have any pets in your home? 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 Yes                                                    Type & how often: walking daily   Do you have a living will?  No   Do you have a DNR Form? No   Do you have a POA/HPOA forms? Yes           reports that she has never smoked. She has never used smokeless tobacco. She reports that she drinks alcohol. She reports that she does not use drugs.  Family History  Problem Relation Age of Onset  . Alzheimer's disease Mother   . Cancer Father   . Diabetes Sister   . Hypertension Sister   . Hypertension Brother   . Diabetes Brother    Family Status  Relation Status  . Mother Deceased at age 26   Died of senile dementia  . Father Deceased   Lung cancer  . Sister   . Brother      No Known Allergies  Medications: Patient's Medications  New Prescriptions   CLOTRIMAZOLE-BETAMETHASONE (LOTRISONE) CREAM    Apply 1 application topically 2 (two) times daily. Use for 2 weeks under each breast  Previous Medications   ASPIRIN EC 81 MG TABLET    Take 81 mg by mouth daily.   HYDROCHLOROTHIAZIDE (HYDRODIURIL) 25 MG TABLET    Take one tablet by mouth once daily   HYDROCORTISONE CREAM 1 %    Apply 1 application topically 2 (two) times daily.   LISINOPRIL (PRINIVIL,ZESTRIL) 10 MG TABLET    Take one tablet by mouth once daily to control blood pressure   POTASSIUM CHLORIDE SA (KLOR-CON M20) 20 MEQ TABLET    TAKE 1 TABLET DAILY  Modified Medications   No medications on file  Discontinued Medications   IBUPROFEN (ADVIL,MOTRIN) 800 MG TABLET    Take 800 mg by mouth as needed.    Review of Systems  Unable to perform ROS: Dementia    Vitals:   05/25/16 1118  BP: (!) 162/78  Pulse: 70  Temp: 97.4 F (36.3 C)  TempSrc: Oral  SpO2: 94%  Weight: 115 lb 12.8 oz (52.5 kg)    Height: 5\' 5"  (1.651 m)   Body mass index is 19.27 kg/m.  Physical Exam  Constitutional: She appears well-developed and well-nourished. No distress.  Musculoskeletal: She exhibits edema. She exhibits no tenderness.  Neurological: She is alert.  Skin: Skin is warm, dry and intact. Rash noted. Rash is papular (red papulosquamous rash in flexural areas).     Psychiatric: She has a normal mood and affect. Her behavior is normal.     Labs reviewed: Office Visit on 03/30/2016  Component Date Value Ref Range Status  . Sodium 03/30/2016 144  135 - 146 mmol/L Final  . Potassium 03/30/2016 4.2  3.5 - 5.3 mmol/L Final  . Chloride 03/30/2016 104  98 - 110 mmol/L Final  . CO2 03/30/2016 32* 20 - 31 mmol/L Final  . Glucose, Bld 03/30/2016 61* 65 - 99 mg/dL Final  . BUN 03/30/2016 33* 7 - 25 mg/dL Final  . Creat 03/30/2016 1.12* 0.60 - 0.88 mg/dL Final   Comment:   For patients > or = 81 years of age: The upper reference limit for Creatinine is approximately 13% higher for people identified as African-American.     . Calcium 03/30/2016 9.3  8.6 - 10.4 mg/dL Final  . GFR, Est African American 03/30/2016 53* >=60 mL/min Final  . GFR, Est Non African American 03/30/2016 46* >=60 mL/min Final    No results found.   Assessment/Plan   ICD-9-CM ICD-10-CM   1. Eczema, unspecified type 692.9 L30.9   2. Eczema intertrigo 695.89 L30.4 clotrimazole-betamethasone (LOTRISONE) cream    Use hydrocortisone cream as needed to rash on arms and face  apply lotrisone cream under each breast x 2 weeks  Use moisturizing lotion (curel, eucerin, jergen's, etc) at least 2 times daily   Use dye/parfume free laundry detergents  Take lukewarm baths/showers  Follow up as scheduled or sooner if not feeling better  Jahmai Finelli S. Perlie Gold  [facility] [address] [place], [place] [address] [phone] Cell (Monday-Friday 8 AM - 5 PM) [phone]  After 5 PM and follow prompts

## 2016-05-25 NOTE — Patient Instructions (Signed)
Use hydrocortisone cream as needed to rash on arms and face  apply lotrisone cream under each breast x 2 weeks  Use moisturizing lotion (curel, eucerin, jergen's, etc) at least 2 times daily   Use dye/parfume free laundry detergents  Take lukewarm baths/showers  Follow up as scheduled or sooner if not feeling better   Atopic Dermatitis Atopic dermatitis is a skin disorder that causes inflammation of the skin. This is the most common type of eczema. Eczema is a group of skin conditions that cause the skin to be itchy, red, and swollen. This condition is generally worse during the cooler winter months and often improves during the warm summer months. Symptoms can vary from person to person. Atopic dermatitis usually starts showing signs in infancy and can last through adulthood. This condition cannot be passed from one person to another (non-contagious), but is more common in families. Atopic dermatitis may not always be present. When it is present, it is called a flare-up. What are the causes? The exact cause of this condition is not known. Flare-ups of the condition may be triggered by:  Contact with something you are sensitive or allergic to.  Stress.  Certain foods.  Extremely hot or cold weather.  Harsh chemicals and soaps.  Dry air.  Chlorine. What increases the risk? This condition is more likely to develop in people who have a personal history or family history of eczema, allergies, asthma, or hay fever. What are the signs or symptoms? Symptoms of this condition include:  Dry, scaly skin.  Red, itchy rash.  Itchiness, which can be severe. This may occur before the skin rash. This can make sleeping difficult.  Skin thickening and cracking can occur over time. How is this diagnosed? This condition is diagnosed based on your symptoms, a medical history, and a physical exam. How is this treated? There is no cure for this condition, but symptoms can usually be  controlled. Treatment focuses on:  Controlling the itching and scratching. You may be given medicines, such as antihistamines or steroid creams.  Limiting exposure to things that you are sensitive or allergic to (allergens).  Recognizing situations that cause stress and developing a plan to manage stress. If your atopic dermatitis does not get better with medicines or is all over your body (widespread) , a treatment using a specific type of light (phototherapy) may be used. Follow these instructions at home: Skin care  Keep your skin well-moisturized. This seals in moisture and help prevent dryness.  Use unscented lotions that have petroleum in them.  Avoid lotions that contain alcohol and water. They can dry the skin.  Keep baths or showers short (less than 5 minutes) in warm water. Do not use hot water.  Use mild, unscented cleansers for bathing. Avoid soap and bubble bath.  Apply a moisturizer to your skin right after a bath or shower.   Do not apply anything to your skin without checking with your health care provider. General instructions  Dress in clothes made of cotton or cotton blends. Dress lightly because heat increases itching.  When washing your clothes, rinse your clothes twice so all of the soap is removed.  Avoid any triggers that can cause a flare-up.  Try to manage your stress.  Keep your fingernails cut short.  Avoid scratching. Scratching makes the rash and itching worse. It may also result in a skin infection (impetigo) due to a break in the skin caused by scratching.  Take or apply over-the-counter and prescription medicines  only as told by your health care provider.  Keep all follow-up visits as told by your health care provider. This is important.  Do not be around people who have cold sores or fever blisters. If you get the infection, it may cause your atopic dermatitis to worsen. Contact a health care provider if:  Your itching interferes with  sleep.  Your rash gets worse or is not better within one week of starting treatment.  You have a fever.  You have a rash flare-up after having contact with someone who has cold sores or fever blisters. Get help right away if:  You develop pus or soft yellow scabs in the rash area. Summary  This condition causes a red rash and itchy, dry, scaly skin.  Treatment focuses on controlling the itching and scratching, limiting exposure to things that you are sensitive or allergic to (allergens), and recognizing situations that cause stress and developing a plan to manage stress.  Keep your skin well-moisturized.  Keep baths or showers less than 5 minutes. This information is not intended to replace advice given to you by your health care provider. Make sure you discuss any questions you have with your health care provider. Document Released: 03/25/2000 Document Revised: 09/03/2015 Document Reviewed: 10/29/2012 Elsevier Interactive Patient Education  2017 [employer].

## 2016-06-05 ENCOUNTER — Other Ambulatory Visit: Payer: Self-pay | Admitting: Internal Medicine

## 2016-06-21 ENCOUNTER — Telehealth: Payer: Self-pay

## 2016-06-21 ENCOUNTER — Ambulatory Visit
Admission: RE | Admit: 2016-06-21 | Discharge: 2016-06-21 | Disposition: A | Discharge status: Home / Self Care | Payer: Medicare Other | Source: Ambulatory Visit | Attending: Internal Medicine | Admitting: Internal Medicine

## 2016-06-21 DIAGNOSIS — K1379 Other lesions of oral mucosa: Secondary | ICD-10-CM

## 2016-06-21 DIAGNOSIS — R220 Localized swelling, mass and lump, head: Secondary | ICD-10-CM | POA: Diagnosis not present

## 2016-06-21 NOTE — Telephone Encounter (Signed)
I received a call from De[place]roft with [facility] and she stated that patient came in today to have Neck Soft Tissue imaging done that was ordered on Mar 30 2016. Almyra Free stated that patient no longer had throat nodule and patient thought she was coming in due to a cough. I called Dr. Eulas Post and she stated that Almyra Free should go ahead with orders as they were ordered. Almyra Free verbalized understanding.

## 2016-06-23 ENCOUNTER — Other Ambulatory Visit: Payer: Self-pay

## 2016-06-23 DIAGNOSIS — K1379 Other lesions of oral mucosa: Secondary | ICD-10-CM

## 2016-07-13 ENCOUNTER — Telehealth: Payer: Self-pay | Admitting: Internal Medicine

## 2016-07-13 DIAGNOSIS — Z0000 Encounter for general adult medical examination without abnormal findings: Secondary | ICD-10-CM | POA: Diagnosis not present

## 2016-07-13 NOTE — Telephone Encounter (Signed)
left msg asking pt to confirm this AWV appt w/ nurse. VDM (DD) °

## 2016-07-15 ENCOUNTER — Ambulatory Visit
Admission: RE | Admit: 2016-07-15 | Discharge: 2016-07-15 | Disposition: A | Discharge status: Home / Self Care | Payer: Medicare Other | Source: Ambulatory Visit | Attending: Internal Medicine | Admitting: Internal Medicine

## 2016-07-15 DIAGNOSIS — R221 Localized swelling, mass and lump, neck: Secondary | ICD-10-CM | POA: Diagnosis not present

## 2016-07-15 DIAGNOSIS — K1379 Other lesions of oral mucosa: Secondary | ICD-10-CM

## 2016-07-15 MED ORDER — IOPAMIDOL (ISOVUE-300) INJECTION 61%
75.0000 mL | Freq: Once | INTRAVENOUS | Status: AC | PRN
  Administered 2016-07-15: 75 mL via INTRAVENOUS

## 2016-08-01 DIAGNOSIS — L84 Corns and callosities: Secondary | ICD-10-CM | POA: Diagnosis not present

## 2016-08-01 DIAGNOSIS — L603 Nail dystrophy: Secondary | ICD-10-CM | POA: Diagnosis not present

## 2016-08-01 DIAGNOSIS — I739 Peripheral vascular disease, unspecified: Secondary | ICD-10-CM | POA: Diagnosis not present

## 2016-08-15 IMAGING — CT CT CHEST W/O CM
3 of 4 series · 17 of 30 positions shown, 19 images · non-contrast
Comparison: None

CLINICAL DATA: Evaluate for emphysema.  Weight loss.

EXAM:
CT CHEST WITHOUT CONTRAST
TECHNIQUE: Multidetector CT imaging of the chest was performed following the
standard protocol without IV contrast.

[Series 3: chest w/o · axial · non-contrast · 0.57mm/px · z∈[-278,-38]mm · 5 of 72 slices shown]
[im 12/72  lung]
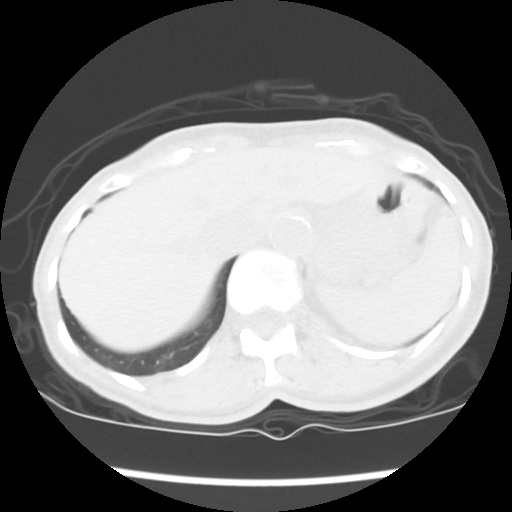
[im 24/72  lung]
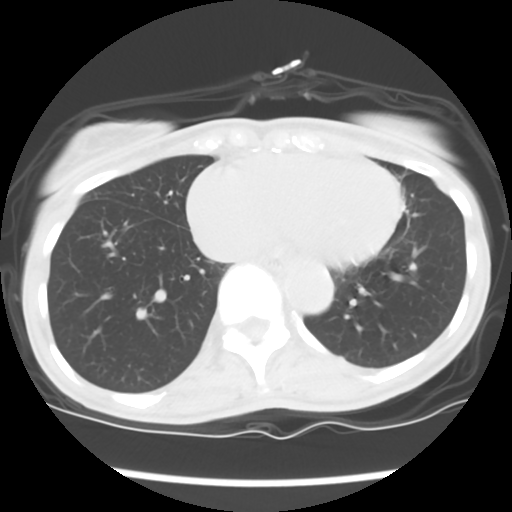
[im 36/72  lung]
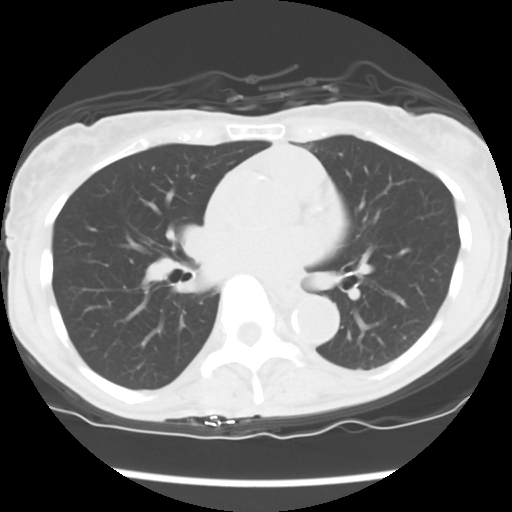
[im 48/72  lung]
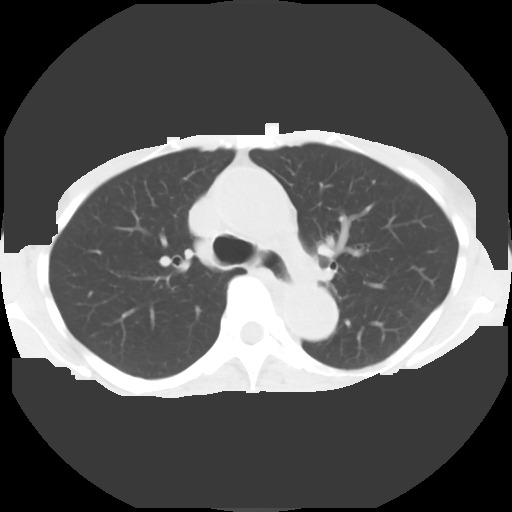
[im 60/72  lung]
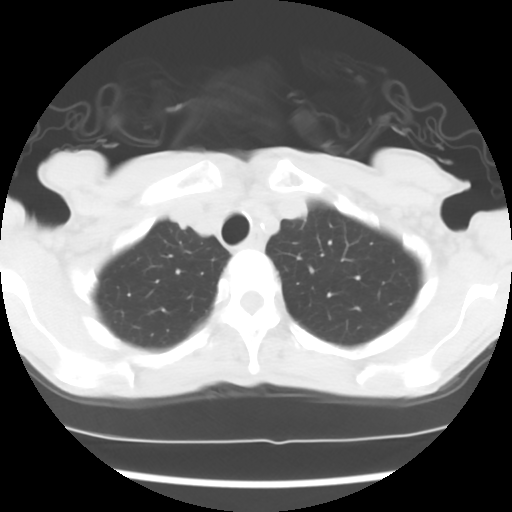

[Series 4: lung windows · axial · 0.57mm/px · z∈[-283,-33]mm · 6 of 72 slices shown, 8 images]
[im 11/72  mediastinal]
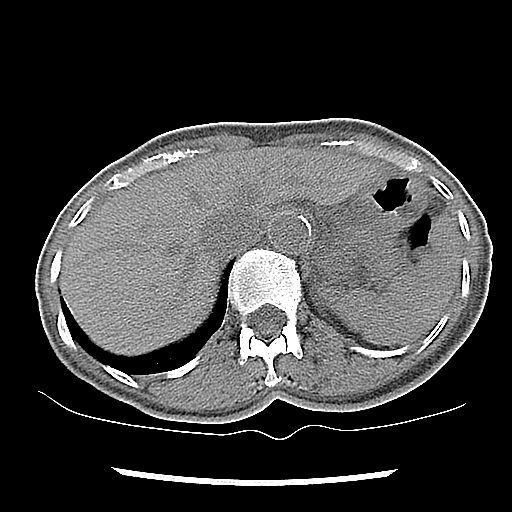
[im 11/72  lung]
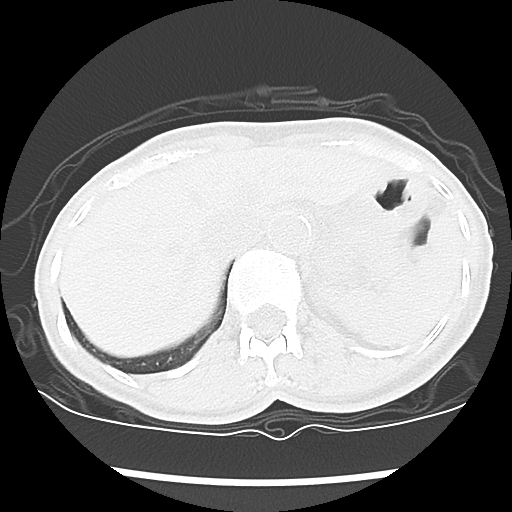
[im 21/72  lung]
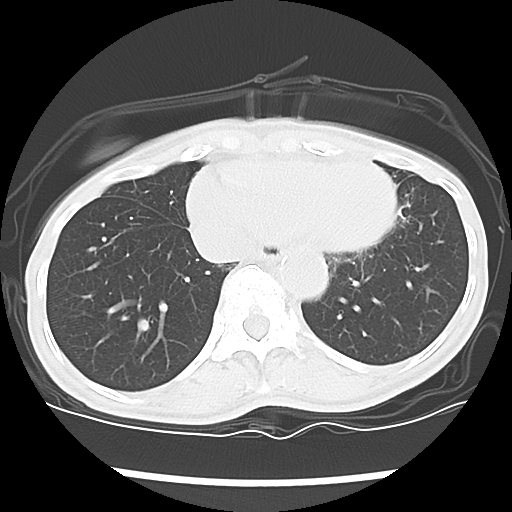
[im 31/72  lung]
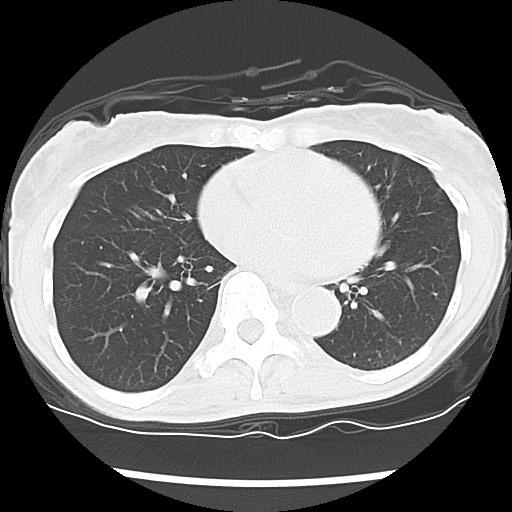
[im 41/72  lung]
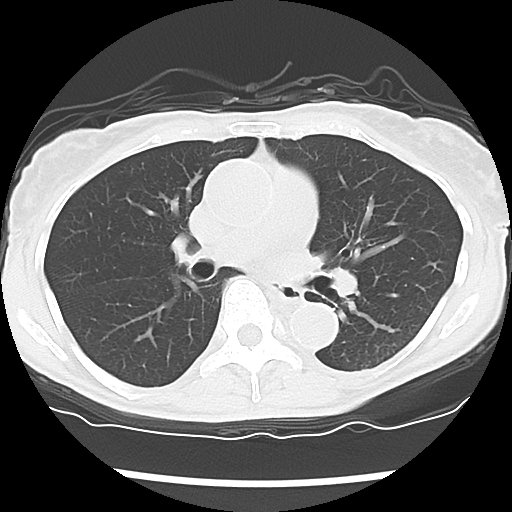
[im 51/72  mediastinal]
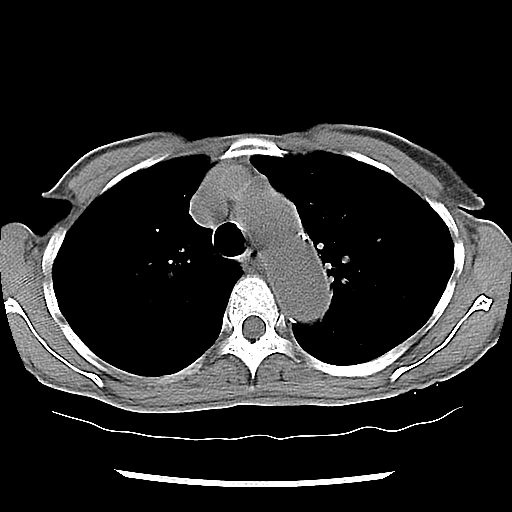
[im 51/72  lung]
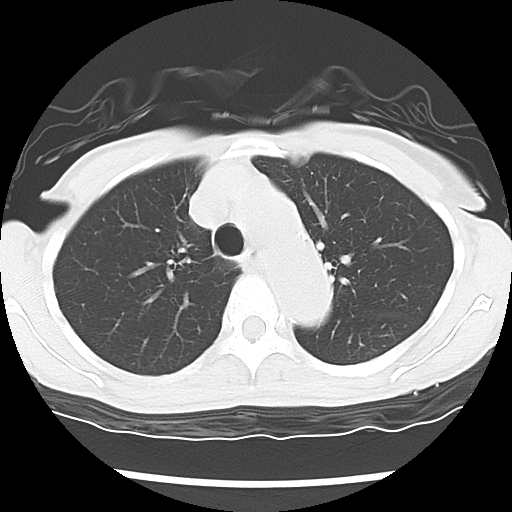
[im 61/72  lung]
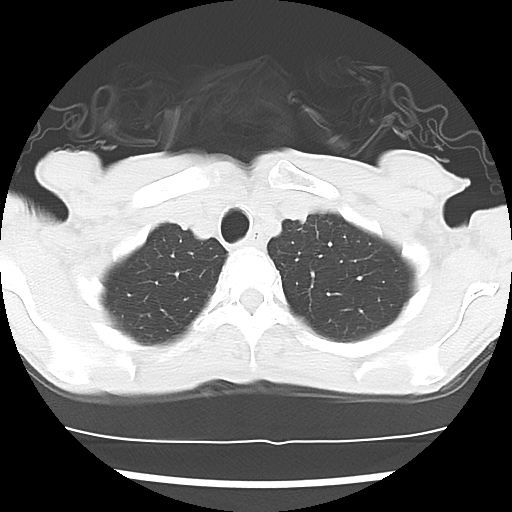

[Series 602: sagittal body · sagittal · 0.69mm/px · 6 of 117 slices shown]
[im 10/117  mediastinal]
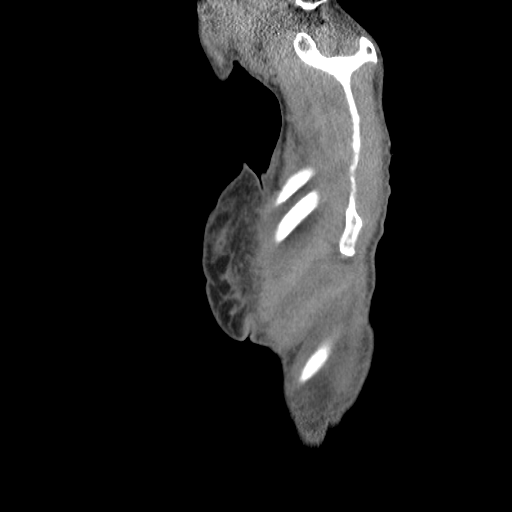
[im 30/117  mediastinal]
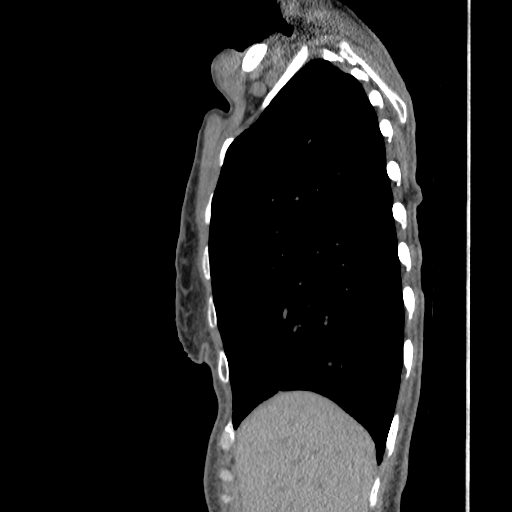
[im 39/117  mediastinal]
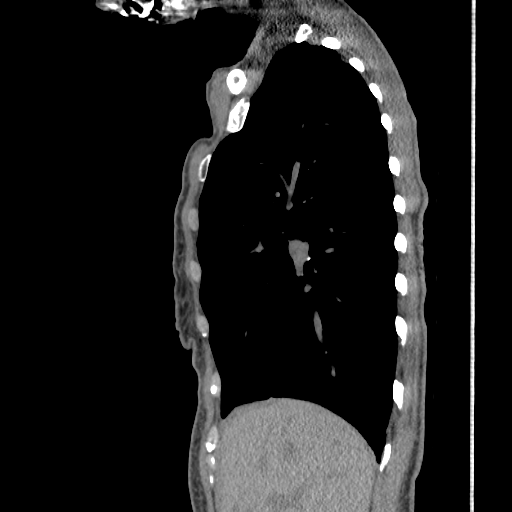
[im 49/117  mediastinal]
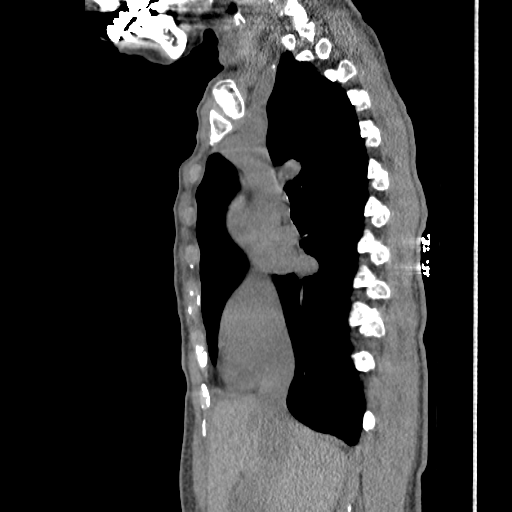
[im 68/117  mediastinal]
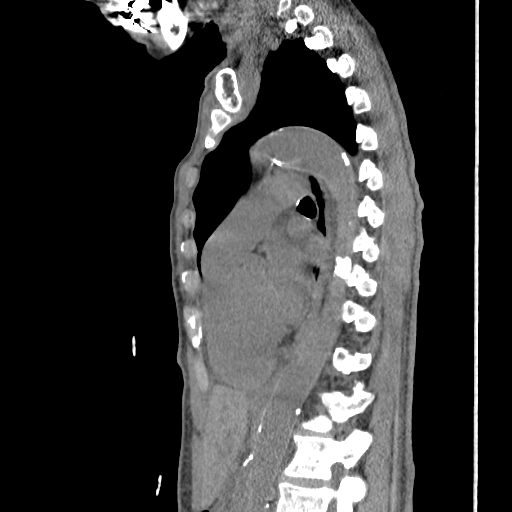
[im 78/117  mediastinal]
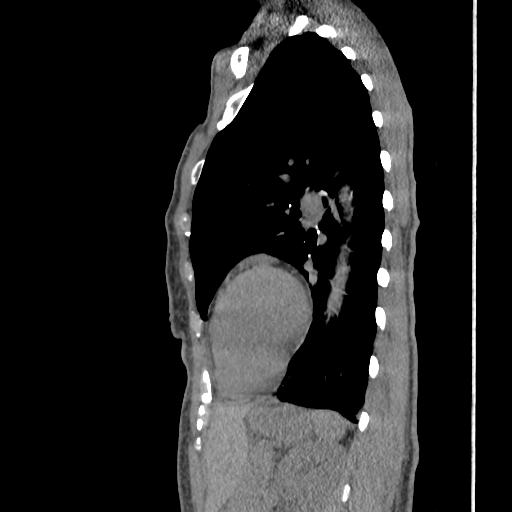

[17 of 30 positions shown; findings below may reference images not displayed]

FINDINGS: Mediastinum: The heart size appears normal. There is no pericardial
effusion. Aortic atherosclerosis is identified. Calcification within
the LAD coronary artery noted. No mediastinal or hilar adenopathy.

Lungs/Pleura: No pleural fluid identified. Diffuse bronchial wall
thickening is identified. Mild cylindrical type are bronchiectasis
is identified in both lungs. For example, left upper lobe anteriorly
image number 27 of series 4. And right middle lobe, image 38 of
series 4.

Upper Abdomen: The visualized portions of the liver and spleen are
within normal limits. The adrenal glands are negative.

Musculoskeletal: No aggressive lytic or sclerotic bone lesion
identified.
IMPRESSION: 1. No evidence for emphysema.
2. Mild bronchial wall thickening and bronchiectasis.
3. Aortic atherosclerosis

## 2016-08-18 ENCOUNTER — Telehealth: Payer: Self-pay

## 2016-08-18 NOTE — Telephone Encounter (Signed)
Patients son stated that there is a new Ensure live and it has 30 grams of protein instead of 20 and wanted to know if she should be taking the one that has 30 grams of protein. Due to mom not gaining weight and not eating as much. He was told that there is no end date for Ensure. So he is communicating with a company who can deliver it to her. Please advise

## 2016-08-19 NOTE — Telephone Encounter (Signed)
This is okay.

## 2016-08-19 NOTE — Telephone Encounter (Signed)
Left voicemail for patient's son informing him that it is ok for mom to take the 30 grams of Ensure Live.

## 2016-08-24 DIAGNOSIS — H401232 Low-tension glaucoma, bilateral, moderate stage: Secondary | ICD-10-CM | POA: Diagnosis not present

## 2016-09-06 ENCOUNTER — Encounter: Payer: Self-pay | Admitting: Internal Medicine

## 2016-10-27 ENCOUNTER — Telehealth: Payer: Self-pay | Admitting: Internal Medicine

## 2016-10-27 DIAGNOSIS — H40023 Open angle with borderline findings, high risk, bilateral: Secondary | ICD-10-CM | POA: Diagnosis not present

## 2016-10-27 DIAGNOSIS — H04123 Dry eye syndrome of bilateral lacrimal glands: Secondary | ICD-10-CM | POA: Diagnosis not present

## 2016-10-27 DIAGNOSIS — H20042 Secondary noninfectious iridocyclitis, left eye: Secondary | ICD-10-CM | POA: Diagnosis not present

## 2016-10-27 NOTE — Telephone Encounter (Signed)
I left a message explaining that pt isn't due for AWV-S until April 2019.  I rescheduled and asked pt or niece to call us and confirm that they received this message.  I also reminded them of EV on 7/31 with Dr. Eulas Post. VDM (DD)

## 2016-10-28 ENCOUNTER — Encounter: Payer: Medicare Other | Admitting: Internal Medicine

## 2016-10-28 ENCOUNTER — Ambulatory Visit: Payer: Medicare Other

## 2016-11-08 ENCOUNTER — Ambulatory Visit (INDEPENDENT_AMBULATORY_CARE_PROVIDER_SITE_OTHER): Payer: Medicare Other | Admitting: Internal Medicine

## 2016-11-08 ENCOUNTER — Encounter: Payer: Self-pay | Admitting: Internal Medicine

## 2016-11-08 VITALS — BP 114/64 | HR 63 | Temp 97.3°F | Ht 65.0 in | Wt 114.0 lb

## 2016-11-08 DIAGNOSIS — D72819 Decreased white blood cell count, unspecified: Secondary | ICD-10-CM

## 2016-11-08 DIAGNOSIS — Z79899 Other long term (current) drug therapy: Secondary | ICD-10-CM

## 2016-11-08 DIAGNOSIS — Z0000 Encounter for general adult medical examination without abnormal findings: Secondary | ICD-10-CM | POA: Diagnosis not present

## 2016-11-08 DIAGNOSIS — M5136 Other intervertebral disc degeneration, lumbar region: Secondary | ICD-10-CM | POA: Diagnosis not present

## 2016-11-08 DIAGNOSIS — F0390 Unspecified dementia without behavioral disturbance: Secondary | ICD-10-CM | POA: Diagnosis not present

## 2016-11-08 DIAGNOSIS — R634 Abnormal weight loss: Secondary | ICD-10-CM | POA: Diagnosis not present

## 2016-11-08 DIAGNOSIS — I10 Essential (primary) hypertension: Secondary | ICD-10-CM

## 2016-11-08 LAB — CBC WITH DIFFERENTIAL/PLATELET
BASOS ABS: 21 {cells}/uL (ref 0–200)
Basophils Relative: 1 %
EOS ABS: 21 {cells}/uL (ref 15–500)
EOS PCT: 1 %
HEMATOCRIT: 41.2 % (ref 35.0–45.0)
HEMOGLOBIN: 13.3 g/dL (ref 11.7–15.5)
LYMPHS ABS: 756 {cells}/uL — AB (ref 850–3900)
Lymphocytes Relative: 36 %
MCH: 29.7 pg (ref 27.0–33.0)
MCHC: 32.3 g/dL (ref 32.0–36.0)
MCV: 92 fL (ref 80.0–100.0)
MONO ABS: 462 {cells}/uL (ref 200–950)
MPV: 10.6 fL (ref 7.5–12.5)
Monocytes Relative: 22 %
NEUTROS ABS: 840 {cells}/uL — AB (ref 1500–7800)
Neutrophils Relative %: 40 %
Platelets: 179 10*3/uL (ref 140–400)
RBC: 4.48 MIL/uL (ref 3.80–5.10)
RDW: 13 % (ref 11.0–15.0)
WBC: 2.1 10*3/uL — ABNORMAL LOW (ref 3.8–10.8)

## 2016-11-08 LAB — TSH: TSH: 2.66 m[IU]/L

## 2016-11-08 MED ORDER — RIVASTIGMINE 4.6 MG/24HR TD PT24: 4.6000 mg | MEDICATED_PATCH | Freq: Every day | TRANSDERMAL | 12 refills | Status: DC

## 2016-11-08 NOTE — Patient Instructions (Addendum)
Write things down to help memory   Will call with lab results  [place]  Follow up in 1 month for dementia  Keeping You Healthy  Get These Tests  Blood Pressure- Have your blood pressure checked by your healthcare provider at least once a year.  Normal blood pressure is 120/80.  Weight- Have your body mass index (BMI) calculated to screen for obesity.  BMI is a measure of body fat based on height and weight.  You can calculate your own BMI at GravelBags.it  Cholesterol- Have your cholesterol checked every year.  Diabetes- Have your blood sugar checked every year if you have high blood pressure, high cholesterol, a family history of diabetes or if you are overweight.  Pap Test - Have a pap test every 1 to 5 years if you have been sexually active.  If you are older than 65 and recent pap tests have been normal you may not need additional pap tests.  In addition, if you have had a hysterectomy  for benign disease additional pap tests are not necessary.  Mammogram-Yearly mammograms are essential for early detection of breast cancer  Screening for Colon Cancer- Colonoscopy starting at age 57. Screening may begin sooner depending on your family history and other health conditions.  Follow up colonoscopy as directed by your Gastroenterologist.  Screening for Osteoporosis- Screening begins at age 49 with bone density scanning, sooner if you are at higher risk for developing Osteoporosis.  Get these medicines  Calcium with Vitamin D- Your body requires 1200-1500 mg of Calcium a day and [phone] IU of Vitamin D a day.  You can only absorb 500 mg of Calcium at a time therefore Calcium must be taken in 2 or 3 separate doses throughout the day.  Hormones- Hormone therapy has been associated with increased risk for certain cancers and heart disease.  Talk to your healthcare provider about if you need relief from menopausal symptoms.  Aspirin- Ask your healthcare provider  about taking Aspirin to prevent Heart Disease and Stroke.  Get these Immuniztions  Flu shot- Every fall  Pneumonia shot- Once after the age of 2; if you are younger ask your healthcare provider if you need a pneumonia shot.  Tetanus- Every ten years.  Zostavax- Once after the age of 54 to prevent shingles.  Take these steps  Don't smoke- Your healthcare provider can help you quit. For tips on how to quit, ask your healthcare provider or go to www.smokefree.gov or call 1-800 QUIT-NOW.  Be physically active- Exercise 5 days a week for a minimum of 30 minutes.  If you are not already physically active, start slow and gradually work up to 30 minutes of moderate physical activity.  Try walking, dancing, bike riding, swimming, etc.  Eat a healthy diet- Eat a variety of healthy foods such as fruits, vegetables, whole grains, low fat milk, low fat cheeses, yogurt, lean meats, chicken, fish, eggs, dried beans, tofu, etc.  For more information go to www.thenutritionsource.org  Dental visit- Brush and floss teeth twice daily; visit your dentist twice a year.  Eye exam- Visit your Optometrist or Ophthalmologist yearly.  Drink alcohol in moderation- Limit alcohol intake to one drink or less a day.  Never drink and drive.  Depression- Your emotional health is as important as your physical health.  If you're feeling down or losing interest in things you normally enjoy, please talk to your healthcare provider.  Seat Belts- can save your life; always wear one  Smoke/Carbon  Monoxide detectors- These detectors need to be installed on the appropriate level of your home.  Replace batteries at least once a year.  Violence- If anyone is threatening or hurting you, please tell your healthcare provider.  Living Will/ Health care power of attorney- Discuss with your healthcare provider and family.

## 2016-11-08 NOTE — Progress Notes (Signed)
Patient ID: [name] [name], female   DOB: 09-Feb-1932, 83 y.o.   MRN: [id number]   Location:  [facility]  Place of Service:  OFFICE  Provider: Arletha Grippe, DO  Patient Care Team: Gildardo Cranker, DO as PCP - General (Internal Medicine)  Extended Emergency Contact Information Primary Emergency Contact: Daniel Nones [place] Phone: [phone] Relation: Niece Secondary Emergency Contact: Harris,Gloria Address: [place], [place] [address] [place] Phone: [phone] Mobile Phone: [phone] Relation: Friend  Code Status: FULL CODE Goals of Care: Advanced Directive information Advanced Directives 11/08/2016  Does Patient Have a Medical Advance Directive? Yes  Type of Advance Directive [place]  Does patient want to make changes to medical advance directive? -  Copy of [place] in Chart? Yes  Would patient like information on creating a medical advance directive? -     Chief Complaint  Patient presents with  . Annual Exam    Complete physical   . Memory Loss    has notice a decrease, looking for things. Can't remember son's name today.   Marland Kitchen MMSE    22/30 failed clock drawing.   Marland Kitchen Health Maintenace    Prenavar vaccine to talk with doctor about it.     HPI: Patient is a 83 y.o. female seen in today for an annual wellness exam.  Weight down 1 lb. She repeats same statements. She is still driving.  She reports her memory is worse and she gets frustrated when she cannot remember something that she has known in the past. She states she is eating but weight has dropped about 1.5 lbs since Feb 2018. She continues to walk for exercise. Her niece has noticed a difference. She has tried namenda in the past but stopped med due to ADRs. S/w niece, [name], who is HCPOA, by phone x 24 minutes. appetite is good. Planning to have wellness visit.  Dementia - MMSE score 22/30  (previously 23/30). She was started on namenda XR at a previous OV. She is a poor historian due to dementia. Hx obtained from niece. She reported she had a reaction to the Namenda XR. She had 2 doses of namenda 21m left and awakened with "squeezing" pounding HA and she stopped taking the med. She also noticed strands of hair turned white. No rash. ARICEPT ineffective  Leukopenia - she saw hematology and determined that she has chronic leukopenia, likely congential associated with African-American heritage. No further w/u. She is asymptomatic  Chronic LBP - last L spine xray in 2016 showed multilevel DDD. She has pain with prolonged walking that resolves when she sits down. She takes ibuprofen prn. No numbness/tingling in LE. No falls  HTN - BP stable on lisinopril and HCTZ with K+ supplement   Dep[facility] screen [facility] 11/08/2016 05/25/2016 03/27/2015 06/28/2013  Decreased Interest 0 0 0 0  Down, Depressed, Hopeless 0 0 0 0  PHQ - 2 Score 0 0 0 0    Fall Risk  11/08/2016 05/25/2016 03/30/2016 10/07/2015 06/12/2015  Falls in the past year? No No No No No   MMSE - Mini Mental State Exam 11/08/2016 05/13/2015  Not completed: - (No Data)  Orientation to time 2 4  Orientation to Place 4 3  Registration 3 3  Attention/ Calculation 5 5  Recall 0 0  Language- name 2 objects 2 2  Language- repeat 1 1  Language- follow  3 step command 2 3  Language- read & follow direction 1 1  Write a sentence 1 1  Copy design 1 0  Total score 22 23     Health Maintenance  Topic Date Due  . PNA vac Low Risk Adult (2 of 2 - PCV13) 12/03/2013  . INFLUENZA VACCINE  11/09/2016  . TETANUS/TDAP  08/23/2020  . DEXA SCAN  Completed    Urinary incontinence? No issues  Functional Status Survey: Is the patient deaf or have difficulty hearing?: No Does the patient have difficulty seeing, even when wearing glasses/contacts?: No Does the patient have difficulty concentrating, remembering, or making decisions?:  Yes Does the patient have difficulty walking or climbing stairs?: No Does the patient have difficulty dressing or bathing?: No Does the patient have difficulty doing errands alone such as visiting a doctor's office or shopping?: No  Exercise? walking  Diet? Healthy food choices  Vision Screening Comments: Eye exam several months ago, walk in  Hearing: no issues    Dentition: no concerns  Pain: stable  Past Medical History:  Diagnosis Date  . Arthritis    Osteoarthritis  . Chicken pox    as a child  . Hypertension   . Mumps    as a child  . MVP (mitral valve prolapse)     Past Surgical History:  Procedure Laterality Date  . ABDOMINAL HYSTERECTOMY    . CATARACT EXTRACTION Right 11 2013  . EYE SURGERY    . 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 TONSILLECTOMY AND ADENOIDECTOMY      Family History  Problem Relation Age of Onset  . Alzheimer's disease Mother   . Cancer Father   . Diabetes Sister   . Hypertension Sister   . Hypertension Brother   . Diabetes Brother    Family Status  Relation Status  . Mother Deceased at age 59       Died of senile dementia  . Father Deceased       Lung cancer  . Sister (Not Specified)  . Brother (Not Specified)  . Son Alive    Social History   Social History  . Marital status: Widowed    Spouse name: N/A  . Number of children: N/A  . Years of education: N/A   Occupational History  . retired Art therapist     Social History Main Topics  . Smoking status: Never Smoker  . Smokeless tobacco: Never Used  . Alcohol use Yes     Comment: rarely  . Drug use: No  . Sexual activity: No   Other Topics Concern  . Not on file   Social History Narrative   Retired since 2004. Thought middle school in [place] masters degree widowed.  re located to [place]         Diet:   Do you drink/eat things with caffeine? Sometimes   Marital status: Widow                          What year were you married?   Do  you live in a house, apartment, assisted living, condo, trailer, etc)? Condo   Is it one or more stories? Yes   How many persons live in your home? 1   Do you have any pets in your home? 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 Yes  Type & how often: walking daily   Do you have a living will?  No   Do you have a DNR Form? No   Do you have a POA/HPOA forms? Yes          No Known Allergies  Allergies as of 11/08/2016   No Known Allergies     Medication List       Accurate as of 11/08/16  4:00 PM. Always use your most recent med list.          aspirin EC 81 MG tablet Take 81 mg by mouth daily.   clotrimazole-betamethasone cream Commonly known as:  LOTRISONE Apply 1 application topically 2 (two) times daily. Use for 2 weeks under each breast   hydrochlorothiazide 25 MG tablet Commonly known as:  HYDRODIURIL TAKE ONE TABLET BY MOUTH ONCE DAILY   hydrocortisone cream 1 % Apply 1 application topically 2 (two) times daily.   lisinopril 10 MG tablet Commonly known as:  PRINIVIL,ZESTRIL Take one tablet by mouth once daily to control blood pressure   potassium chloride SA 20 MEQ tablet Commonly known as:  KLOR-CON M20 TAKE 1 TABLET DAILY        Review of Systems:  Review of Systems  Unable to perform ROS: Dementia    Physical Exam: Vitals:   11/08/16 1504  BP: 114/64  Pulse: 63  Temp: (!) 97.3 F (36.3 C)  TempSrc: Oral  SpO2: 98%  Weight: 114 lb (51.7 kg)  Height: 5' 5"  (1.651 m)   Body mass index is 18.97 kg/m. Physical Exam  Constitutional: She appears well-developed and well-nourished. She appears cachectic. No distress.  Very thin; frail appearing  HENT:  Head: Normocephalic and atraumatic.  Right Ear: External ear normal.  Left Ear: External ear normal.  Mouth/Throat: Oropharynx is clear and moist. No oropharyngeal exudate.  MMM; no oral thrush   Eyes: Pupils are equal, round, and reactive to light. EOM are normal. No scleral icterus.  Neck: Normal range of motion. Neck supple. Carotid bruit is not present. No tracheal deviation present. No thyromegaly present.  Cardiovascular: Normal rate, regular rhythm and intact distal pulses.  Exam reveals no gallop and no friction rub.   No murmur heard. No LE edema b/l. No calf TTP  Pulmonary/Chest: Effort normal and breath sounds normal. No respiratory distress. She has no wheezes. She has no rales. She exhibits no tenderness. Right breast exhibits no inverted nipple, no mass, no nipple discharge, no skin change and no tenderness. Left breast exhibits no inverted nipple, no mass, no nipple discharge, no skin change and no tenderness. Breasts are symmetrical.  No rhonchi  Abdominal: Soft. Bowel sounds are normal. She exhibits no distension and no mass. There is no hepatosplenomegaly or hepatomegaly. There is no tenderness. There is no rebound and no guarding. No hernia.  Musculoskeletal: She exhibits edema. She exhibits no deformity.  Lymphadenopathy:    She has no cervical adenopathy.  Neurological: She is alert.  Skin: Skin is warm and dry. No rash noted.  Psychiatric: She has a normal mood and affect. Her behavior is normal. Judgment normal.  Vitals reviewed.   Labs reviewed:  Basic Metabolic Panel:  Recent Labs  03/30/16 1457  NA 144  K 4.2  CL 104  CO2 32*  GLUCOSE 61*  BUN 33*  CREATININE 1.12*  CALCIUM 9.3   Liver Function Tests: No results for input(s): AST, ALT, ALKPHOS, BILITOT, PROT, ALBUMIN in the last 8760 hours. No results for input(s): LIPASE, AMYLASE  in the last 8760 hours. No results for input(s): AMMONIA in the last 8760 hours. CBC: No results for input(s): WBC, NEUTROABS, HGB, HCT, MCV, PLT in the last 8760 hours. Lipid Panel: No results for input(s): CHOL, HDL, LDLCALC, TRIG, CHOLHDL, LDLDIRECT in the last 8760 hours. No results found for:  HGBA1C  Procedures: No results found. ECG OBTAINED AND REVIEWED BY MYSELF: 1st degree AVB @ 54 bpm, nml axis, LAE, poor R wave progression. No acute ischemic changes. No change since 05/2015  Assessment/Plan   ICD-10-CM   1. Medicare annual wellness visit, subsequent Z00.00   2. Dementia without behavioral disturbance, unspecified dementia type F03.90 Lipid Panel    rivastigmine (EXELON) 4.6 mg/24hr   worsening; MMSE 22/30  3. Loss of weight R63.4 Lipid Panel    TSH  4. Essential hypertension I10 TSH    Urinalysis with Reflex Microscopic  5. DDD (degenerative disc disease), lumbar M51.36   6. Leukopenia, unspecified type D72.819 CBC with Differential/Platelets  7. High risk medication use Z79.899 CBC with Differential/Platelets    CMP with eGFR    Lipid Panel   Write things down to help memory. Handout on dementia given  T/c Vayacog vitamin for memory  Cont current meds as ordered  Will call with lab results  [place]  Follow up in 1 month for dementia  "Keeping You Healthy" handout given  Cordella Register. Perlie Gold  [facility] [address] [place], [place] [address] [phone] Cell (Monday-Friday 8 AM - 5 PM) [phone] After 5 PM and follow prompts

## 2016-11-09 LAB — LIPID PANEL
CHOLESTEROL: 198 mg/dL (ref <200)
HDL: 73 mg/dL (ref >50)
LDL Cholesterol: 117 mg/dL — ABNORMAL HIGH (ref <100)
Total CHOL/HDL Ratio: 2.7 Ratio (ref <5.0)
Triglycerides: 40 mg/dL (ref <150)
VLDL: 8 mg/dL (ref <30)

## 2016-11-09 LAB — URINALYSIS, ROUTINE W REFLEX MICROSCOPIC
Bilirubin Urine: NEGATIVE
Glucose, UA: NEGATIVE
HGB URINE DIPSTICK: NEGATIVE
Ketones, ur: NEGATIVE
NITRITE: NEGATIVE
PROTEIN: NEGATIVE
Specific Gravity, Urine: 1.011 (ref 1.001–1.035)
pH: 6.5 (ref 5.0–8.0)

## 2016-11-09 LAB — COMPLETE METABOLIC PANEL WITH GFR
ALBUMIN: 4 g/dL (ref 3.6–5.1)
ALK PHOS: 45 U/L (ref 33–130)
ALT: 38 U/L — ABNORMAL HIGH (ref 6–29)
AST: 42 U/L — ABNORMAL HIGH (ref 10–35)
BUN: 35 mg/dL — ABNORMAL HIGH (ref 7–25)
CALCIUM: 9.1 mg/dL (ref 8.6–10.4)
CO2: 31 mmol/L (ref 20–31)
Chloride: 102 mmol/L (ref 98–110)
Creat: 1.16 mg/dL — ABNORMAL HIGH (ref 0.60–0.88)
GFR, EST AFRICAN AMERICAN: 50 mL/min — AB (ref >=60)
GFR, EST NON AFRICAN AMERICAN: 44 mL/min — AB (ref >=60)
GLUCOSE: 62 mg/dL — AB (ref 65–99)
POTASSIUM: 4.3 mmol/L (ref 3.5–5.3)
SODIUM: 143 mmol/L (ref 135–146)
TOTAL PROTEIN: 7.3 g/dL (ref 6.1–8.1)
Total Bilirubin: 0.4 mg/dL (ref 0.2–1.2)

## 2016-11-09 LAB — URINALYSIS, MICROSCOPIC ONLY
Bacteria, UA: NONE SEEN [HPF]
CASTS: NONE SEEN [LPF]
Crystals: NONE SEEN [HPF]
RBC / HPF: NONE SEEN RBC/HPF (ref <=2)
Squamous Epithelial / LPF: NONE SEEN [HPF] (ref <=5)
WBC, UA: NONE SEEN WBC/HPF (ref <=5)
YEAST: NONE SEEN [HPF]

## 2016-11-28 ENCOUNTER — Other Ambulatory Visit: Payer: Self-pay | Admitting: Internal Medicine

## 2016-11-28 ENCOUNTER — Other Ambulatory Visit: Payer: Self-pay | Admitting: *Deleted

## 2016-11-28 MED ORDER — HYDROCHLOROTHIAZIDE 25 MG PO TABS: 25.0000 mg | ORAL_TABLET | Freq: Every day | ORAL | 1 refills | Status: DC

## 2016-11-28 NOTE — Telephone Encounter (Signed)
[name] [name] 

## 2016-12-13 ENCOUNTER — Encounter: Payer: Self-pay | Admitting: Internal Medicine

## 2016-12-13 ENCOUNTER — Ambulatory Visit (INDEPENDENT_AMBULATORY_CARE_PROVIDER_SITE_OTHER): Payer: Medicare Other | Admitting: Internal Medicine

## 2016-12-13 VITALS — BP 132/82 | HR 81 | Temp 97.7°F | Resp 17 | Ht 65.0 in | Wt 117.8 lb

## 2016-12-13 DIAGNOSIS — Z23 Encounter for immunization: Secondary | ICD-10-CM

## 2016-12-13 DIAGNOSIS — H6121 Impacted cerumen, right ear: Secondary | ICD-10-CM

## 2016-12-13 DIAGNOSIS — R634 Abnormal weight loss: Secondary | ICD-10-CM

## 2016-12-13 DIAGNOSIS — F0390 Unspecified dementia without behavioral disturbance: Secondary | ICD-10-CM

## 2016-12-13 NOTE — Progress Notes (Signed)
Patient ID: [name] [name], female   DOB: 1931-12-27, 83 y.o.   MRN: [id number]    Location:  [facility] Place of Service: OFFICE  Chief Complaint  Patient presents with  . Follow-up    Pt is being seen for a 1 month follow up on dementia. Pt would like flu vaccne today.   Saverio Danker in room     HPI:  83 yo female seen today for f/u dementia. She did not use exelon patch. She was c/a ADRs. She got lost a few weeks ago in the parking lot after [employer] show and it took 2 hrs before she found her car with assistance of someone else. Niece is looking into private duty nurse to assist with medication mx. She is a poor historian 2/2 dementia. Hx obtained from chart  She has tinnitus on right. She has discomfort and believes she needs ear lavaged.   Dementia - MMSE score 22/30 (previously 23/30). She was started on namenda XR at a previous OV. She is a poor historian due to dementia. Hx obtained from niece. She reported she had a reaction to the Namenda XR. She had 2 doses of namenda 29m left and awakened with "squeezing" pounding HA and she stopped taking the med. She also noticed strands of hair turned white. No rash. ARICEPT ineffective    Past Medical History:  Diagnosis Date  . Arthritis    Osteoarthritis  . Chicken pox    as a child  . Hypertension   . Mumps    as a child  . MVP (mitral valve prolapse)     Past Surgical History:  Procedure Laterality Date  . ABDOMINAL HYSTERECTOMY    . CATARACT EXTRACTION Right 11 2013  . EYE SURGERY    . 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 TONSILLECTOMY AND ADENOIDECTOMY      Patient Care Team: CGildardo Cranker DO as PCP - General (Internal Medicine)  Social History   Social History  . Marital status: Widowed    Spouse name: N/A  . Number of children: N/A  . Years of education: N/A   Occupational History  . retired mArt therapist    Social History Main Topics  . Smoking status: Never Smoker  . Smokeless tobacco:  Never Used  . Alcohol use Yes     Comment: rarely  . Drug use: No  . Sexual activity: No   Other Topics Concern  . Not on file   Social History Narrative   Retired since 2004. [employer] middle school in [employer] [place] widowed.  [place]        Diet:   Do you drink/eat things with caffeine? Sometimes   Marital status: Widow                          What year were you married?   Do you live in a house, apartment, assisted living, condo, trailer, etc)? Condo   Is it one or more stories? Yes   How many persons live in your home? 1   Do you have any pets in your home? 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 Yes  Type & how often: walking daily   Do you have a living will?  No   Do you have a DNR Form? No   Do you have a POA/HPOA forms? Yes           reports that she has never smoked. She has never used smokeless tobacco. She reports that she drinks alcohol. She reports that she does not use drugs.  Family History  Problem Relation Age of Onset  . Alzheimer's disease Mother   . Cancer Father   . Diabetes Sister   . Hypertension Sister   . Hypertension Brother   . Diabetes Brother    Family Status  Relation Status  . Mother Deceased at age 72       Died of senile dementia  . Father Deceased       Lung cancer  . Sister (Not Specified)  . Brother (Not Specified)  . Son Alive     No Known Allergies  Medications: Patient's Medications  New Prescriptions   No medications on file  Previous Medications   ASPIRIN EC 81 MG TABLET    Take 81 mg by mouth daily.   HYDROCHLOROTHIAZIDE (HYDRODIURIL) 25 MG TABLET    Take 1 tablet (25 mg total) by mouth daily.   LISINOPRIL (PRINIVIL,ZESTRIL) 10 MG TABLET    Take one tablet by mouth once daily to control blood pressure   POTASSIUM CHLORIDE SA (KLOR-CON M20) 20 MEQ TABLET    TAKE 1 TABLET  DAILY  Modified Medications   No medications on file  Discontinued Medications   CLOTRIMAZOLE-BETAMETHASONE (LOTRISONE) CREAM    Apply 1 application topically 2 (two) times daily. Use for 2 weeks under each breast   HYDROCORTISONE CREAM 1 %    Apply 1 application topically 2 (two) times daily.   RIVASTIGMINE (EXELON) 4.6 MG/24HR    Place 1 patch (4.6 mg total) onto the skin daily.    Review of Systems  Vitals:   12/13/16 1043  BP: 132/82  Pulse: 81  Resp: 17  Temp: 97.7 F (36.5 C)  TempSrc: Oral  SpO2: 98%  Weight: 117 lb 12.8 oz (53.4 kg)  Height: 5' 5"  (1.651 m)   Body mass index is 19.6 kg/m.  Physical Exam  Constitutional: She appears well-developed.  Thin, frail appearing in NAD  HENT:  Mouth/Throat: Oropharynx is clear and moist. No oropharyngeal exudate.  Right cerumen impaction; left external ear canal clear with intact TM; MMM;no oral thrush  Eyes: Pupils are equal, round, and reactive to light.  Neck: Neck supple.  Cardiovascular: Normal rate, regular rhythm, normal heart sounds and intact distal pulses.  Exam reveals no gallop and no friction rub.   No murmur heard. Pulmonary/Chest: Effort normal and breath sounds normal. No respiratory distress. She has no wheezes. She has no rales.  Musculoskeletal: She exhibits edema.  Lymphadenopathy:    She has no cervical adenopathy.  Neurological: She is alert.  Skin: Skin is warm and dry. No rash noted.  Psychiatric: She has a normal mood and affect. Her behavior is normal. Thought content normal.     Labs reviewed: Office Visit on 11/08/2016  Component Date Value Ref Range Status  . WBC 11/08/2016 2.1* 3.8 - 10.8 K/uL Final   Few atypical lymphocytes noted.  Marland Kitchen RBC 11/08/2016 4.48  3.80 - 5.10 MIL/uL Final  . Hemoglobin 11/08/2016 13.3  11.7 - 15.5 g/dL Final  . HCT 11/08/2016 41.2  35.0 - 45.0 % Final  . MCV 11/08/2016 92.0  80.0 - 100.0 fL Final  . MCH 11/08/2016 29.7  27.0 - 33.0 pg Final  . MCHC  11/08/2016 32.3  32.0 - 36.0 g/dL Final  . RDW 11/08/2016 13.0  11.0 - 15.0 % Final  . Platelets 11/08/2016 179  140 - 400 K/uL Final  . MPV 11/08/2016 10.6  7.5 - 12.5 fL Final  . Neutro Abs 11/08/2016 840* 1,500 - 7,800 cells/uL Final  . Lymphs Abs 11/08/2016 756* 850 - 3,900 cells/uL Final  . Monocytes Absolute 11/08/2016 462  200 - 950 cells/uL Final  . Eosinophils Absolute 11/08/2016 21  15 - 500 cells/uL Final  . Basophils Absolute 11/08/2016 21  0 - 200 cells/uL Final  . Neutrophils Relative % 11/08/2016 40  % Final  . Lymphocytes Relative 11/08/2016 36  % Final  . Monocytes Relative 11/08/2016 22  % Final  . Eosinophils Relative 11/08/2016 1  % Final  . Basophils Relative 11/08/2016 1  % Final  . Smear Review 11/08/2016 Criteria for review not met   Final  . Sodium 11/08/2016 143  135 - 146 mmol/L Final  . Potassium 11/08/2016 4.3  3.5 - 5.3 mmol/L Final  . Chloride 11/08/2016 102  98 - 110 mmol/L Final  . CO2 11/08/2016 31  20 - 31 mmol/L Final  . Glucose, Bld 11/08/2016 62* 65 - 99 mg/dL Final  . BUN 11/08/2016 35* 7 - 25 mg/dL Final  . Creat 11/08/2016 1.16* 0.60 - 0.88 mg/dL Final   Comment:   For patients > or = 81 years of age: The upper reference limit for Creatinine is approximately 13% higher for people identified as African-American.     . Total Bilirubin 11/08/2016 0.4  0.2 - 1.2 mg/dL Final  . Alkaline Phosphatase 11/08/2016 45  33 - 130 U/L Final  . AST 11/08/2016 42* 10 - 35 U/L Final  . ALT 11/08/2016 38* 6 - 29 U/L Final  . Total Protein 11/08/2016 7.3  6.1 - 8.1 g/dL Final  . Albumin 11/08/2016 4.0  3.6 - 5.1 g/dL Final  . Calcium 11/08/2016 9.1  8.6 - 10.4 mg/dL Final  . GFR, Est African American 11/08/2016 50* >=60 mL/min Final  . GFR, Est Non African American 11/08/2016 44* >=60 mL/min Final  . Cholesterol 11/08/2016 198  <200 mg/dL Final  . Triglycerides 11/08/2016 40  <150 mg/dL Final  . HDL 11/08/2016 73  >50 mg/dL Final  . Total CHOL/HDL Ratio  11/08/2016 2.7  <5.0 Ratio Final  . VLDL 11/08/2016 8  <30 mg/dL Final  . LDL Cholesterol 11/08/2016 117* <100 mg/dL Final  . TSH 11/08/2016 2.66  mIU/L Final   Comment:   Reference Range   > or = 20 Years  0.40-4.50   Pregnancy Range First trimester  0.26-2.66 Second trimester 0.55-2.73 Third trimester  0.43-2.91     . Color, Urine 11/08/2016 YELLOW  YELLOW Final  . APPearance 11/08/2016 CLEAR  CLEAR Final  . Specific Gravity, Urine 11/08/2016 1.011  1.001 - 1.035 Final  . pH 11/08/2016 6.5  5.0 - 8.0 Final  . Glucose, UA 11/08/2016 NEGATIVE  NEGATIVE Final  . Bilirubin Urine 11/08/2016 NEGATIVE  NEGATIVE Final  . Ketones, ur 11/08/2016 NEGATIVE  NEGATIVE Final  . Hgb urine dipstick 11/08/2016 NEGATIVE  NEGATIVE Final  . Protein, ur 11/08/2016 NEGATIVE  NEGATIVE Final  . Nitrite 11/08/2016 NEGATIVE  NEGATIVE Final  . Leukocytes, UA 11/08/2016 TRACE* NEGATIVE Final  . WBC, UA 11/08/2016 NONE SEEN  <=5 WBC/HPF Final  . RBC /  HPF 11/08/2016 NONE SEEN  <=2 RBC/HPF Final  . Squamous Epithelial / LPF 11/08/2016 NONE SEEN  <=5 HPF Final  . Bacteria, UA 11/08/2016 NONE SEEN  NONE SEEN HPF Final  . Crystals 11/08/2016 NONE SEEN  NONE SEEN HPF Final  . Casts 11/08/2016 NONE SEEN  NONE SEEN LPF Final  . Yeast 11/08/2016 NONE SEEN  NONE SEEN HPF Final    No results found.   Assessment/Plan   ICD-10-CM   1. Dementia without behavioral disturbance, unspecified dementia type F03.90 Ambulatory referral to [facility]  2. Loss of weight R63.4    improving  3. Need for immunization against influenza Z23 Flu Vaccine QUAD 36+ mos IM  4. Impacted cerumen of right ear H61.21     Ear lavage successful  Recommend you try to apply the exelon patch every day. Please remove old patch before you apply the new one  Referral to [place] other medications as ordered  Follow up in 1-2 mos for dementia   Lemmie Steinhaus S. Perlie Gold  [facility] [address] [place], [place] [address] [phone] Cell (Monday-Friday 8 AM - 5 PM) [phone] After 5 PM and follow prompts

## 2016-12-13 NOTE — Patient Instructions (Addendum)
Recommend you try to apply the exelon patch every day. Please remove old patch before you apply the new one  Referral to [place] other medications as ordered  Follow up in 1-2 mos for dementia

## 2016-12-23 ENCOUNTER — Telehealth: Payer: Self-pay | Admitting: Internal Medicine

## 2016-12-23 NOTE — Telephone Encounter (Signed)
I left a message with the patient's niece, Clint Guy, asking her to call me at [phone] about referral for medication and transportation assistance. VDM (DD)

## 2016-12-26 ENCOUNTER — Other Ambulatory Visit: Payer: Self-pay | Admitting: Internal Medicine

## 2016-12-27 ENCOUNTER — Encounter: Payer: Self-pay | Admitting: Internal Medicine

## 2017-01-26 DIAGNOSIS — I739 Peripheral vascular disease, unspecified: Secondary | ICD-10-CM | POA: Diagnosis not present

## 2017-01-26 DIAGNOSIS — L603 Nail dystrophy: Secondary | ICD-10-CM | POA: Diagnosis not present

## 2017-01-26 DIAGNOSIS — L84 Corns and callosities: Secondary | ICD-10-CM | POA: Diagnosis not present

## 2017-02-15 ENCOUNTER — Ambulatory Visit (INDEPENDENT_AMBULATORY_CARE_PROVIDER_SITE_OTHER): Payer: Medicare Other | Admitting: Internal Medicine

## 2017-02-15 ENCOUNTER — Encounter: Payer: Self-pay | Admitting: Internal Medicine

## 2017-02-15 VITALS — BP 120/70 | HR 69 | Temp 97.9°F | Resp 16 | Ht 65.0 in | Wt 116.0 lb

## 2017-02-15 DIAGNOSIS — I10 Essential (primary) hypertension: Secondary | ICD-10-CM | POA: Diagnosis not present

## 2017-02-15 DIAGNOSIS — R634 Abnormal weight loss: Secondary | ICD-10-CM

## 2017-02-15 DIAGNOSIS — F0390 Unspecified dementia without behavioral disturbance: Secondary | ICD-10-CM

## 2017-02-15 DIAGNOSIS — Z23 Encounter for immunization: Secondary | ICD-10-CM | POA: Diagnosis not present

## 2017-02-15 MED ORDER — LISINOPRIL 10 MG PO TABS: ORAL_TABLET | ORAL | 1 refills | Status: DC

## 2017-02-15 NOTE — Patient Instructions (Addendum)
Continue current medications as ordered  Prevnar vaccine given today  SCAT form signed and faxed  Follow up in 3 mos for dementia, HTN, weight loss

## 2017-02-15 NOTE — Progress Notes (Signed)
Patient ID: [name] [name], female   DOB: Aug 03, 1931, 83 y.o.   MRN: [id number]    Location:  [facility] Place of Service: OFFICE  Chief Complaint  Patient presents with  . Medical Management of Chronic Issues    1 month follow up for dementia  . Medication Refill    No refills needed at this time.   . Immunizations    Patient is intersted in the pneumoccocal vaccine.    HPI:  83 yo female seen today for f/u dementia. She is removing old exelon patch and applying a new one. Niece noticed she does not repeat herself as much but memory has not improved as much. Pt reports she is able to remember what she was thinking within 10 minutes when before it took several hrs. She is drinking protein drink several times per day. Weight down 1 lb since Sept 2018. She plays her piano every other day. She is a poor historian due to dementia. Hx obtained from chart.  Dementia - MMSE score 22/30 (previously 23/30). Improving overall on exelon patch. She was intolerant to namenda xr and namenda; aricept ineffective  HTN - BP stable on lisinopril and HCTZ + KCl. She takes ASA daily  Glaucoma - followed by eye provider. She uses [facility]ph[facility] P gtts  Past Medical History:  Diagnosis Date  . Arthritis    Osteoarthritis  . Chicken pox    as a child  . Hypertension   . Mumps    as a child  . MVP (mitral valve prolapse)     Past Surgical History:  Procedure Laterality Date  . ABDOMINAL HYSTERECTOMY    . CATARACT EXTRACTION Right 11 2013  . EYE SURGERY    . 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 TONSILLECTOMY AND ADENOIDECTOMY      Patient Care Team: Gildardo Cranker, DO as PCP - General (Internal Medicine)  Social History   Socioeconomic History  . Marital status: Widowed    Spouse name: Not on file  . Number of children: Not on file  . Years of education: Not on file  . Highest education level: Not on file  Social Needs  . Financial resource strain: Not on file  . Food insecurity - worry: Not on  file  . Food insecurity - inability: Not on file  . Transportation needs - medical: Not on file  . Transportation needs - non-medical: Not on file  Occupational History  . Occupation: retired Art therapist   Tobacco Use  . Smoking status: Never Smoker  . Smokeless tobacco: Never Used  Substance and Sexual Activity  . Alcohol use: Yes    Comment: rarely  . Drug use: No  . Sexual activity: No  Other Topics Concern  . Not on file  Social History Narrative   Retired since 2004. [employer] middle school in [place] masters degree widowed.  re located to [place]         Diet:   Do you drink/eat things with caffeine? Sometimes   Marital status: Widow                          What year were you married?   Do you live in a house, apartment, assisted living, condo, trailer, etc)? Condo   Is it one or more stories? Yes   How many persons live in your home? 1   Do you have any pets in your home?  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 Yes                                                    Type & how often: walking daily   Do you have a living will?  No   Do you have a DNR Form? No   Do you have a POA/HPOA forms? Yes        reports that  has never smoked. she has never used smokeless tobacco. She reports that she drinks alcohol. She reports that she does not use drugs.  Family History  Problem Relation Age of Onset  . Alzheimer's disease Mother   . Cancer Father   . Diabetes Sister   . Hypertension Sister   . Hypertension Brother   . Diabetes Brother    Family Status  Relation Name Status  . Mother Bonnita Nasuti Deceased at age 40       Died of senile dementia  . Father Tab Deceased       Lung cancer  . Sister  (Not Specified)  . Brother  (Not Specified)  . Son can't remember Alive     No Known Allergies  Medications:   Medication List        Accurate as of 02/15/17  3:32 PM. Always use your most recent med  list.          aspirin EC 81 MG tablet   brimonidine 0.1 % Soln Commonly known as:  ALPHAGAN P   hydrochlorothiazide 25 MG tablet Commonly known as:  HYDRODIURIL Take 1 tablet (25 mg total) by mouth daily.   KLOR-CON M20 20 MEQ tablet Generic drug:  potassium chloride SA TAKE ONE TABLET BY MOUTH ONCE DAILY   lisinopril 10 MG tablet Commonly known as:  PRINIVIL,ZESTRIL Take one tablet by mouth once daily to control blood pressure       Review of Systems  Unable to perform ROS: Dementia (memory loss)    Vitals:   02/15/17 1523  BP: 120/70  Pulse: 69  Resp: 16  Temp: 97.9 F (36.6 C)  TempSrc: Oral  SpO2: 99%  Weight: 116 lb (52.6 kg)  Height: 5' 5"  (1.651 m)   Body mass index is 19.3 kg/m.  Physical Exam  Constitutional: She appears well-developed.  Thin, frail appearing in NAD  HENT:  Mouth/Throat: Oropharynx is clear and moist. No oropharyngeal exudate.  MMM; no oral thrush  Eyes: Pupils are equal, round, and reactive to light. No scleral icterus.  Neck: Neck supple. Carotid bruit is not present. No tracheal deviation present. No thyromegaly present.  Cardiovascular: Normal rate, regular rhythm and intact distal pulses. Exam reveals no gallop and no friction rub.  Murmur (1/6 SEM) heard. No LE edema b/l. no calf TTP.   Pulmonary/Chest: Effort normal and breath sounds normal. No stridor. No respiratory distress. She has no wheezes. She has no rales.  Abdominal: Soft. Normal appearance and bowel sounds are normal. She exhibits no distension and no mass. There is no hepatomegaly. There is no tenderness. There is no rigidity, no rebound and no guarding. No hernia.  Musculoskeletal: She exhibits edema.  Lymphadenopathy:    She has no cervical adenopathy.  Neurological: She is alert.  Skin: Skin is warm and dry. No rash  noted.  Psychiatric: She has a normal mood and affect. Her behavior is normal.     Labs reviewed: No visits with results within 3 Month(s)  from this visit.  Latest known visit with results is:  Office Visit on 11/08/2016  Component Date Value Ref Range Status  . WBC 11/08/2016 2.1* 3.8 - 10.8 K/uL Final   Few atypical lymphocytes noted.  Marland Kitchen RBC 11/08/2016 4.48  3.80 - 5.10 MIL/uL Final  . Hemoglobin 11/08/2016 13.3  11.7 - 15.5 g/dL Final  . HCT 11/08/2016 41.2  35.0 - 45.0 % Final  . MCV 11/08/2016 92.0  80.0 - 100.0 fL Final  . MCH 11/08/2016 29.7  27.0 - 33.0 pg Final  . MCHC 11/08/2016 32.3  32.0 - 36.0 g/dL Final  . RDW 11/08/2016 13.0  11.0 - 15.0 % Final  . Platelets 11/08/2016 179  140 - 400 K/uL Final  . MPV 11/08/2016 10.6  7.5 - 12.5 fL Final  . Neutro Abs 11/08/2016 840* 1,500 - 7,800 cells/uL Final  . Lymphs Abs 11/08/2016 756* 850 - 3,900 cells/uL Final  . Monocytes Absolute 11/08/2016 462  200 - 950 cells/uL Final  . Eosinophils Absolute 11/08/2016 21  15 - 500 cells/uL Final  . Basophils Absolute 11/08/2016 21  0 - 200 cells/uL Final  . Neutrophils Relative % 11/08/2016 40  % Final  . Lymphocytes Relative 11/08/2016 36  % Final  . Monocytes Relative 11/08/2016 22  % Final  . Eosinophils Relative 11/08/2016 1  % Final  . Basophils Relative 11/08/2016 1  % Final  . Smear Review 11/08/2016 Criteria for review not met   Final  . Sodium 11/08/2016 143  135 - 146 mmol/L Final  . Potassium 11/08/2016 4.3  3.5 - 5.3 mmol/L Final  . Chloride 11/08/2016 102  98 - 110 mmol/L Final  . CO2 11/08/2016 31  20 - 31 mmol/L Final  . Glucose, Bld 11/08/2016 62* 65 - 99 mg/dL Final  . BUN 11/08/2016 35* 7 - 25 mg/dL Final  . Creat 11/08/2016 1.16* 0.60 - 0.88 mg/dL Final   Comment:   For patients > or = 81 years of age: The upper reference limit for Creatinine is approximately 13% higher for people identified as African-American.     . Total Bilirubin 11/08/2016 0.4  0.2 - 1.2 mg/dL Final  . Alkaline Phosphatase 11/08/2016 45  33 - 130 U/L Final  . AST 11/08/2016 42* 10 - 35 U/L Final  . ALT 11/08/2016 38* 6 - 29  U/L Final  . Total Protein 11/08/2016 7.3  6.1 - 8.1 g/dL Final  . Albumin 11/08/2016 4.0  3.6 - 5.1 g/dL Final  . Calcium 11/08/2016 9.1  8.6 - 10.4 mg/dL Final  . GFR, Est African American 11/08/2016 50* >=60 mL/min Final  . GFR, Est Non African American 11/08/2016 44* >=60 mL/min Final  . Cholesterol 11/08/2016 198  <200 mg/dL Final  . Triglycerides 11/08/2016 40  <150 mg/dL Final  . HDL 11/08/2016 73  >50 mg/dL Final  . Total CHOL/HDL Ratio 11/08/2016 2.7  <5.0 Ratio Final  . VLDL 11/08/2016 8  <30 mg/dL Final  . LDL Cholesterol 11/08/2016 117* <100 mg/dL Final  . TSH 11/08/2016 2.66  mIU/L Final   Comment:   Reference Range   > or = 20 Years  0.40-4.50   Pregnancy Range First trimester  0.26-2.66 Second trimester 0.55-2.73 Third trimester  0.43-2.91     . Color, Urine 11/08/2016 YELLOW  YELLOW Final  .  APPearance 11/08/2016 CLEAR  CLEAR Final  . Specific Gravity, Urine 11/08/2016 1.011  1.001 - 1.035 Final  . pH 11/08/2016 6.5  5.0 - 8.0 Final  . Glucose, UA 11/08/2016 NEGATIVE  NEGATIVE Final  . Bilirubin Urine 11/08/2016 NEGATIVE  NEGATIVE Final  . Ketones, ur 11/08/2016 NEGATIVE  NEGATIVE Final  . Hgb urine dipstick 11/08/2016 NEGATIVE  NEGATIVE Final  . Protein, ur 11/08/2016 NEGATIVE  NEGATIVE Final  . Nitrite 11/08/2016 NEGATIVE  NEGATIVE Final  . Leukocytes, UA 11/08/2016 TRACE* NEGATIVE Final  . WBC, UA 11/08/2016 NONE SEEN  <=5 WBC/HPF Final  . RBC / HPF 11/08/2016 NONE SEEN  <=2 RBC/HPF Final  . Squamous Epithelial / LPF 11/08/2016 NONE SEEN  <=5 HPF Final  . Bacteria, UA 11/08/2016 NONE SEEN  NONE SEEN HPF Final  . Crystals 11/08/2016 NONE SEEN  NONE SEEN HPF Final  . Casts 11/08/2016 NONE SEEN  NONE SEEN LPF Final  . Yeast 11/08/2016 NONE SEEN  NONE SEEN HPF Final    No results found.   Assessment/Plan   ICD-10-CM   1. Dementia without behavioral disturbance, unspecified dementia type F03.90   2. Loss of weight R63.4   3. Essential hypertension I10  lisinopril (PRINIVIL,ZESTRIL) 10 MG tablet  4. Need for vaccination with 13-polyvalent pneumococcal conjugate vaccine Z23 Pneumococcal conjugate vaccine 13-valent IM   Continue current medications as ordered  Prevnar vaccine given today  SCAT form signed and faxed  Follow up in 3 mos for dementia, HTN, weight loss   Jaylan Duggar S. Perlie Gold  [facility] [address] [place], [place] [address] [phone] Cell (Monday-Friday 8 AM - 5 PM) [phone] After 5 PM and follow prompts

## 2017-02-16 DIAGNOSIS — H401232 Low-tension glaucoma, bilateral, moderate stage: Secondary | ICD-10-CM | POA: Diagnosis not present

## 2017-03-23 DIAGNOSIS — L03019 Cellulitis of unspecified finger: Secondary | ICD-10-CM | POA: Diagnosis not present

## 2017-03-27 ENCOUNTER — Other Ambulatory Visit: Payer: Self-pay | Admitting: *Deleted

## 2017-03-27 DIAGNOSIS — I10 Essential (primary) hypertension: Secondary | ICD-10-CM

## 2017-03-27 MED ORDER — LISINOPRIL 10 MG PO TABS: ORAL_TABLET | ORAL | 1 refills | Status: DC

## 2017-03-27 MED ORDER — POTASSIUM CHLORIDE CRYS ER 20 MEQ PO TBCR: 20.0000 meq | EXTENDED_RELEASE_TABLET | Freq: Every day | ORAL | 1 refills | Status: DC

## 2017-03-27 NOTE — Telephone Encounter (Signed)
[employer] Francis King 

## 2017-05-17 ENCOUNTER — Ambulatory Visit: Payer: Medicare Other | Admitting: Internal Medicine

## 2017-05-23 ENCOUNTER — Ambulatory Visit (INDEPENDENT_AMBULATORY_CARE_PROVIDER_SITE_OTHER): Payer: Medicare Other | Admitting: Internal Medicine

## 2017-05-23 ENCOUNTER — Encounter: Payer: Self-pay | Admitting: Internal Medicine

## 2017-05-23 VITALS — BP 108/62 | HR 80 | Temp 97.7°F | Ht 65.0 in | Wt 118.0 lb

## 2017-05-23 DIAGNOSIS — F0390 Unspecified dementia without behavioral disturbance: Secondary | ICD-10-CM

## 2017-05-23 DIAGNOSIS — R634 Abnormal weight loss: Secondary | ICD-10-CM | POA: Diagnosis not present

## 2017-05-23 DIAGNOSIS — R4182 Altered mental status, unspecified: Secondary | ICD-10-CM

## 2017-05-23 DIAGNOSIS — I10 Essential (primary) hypertension: Secondary | ICD-10-CM | POA: Diagnosis not present

## 2017-05-23 MED ORDER — HYDROCHLOROTHIAZIDE 25 MG PO TABS: 25.0000 mg | ORAL_TABLET | Freq: Every day | ORAL | 0 refills | Status: DC

## 2017-05-23 MED ORDER — POTASSIUM CHLORIDE CRYS ER 20 MEQ PO TBCR: 20.0000 meq | EXTENDED_RELEASE_TABLET | Freq: Every day | ORAL | 0 refills | Status: DC

## 2017-05-23 MED ORDER — LISINOPRIL 10 MG PO TABS: ORAL_TABLET | ORAL | 0 refills | Status: DC

## 2017-05-23 NOTE — Patient Instructions (Signed)
Will call with lab results  Will contact pharmacy regarding remaining number of pills for HCTZ, lisinopril and Kdur  Recommend Assisted living facility  Continue home health with [facility]. Recommend RN (nurse) for medication management  Follow up in 1 month for dementia or sooner if no better

## 2017-05-23 NOTE — Progress Notes (Signed)
Patient ID: [name] [name], female   DOB: [birth year], 84 y.o.   MRN: [id number]   [facility] OFFICE  Provider: DR Arletha Grippe  Code Status: FULL CODE Goals of Care:  Advanced Directives 05/23/2017  Does Patient Have a Medical Advance Directive? Yes  Type of Advance Directive [place]  Does patient want to make changes to medical advance directive? -  Copy of [place] in Chart? Yes  Would patient like information on creating a medical advance directive? -     Chief Complaint  Patient presents with  . Follow-up    Patient with crisis- dementia worse and adverse reaction to exelon patch   . Home Care    FYI- Patient with personal care services x 7 days weekly (4 hours daily) through [facility]   . Medication Management    RX's missing, not sure what happened to them. Patient now has a 30 day pill organizer and medication is being managed by Neice     HPI: Patient is a 84 y.o. female seen today for an acute visit for change in mental status. She has hx dementia. exelon patch was stopped 2 weeks ago 2/2 HA, dizziness. Cognition began to change prior to stopping patch - increased anxiety, worrying about not having enough food, etc. Niece had local grocery store deliver food; started [facility] ([facility]) care aid 7 days per week with 8 hr days on Mon and Thurs; plan to get 30 day pill box as pt misplaced all of her Rx bottles.   She is a poor historian due to dementia. Hx obtained from chart and pt's niece who is present today  Past Medical History:  Diagnosis Date  . Arthritis    Osteoarthritis  . Chicken pox    as a child  . Hypertension   . Mumps    as a child  . MVP (mitral valve prolapse)     Past Surgical History:  Procedure Laterality Date  . ABDOMINAL HYSTERECTOMY    . CATARACT EXTRACTION Right 11 2013  . EYE SURGERY    . 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 TONSILLECTOMY AND ADENOIDECTOMY       reports that  has never smoked. she  has never used smokeless tobacco. She reports that she drinks alcohol. She reports that she does not use drugs. Social History   Socioeconomic History  . Marital status: Widowed    Spouse name: Not on file  . Number of children: Not on file  . Years of education: Not on file  . Highest education level: Not on file  Social Needs  . Financial resource strain: Not on file  . Food insecurity - worry: Not on file  . Food insecurity - inability: Not on file  . Transportation needs - medical: Not on file  . Transportation needs - non-medical: Not on file  Occupational History  . Occupation: retired Art therapist   Tobacco Use  . Smoking status: Never Smoker  . Smokeless tobacco: Never Used  Substance and Sexual Activity  . Alcohol use: Yes    Comment: rarely  . Drug use: No  . Sexual activity: No  Other Topics Concern  . Not on file  Social History Narrative   Retired since 2004. [employer] in [place] masters degree widowed.  re located to [place]         Diet:   Do you drink/eat things with caffeine?  Sometimes   Marital status: Widow                          What year were you married?   Do you live in a house, apartment, assisted living, condo, trailer, etc)? Condo   Is it one or more stories? Yes   How many persons live in your home? 1   Do you have any pets in your home? 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 Yes                                                    Type & how often: walking daily   Do you have a living will?  No   Do you have a DNR Form? No   Do you have a POA/HPOA forms? Yes       Family History  Problem Relation Age of Onset  . Alzheimer's disease Mother   . Cancer Father   . Diabetes Sister   . Hypertension Sister   . Hypertension Brother   . Diabetes Brother     Allergies  Allergen Reactions  . Exelon [Rivastigmine Tartrate] Other (See Comments)    Dizziness,  stomach aches, confusion and paranoid     Outpatient Encounter Medications as of 05/23/2017  Medication Sig  . aspirin EC 81 MG tablet Take 81 mg by mouth daily.  . brimonidine (ALPHAGAN P) 0.1 % SOLN Place 1 drop 2 (two) times daily into both eyes.  . hydrochlorothiazide (HYDRODIURIL) 25 MG tablet Take 1 tablet (25 mg total) by mouth daily.  Marland Kitchen lisinopril (PRINIVIL,ZESTRIL) 10 MG tablet Take one tablet by mouth once daily to control blood pressure  . potassium chloride SA (KLOR-CON M20) 20 MEQ tablet Take 1 tablet (20 mEq total) by mouth daily.  Marland Kitchen PREVIDENT 5000 ENAMEL PROTECT 1.1-5 % PSTE See admin instructions. Use daily as directed  . [DISCONTINUED] rivastigmine (EXELON) 4.6 mg/24hr Place 4.6 mg daily onto the skin.   No facility-administered encounter medications on file as of 05/23/2017.     Review of Systems:  Review of Systems  Unable to perform ROS: Dementia    Health Maintenance  Topic Date Due  . TETANUS/TDAP  08/23/2020  . INFLUENZA VACCINE  Completed  . DEXA SCAN  Completed  . PNA vac Low Risk Adult  Completed    Physical Exam: Vitals:   05/23/17 1401  BP: 108/62  Pulse: 80  Temp: 97.7 F (36.5 C)  TempSrc: Oral  SpO2: 94%  Weight: 118 lb (53.5 kg)  Height: 5' 5"  (1.651 m)   Body mass index is 19.64 kg/m. Physical Exam  Constitutional: She appears well-developed and well-nourished.  Frail appearing in NAD  HENT:  Mouth/Throat: Oropharynx is clear and moist. No oropharyngeal exudate.  MMM; no oral thrush  Eyes: Pupils are equal, round, and reactive to light. No scleral icterus.  Neck: Neck supple. Carotid bruit is not present. No tracheal deviation present. No thyromegaly present.  Cardiovascular: Normal rate, regular rhythm, normal heart sounds and intact distal pulses. Exam reveals no gallop and no friction rub.  No murmur heard. No LE edema b/l. no calf TTP.   Pulmonary/Chest: Effort normal and breath sounds normal. No stridor. No respiratory  distress. She  has no wheezes. She has no rales.  Abdominal: Soft. Normal appearance and bowel sounds are normal. She exhibits no distension and no mass. There is no hepatomegaly. There is no tenderness. There is no rigidity, no rebound and no guarding. No hernia.  Musculoskeletal: She exhibits edema.  Lymphadenopathy:    She has no cervical adenopathy.  Neurological: She is alert.  Skin: Skin is warm and dry. No rash noted.  Psychiatric: She has a normal mood and affect. Her behavior is normal. Thought content normal.    Labs reviewed: Basic Metabolic Panel: Recent Labs    11/08/16 1625  NA 143  K 4.3  CL 102  CO2 31  GLUCOSE 62*  BUN 35*  CREATININE 1.16*  CALCIUM 9.1  TSH 2.66   Liver Function Tests: Recent Labs    11/08/16 1625  AST 42*  ALT 38*  ALKPHOS 45  BILITOT 0.4  PROT 7.3  ALBUMIN 4.0   No results for input(s): LIPASE, AMYLASE in the last 8760 hours. No results for input(s): AMMONIA in the last 8760 hours. CBC: Recent Labs    11/08/16 1625  WBC 2.1*  NEUTROABS 840*  HGB 13.3  HCT 41.2  MCV 92.0  PLT 179   Lipid Panel: Recent Labs    11/08/16 1625  CHOL 198  HDL 73  LDLCALC 117*  TRIG 40  CHOLHDL 2.7   No results found for: HGBA1C  Procedures since last visit: No results found.  Assessment/Plan   ICD-10-CM   1. Altered mental status, unspecified altered mental status type r/o metabolic cause vs porgressive dementia R41.82 CBC with Differential/Platelets    CMP with eGFR([facility])    TSH    Urinalysis with Reflex Microscopic    Urine Culture  2. Loss of weight - stable R63.4 CBC with Differential/Platelets    CMP with eGFR([facility])    TSH    Urinalysis with Reflex Microscopic    Urine Culture  3. Dementia without behavioral disturbance, unspecified dementia type F03.90 Urinalysis with Reflex Microscopic    Urine Culture  4. Essential hypertension I10 lisinopril (PRINIVIL,ZESTRIL) 10 MG tablet   Will call with lab results  Will  contact pharmacy regarding remaining number of pills for HCTZ, lisinopril and Kdur  Recommend Assisted living facility  Continue home health with [facility]. Recommend RN (nurse) for medication management  Follow up in 1 month for dementia or sooner if no better   [facility] S. Perlie Gold  [facility] and Adult Medicine [address] [place], [place] [address] [phone] Cell (Monday-Friday 8 AM - 5 PM) [phone] After 5 PM and follow prompts

## 2017-05-24 LAB — URINALYSIS, ROUTINE W REFLEX MICROSCOPIC
Bilirubin Urine: NEGATIVE
GLUCOSE, UA: NEGATIVE
HGB URINE DIPSTICK: NEGATIVE
KETONES UR: NEGATIVE
Leukocytes, UA: NEGATIVE
NITRITE: NEGATIVE
PH: 5.5 (ref 5.0–8.0)
Protein, ur: NEGATIVE
SPECIFIC GRAVITY, URINE: 1.017 (ref 1.001–1.03)

## 2017-05-24 LAB — CBC WITH DIFFERENTIAL/PLATELET
BASOS ABS: 20 {cells}/uL (ref 0–200)
BASOS PCT: 0.8 %
EOS ABS: 20 {cells}/uL (ref 15–500)
Eosinophils Relative: 0.8 %
HEMATOCRIT: 37.1 % (ref 35.0–45.0)
Hemoglobin: 11.9 g/dL (ref 11.7–15.5)
LYMPHS ABS: 723 {cells}/uL — AB (ref 850–3900)
MCH: 28.8 pg (ref 27.0–33.0)
MCHC: 32.1 g/dL (ref 32.0–36.0)
MCV: 89.8 fL (ref 80.0–100.0)
MPV: 11.2 fL (ref 7.5–12.5)
Monocytes Relative: 16.7 %
NEUTROS ABS: 1320 {cells}/uL — AB (ref 1500–7800)
Neutrophils Relative %: 52.8 %
Platelets: 167 10*3/uL (ref 140–400)
RBC: 4.13 10*6/uL (ref 3.80–5.10)
RDW: 12.6 % (ref 11.0–15.0)
Total Lymphocyte: 28.9 %
WBC: 2.5 10*3/uL — ABNORMAL LOW (ref 3.8–10.8)
WBCMIX: 418 {cells}/uL (ref 200–950)

## 2017-05-24 LAB — COMPLETE METABOLIC PANEL WITH GFR
AG Ratio: 1.2 (calc) (ref 1.0–2.5)
ALT: 35 U/L — ABNORMAL HIGH (ref 6–29)
AST: 34 U/L (ref 10–35)
Albumin: 4.1 g/dL (ref 3.6–5.1)
Alkaline phosphatase (APISO): 41 U/L (ref 33–130)
BUN / CREAT RATIO: 28 (calc) — AB (ref 6–22)
BUN: 37 mg/dL — ABNORMAL HIGH (ref 7–25)
CHLORIDE: 105 mmol/L (ref 98–110)
CO2: 32 mmol/L (ref 20–32)
Calcium: 9.4 mg/dL (ref 8.6–10.4)
Creat: 1.34 mg/dL — ABNORMAL HIGH (ref 0.60–0.88)
GFR, EST AFRICAN AMERICAN: 42 mL/min/{1.73_m2} — AB (ref >=60)
GFR, Est Non African American: 37 mL/min/{1.73_m2} — ABNORMAL LOW (ref >=60)
GLOBULIN: 3.5 g/dL (ref 1.9–3.7)
Glucose, Bld: 74 mg/dL (ref 65–139)
Potassium: 4.1 mmol/L (ref 3.5–5.3)
SODIUM: 143 mmol/L (ref 135–146)
TOTAL PROTEIN: 7.6 g/dL (ref 6.1–8.1)
Total Bilirubin: 0.4 mg/dL (ref 0.2–1.2)

## 2017-05-24 LAB — URINE CULTURE
MICRO NUMBER:: 90188659
RESULT: NO GROWTH
SPECIMEN QUALITY: ADEQUATE

## 2017-05-24 LAB — TSH: TSH: 2.78 mIU/L (ref 0.40–4.50)

## 2017-05-27 ENCOUNTER — Other Ambulatory Visit: Payer: Self-pay | Admitting: Internal Medicine

## 2017-05-28 ENCOUNTER — Encounter: Payer: Self-pay | Admitting: Internal Medicine

## 2017-06-05 ENCOUNTER — Other Ambulatory Visit: Payer: Self-pay | Admitting: *Deleted

## 2017-06-05 MED ORDER — HYDROCHLOROTHIAZIDE 25 MG PO TABS: 25.0000 mg | ORAL_TABLET | Freq: Every day | ORAL | 0 refills | Status: DC

## 2017-06-05 NOTE — Telephone Encounter (Signed)
[facility] 

## 2017-06-26 ENCOUNTER — Other Ambulatory Visit: Payer: Self-pay | Admitting: *Deleted

## 2017-06-26 MED ORDER — HYDROCHLOROTHIAZIDE 25 MG PO TABS: 25.0000 mg | ORAL_TABLET | Freq: Every day | ORAL | 0 refills | Status: DC

## 2017-06-26 NOTE — Telephone Encounter (Signed)
Received fax from [facility] stating that patient transferred her Rx's to [facility] and they need Rx for HCTZ to keep on file. Rx sent.

## 2017-07-05 ENCOUNTER — Other Ambulatory Visit: Payer: Self-pay | Admitting: Internal Medicine

## 2017-07-05 DIAGNOSIS — I10 Essential (primary) hypertension: Secondary | ICD-10-CM

## 2017-07-12 ENCOUNTER — Ambulatory Visit (INDEPENDENT_AMBULATORY_CARE_PROVIDER_SITE_OTHER): Payer: Medicare Other

## 2017-07-12 ENCOUNTER — Ambulatory Visit (INDEPENDENT_AMBULATORY_CARE_PROVIDER_SITE_OTHER): Payer: Medicare Other | Admitting: Internal Medicine

## 2017-07-12 ENCOUNTER — Encounter: Payer: Self-pay | Admitting: Internal Medicine

## 2017-07-12 VITALS — BP 132/78 | HR 83 | Temp 97.8°F | Ht 65.0 in | Wt 122.0 lb

## 2017-07-12 DIAGNOSIS — G309 Alzheimer's disease, unspecified: Secondary | ICD-10-CM | POA: Diagnosis not present

## 2017-07-12 DIAGNOSIS — R634 Abnormal weight loss: Secondary | ICD-10-CM | POA: Diagnosis not present

## 2017-07-12 DIAGNOSIS — N289 Disorder of kidney and ureter, unspecified: Secondary | ICD-10-CM | POA: Diagnosis not present

## 2017-07-12 DIAGNOSIS — F0390 Unspecified dementia without behavioral disturbance: Secondary | ICD-10-CM

## 2017-07-12 DIAGNOSIS — Z0000 Encounter for general adult medical examination without abnormal findings: Secondary | ICD-10-CM

## 2017-07-12 MED ORDER — ZOSTER VAC RECOMB ADJUVANTED 50 MCG/0.5ML IM SUSR: 0.5000 mL | Freq: Once | INTRAMUSCULAR | 1 refills | Status: AC

## 2017-07-12 NOTE — Progress Notes (Signed)
Patient ID: [name] [name], female   DOB: 1932/02/26, 84 y.o.   MRN: [id number]   Location:  [facility] OFFICE  Provider: DR Arletha Grippe  Code Status: Goals of Care:  Advanced Directives 07/12/2017  Does Patient Have a Medical Advance Directive? Yes  Type of Advance Directive [place]  Does patient want to make changes to medical advance directive? No - Patient declined  Copy of [place] in Chart? Yes  Would patient like information on creating a medical advance directive? -     Chief Complaint  Patient presents with  . Medical Management of Chronic Issues    1 month follow-up on dementia. AWV completed today, MMSE 18/30, failed clock drawing   . Medication Refill    No refills needed     HPI: Patient is a 84 y.o. female seen today for f/u dementia. MMSE today 18/30 (prev 22/30 in July 2018). She admits to frequent episodes of memory/cognition worsening. She has tried aricept and namenda in the past but suffered severe HA; exelon patch caused dizziness. She was getting help during the day from [facility] up until her move into [facility] last month. A nurse is managing her medication at [facility]. Cr 1.34 and she reports she does not drink a lot of water as she does not remember to do so. Since moving into [facility], she has gained 4 lbs.   She has not seen a neurologist or had brain imaging.  She is a poor historian due to dementia. Hx obtained from chart and pt's niece who is present today  Past Medical History:  Diagnosis Date  . Arthritis    Osteoarthritis  . Chicken pox    as a child  . Hypertension   . Mumps    as a child  . MVP (mitral valve prolapse)     Past Surgical History:  Procedure Laterality Date  . ABDOMINAL HYSTERECTOMY    . CATARACT EXTRACTION Right 11 2013  . EYE SURGERY    . 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 TONSILLECTOMY AND ADENOIDECTOMY       reports that she has never smoked. She has never  used smokeless tobacco. She reports that she does not drink alcohol or use drugs. Social History   Socioeconomic History  . Marital status: Widowed    Spouse name: Not on file  . Number of children: Not on file  . Years of education: Not on file  . Highest education level: Not on file  Occupational History  . Occupation: retired Art therapist   Social Needs  . Financial resource strain: Not hard at all  . Food insecurity:    Worry: Never true    Inability: Never true  . Transportation needs:    Medical: No    Non-medical: No  Tobacco Use  . Smoking status: Never Smoker  . Smokeless tobacco: Never Used  Substance and Sexual Activity  . Alcohol use: Never    Frequency: Never  . Drug use: No  . Sexual activity: Never  Lifestyle  . Physical activity:    Days per week: 2 days    Minutes per session: 30 min  . Stress: Only a little  Relationships  . Social connections:    Talks on phone: More than three times a week    Gets together: More than three times a week    Attends religious service: Never    Active member of  club or organization: Yes    Attends meetings of clubs or organizations: More than 4 times per year    Relationship status: Widowed  . Intimate partner violence:    Fear of current or ex partner: No    Emotionally abused: No    Physically abused: No    Forced sexual activity: No  Other Topics Concern  . Not on file  Social History Narrative   Retired since 2004. [employer] in [place] masters degree widowed.  re located to [place]         Diet:   Do you drink/eat things with caffeine? Sometimes   Marital status: Widow                          What year were you married?   Do you live in a house, apartment, assisted living, condo, trailer, etc)? Condo   Is it one or more stories? Yes   How many persons live in your home? 1   Do you have any pets in your home? 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 Yes                                                    Type & how often: walking daily   Do you have a living will?  No   Do you have a DNR Form? No   Do you have a POA/HPOA forms? Yes       Family History  Problem Relation Age of Onset  . Alzheimer's disease Mother   . Cancer Father   . Diabetes Sister   . Hypertension Sister   . Hypertension Brother   . Diabetes Brother     Allergies  Allergen Reactions  . Exelon [Rivastigmine Tartrate] Other (See Comments)    Dizziness, stomach aches, confusion and paranoid     Outpatient Encounter Medications as of 07/12/2017  Medication Sig  . aspirin EC 81 MG tablet Take 81 mg by mouth daily.  . brimonidine (ALPHAGAN P) 0.1 % SOLN Place 1 drop 2 (two) times daily into both eyes.  . hydrochlorothiazide (HYDRODIURIL) 25 MG tablet Take 1 tablet (25 mg total) by mouth daily.  Marland Kitchen lisinopril (PRINIVIL,ZESTRIL) 10 MG tablet TAKE 1 TABLET BY MOUTH EVERY DAY TO CONTROL BLOOD PRESSURE  . potassium chloride SA (KLOR-CON M20) 20 MEQ tablet Take 1 tablet (20 mEq total) by mouth daily.  Marland Kitchen PREVIDENT 5000 ENAMEL PROTECT 1.1-5 % PSTE See admin instructions. Use daily as directed  . [DISCONTINUED] Zoster Vaccine Adjuvanted [facility]) injection Inject 0.5 mLs into the muscle once.  . [DISCONTINUED] hydrochlorothiazide (HYDRODIURIL) 25 MG tablet Take 1 tablet (25 mg total) by mouth daily.  . [DISCONTINUED] lisinopril (PRINIVIL,ZESTRIL) 10 MG tablet Take one tablet by mouth once daily to control blood pressure   No facility-administered encounter medications on file as of 07/12/2017.     Review of Systems:  Review of Systems  Unable to perform ROS: Dementia    Health Maintenance  Topic Date Due  . INFLUENZA VACCINE  11/09/2017  . TETANUS/TDAP  08/23/2020  . DEXA SCAN  Completed  . PNA vac Low Risk Adult  Completed    Physical Exam: Vitals:  07/12/17 1128  BP: 132/78  Pulse: 83  Temp: 97.8 F (36.6 C)  TempSrc: Oral    SpO2: 98%  Weight: 122 lb (55.3 kg)  Height: 5\' 5"  (1.651 m)   Body mass index is 20.3 kg/m. Physical Exam  Constitutional: She appears well-developed.  Thin appearing in NAD  HENT:  MM dry; no oral thrush  Cardiovascular: Normal rate, regular rhythm and intact distal pulses. Exam reveals no gallop and no friction rub.  Murmur (1/6 SEM) heard. No LE edema b/l. No calf TTP  Pulmonary/Chest: Effort normal and breath sounds normal. No respiratory distress. She has no wheezes. She has no rales. She exhibits no tenderness.  Musculoskeletal: She exhibits edema.  Neurological: She is alert.  Skin: Skin is warm and dry. No rash noted.  No tenting  Psychiatric: She has a normal mood and affect. Her behavior is normal. Thought content normal.    Labs reviewed: Basic Metabolic Panel: Recent Labs    11/08/16 1625 05/23/17 1525  NA 143 143  K 4.3 4.1  CL 102 105  CO2 31 32  GLUCOSE 62* 74  BUN 35* 37*  CREATININE 1.16* 1.34*  CALCIUM 9.1 9.4  TSH 2.66 2.78   Liver Function Tests: Recent Labs    11/08/16 1625 05/23/17 1525  AST 42* 34  ALT 38* 35*  ALKPHOS 45  --   BILITOT 0.4 0.4  PROT 7.3 7.6  ALBUMIN 4.0  --    No results for input(s): LIPASE, AMYLASE in the last 8760 hours. No results for input(s): AMMONIA in the last 8760 hours. CBC: Recent Labs    11/08/16 1625 05/23/17 1525  WBC 2.1* 2.5*  NEUTROABS 840* 1,320*  HGB 13.3 11.9  HCT 41.2 37.1  MCV 92.0 89.8  PLT 179 167   Lipid Panel: Recent Labs    11/08/16 1625  CHOL 198  HDL 73  LDLCALC 117*  TRIG 40  CHOLHDL 2.7   No results found for: HGBA1C  Procedures since last visit: No results found.  Assessment/Plan   ICD-10-CM   1. Alzheimer's disease, unspecified (CODE) G30.9 CT Head Wo Contrast  2. Dementia without behavioral disturbance, unspecified dementia type F03.90   3. Loss of weight R63.4    improved  4. Renal insufficiency - likely due to volume loss N28.9    Recommend you increase  your water intake - drink 3-4 glasses (6-8 oz) of water daily  Continue Nurse medication management program  Will call with head CT appt  Follow up on May 10th for dementia or sooner if need be. Will need to repeat BMP next visit  Yarieliz Wasser S. Perlie Gold  [facility] and Adult Medicine [address] [place], [place] [address] [phone] Cell (Monday-Friday 8 AM - 5 PM) [phone] After 5 PM and follow prompts

## 2017-07-12 NOTE — Patient Instructions (Signed)
[name] [name] , Thank you for taking time to come for your Medicare Wellness Visit. I appreciate your ongoing commitment to your health goals. Please review the following plan we discussed and let me know if I can assist you in the future.   Screening recommendations/referrals: Colonoscopy excluded, you are over age 82 Mammogram excluded, you are over age 29 Bone Density up to date Recommended yearly ophthalmology/optometry visit for glaucoma screening and checkup Recommended yearly dental visit for hygiene and checkup  Vaccinations: Influenza vaccine up to date, due 2019 fall season Pneumococcal vaccine up to date, completed Tdap vaccine up to date, due 08/23/2020 Shingles vaccine due, prescription sent to pharmacy    Advanced directives: in chart  Conditions/risks identified: none  Next appointment: Tyson Dense, RN 07/23/2018 @ 10:45am   Preventive Care 65 Years and Older, Female Preventive care refers to lifestyle choices and visits with your health care provider that can promote health and wellness. What does preventive care include?  A yearly physical exam. This is also called an annual well check.  Dental exams once or twice a year.  Routine eye exams. Ask your health care provider how often you should have your eyes checked.  Personal lifestyle choices, including:  Daily care of your teeth and gums.  Regular physical activity.  Eating a healthy diet.  Avoiding tobacco and drug use.  Limiting alcohol use.  Practicing safe sex.  Taking low-dose aspirin every day.  Taking vitamin and mineral supplements as recommended by your health care provider. What happens during an annual well check? The services and screenings done by your health care provider during your annual well check will depend on your age, overall health, lifestyle risk factors, and family history of disease. Counseling  Your health care provider may ask you questions about your:  Alcohol  use.  Tobacco use.  Drug use.  Emotional well-being.  Home and relationship well-being.  Sexual activity.  Eating habits.  History of falls.  Memory and ability to understand (cognition).  Work and work Statistician.  Reproductive health. Screening  You may have the following tests or measurements:  Height, weight, and BMI.  Blood pressure.  Lipid and cholesterol levels. These may be checked every 5 years, or more frequently if you are over 2 years old.  Skin check.  Lung cancer screening. You may have this screening every year starting at age 69 if you have a 30-pack-year history of smoking and currently smoke or have quit within the past 15 years.  Fecal occult blood test (FOBT) of the stool. You may have this test every year starting at age 64.  Flexible sigmoidoscopy or colonoscopy. You may have a sigmoidoscopy every 5 years or a colonoscopy every 10 years starting at age 2.  Hepatitis C blood test.  Hepatitis B blood test.  Sexually transmitted disease (STD) testing.  Diabetes screening. This is done by checking your blood sugar (glucose) after you have not eaten for a while (fasting). You may have this done every 1-3 years.  Bone density scan. This is done to screen for osteoporosis. You may have this done starting at age 7.  Mammogram. This may be done every 1-2 years. Talk to your health care provider about how often you should have regular mammograms. Talk with your health care provider about your test results, treatment options, and if necessary, the need for more tests. Vaccines  Your health care provider may recommend certain vaccines, such as:  Influenza vaccine. This is recommended every year.  Tetanus, diphtheria, and acellular pertussis (Tdap, Td) vaccine. You may need a Td booster every 10 years.  Zoster vaccine. You may need this after age 11.  Pneumococcal 13-valent conjugate (PCV13) vaccine. One dose is recommended after age  6.  Pneumococcal polysaccharide (PPSV23) vaccine. One dose is recommended after age 69. Talk to your health care provider about which screenings and vaccines you need and how often you need them. This information is not intended to replace advice given to you by your health care provider. Make sure you discuss any questions you have with your health care provider. Document Released: 04/24/2015 Document Revised: 12/16/2015 Document Reviewed: 01/27/2015 Elsevier Interactive Patient Education  2017 [place] Prevention in the Home Falls can cause injuries. They can happen to people of all ages. There are many things you can do to make your home safe and to help prevent falls. What can I do on the outside of my home?  Regularly fix the edges of walkways and driveways and fix any cracks.  Remove anything that might make you trip as you walk through a door, such as a raised step or threshold.  Trim any bushes or trees on the path to your home.  Use bright outdoor lighting.  Clear any walking paths of anything that might make someone trip, such as rocks or tools.  Regularly check to see if handrails are loose or broken. Make sure that both sides of any steps have handrails.  Any raised decks and porches should have guardrails on the edges.  Have any leaves, snow, or ice cleared regularly.  Use sand or salt on walking paths during winter.  Clean up any spills in your garage right away. This includes oil or grease spills. What can I do in the bathroom?  Use night lights.  Install grab bars by the toilet and in the tub and shower. Do not use towel bars as grab bars.  Use non-skid mats or decals in the tub or shower.  If you need to sit down in the shower, use a plastic, non-slip stool.  Keep the floor dry. Clean up any water that spills on the floor as soon as it happens.  Remove soap buildup in the tub or shower regularly.  Attach bath mats securely with double-sided  non-slip rug tape.  Do not have throw rugs and other things on the floor that can make you trip. What can I do in the bedroom?  Use night lights.  Make sure that you have a light by your bed that is easy to reach.  Do not use any sheets or blankets that are too big for your bed. They should not hang down onto the floor.  Have a firm chair that has side arms. You can use this for support while you get dressed.  Do not have throw rugs and other things on the floor that can make you trip. What can I do in the kitchen?  Clean up any spills right away.  Avoid walking on wet floors.  Keep items that you use a lot in easy-to-reach places.  If you need to reach something above you, use a strong step stool that has a grab bar.  Keep electrical cords out of the way.  Do not use floor polish or wax that makes floors slippery. If you must use wax, use non-skid floor wax.  Do not have throw rugs and other things on the floor that can make you trip. What can I do  with my stairs?  Do not leave any items on the stairs.  Make sure that there are handrails on both sides of the stairs and use them. Fix handrails that are broken or loose. Make sure that handrails are as long as the stairways.  Check any carpeting to make sure that it is firmly attached to the stairs. Fix any carpet that is loose or worn.  Avoid having throw rugs at the top or bottom of the stairs. If you do have throw rugs, attach them to the floor with carpet tape.  Make sure that you have a light switch at the top of the stairs and the bottom of the stairs. If you do not have them, ask someone to add them for you. What else can I do to help prevent falls?  Wear shoes that:  Do not have high heels.  Have rubber bottoms.  Are comfortable and fit you well.  Are closed at the toe. Do not wear sandals.  If you use a stepladder:  Make sure that it is fully opened. Do not climb a closed stepladder.  Make sure that both  sides of the stepladder are locked into place.  Ask someone to hold it for you, if possible.  Clearly mark and make sure that you can see:  Any grab bars or handrails.  First and last steps.  Where the edge of each step is.  Use tools that help you move around (mobility aids) if they are needed. These include:  Canes.  Walkers.  Scooters.  Crutches.  Turn on the lights when you go into a dark area. Replace any light bulbs as soon as they burn out.  Set up your furniture so you have a clear path. Avoid moving your furniture around.  If any of your floors are uneven, fix them.  If there are any pets around you, be aware of where they are.  Review your medicines with your doctor. Some medicines can make you feel dizzy. This can increase your chance of falling. Ask your doctor what other things that you can do to help prevent falls. This information is not intended to replace advice given to you by your health care provider. Make sure you discuss any questions you have with your health care provider. Document Released: 01/22/2009 Document Revised: 09/03/2015 Document Reviewed: 05/02/2014 Elsevier Interactive Patient Education  2017 [employer].

## 2017-07-12 NOTE — Patient Instructions (Addendum)
Recommend you increase your water intake - drink 3-4 glasses (6-8 oz) of water daily  Continue Nurse medication management program  Will call with head CT appt  Follow up on May 10th for dementia or sooner if need be.

## 2017-07-12 NOTE — Progress Notes (Signed)
Subjective:   [name] [name] is a 84 y.o. female who presents for Medicare Annual (Subsequent) preventive examination.  Last AWV-11/08/2016    Objective:     Vitals: BP 132/78 (BP Location: Left Arm, Patient Position: Sitting)   Pulse 83   Temp 97.8 F (36.6 C) (Oral)   Ht 5\' 5"  (1.651 m)   Wt 122 lb (55.3 kg)   SpO2 98%   BMI 20.30 kg/m   Body mass index is 20.3 kg/m.  Advanced Directives 07/12/2017 05/23/2017 12/13/2016 11/08/2016 05/25/2016 10/07/2015 06/12/2015  Does Patient Have a Medical Advance Directive? Yes Yes Yes Yes No Yes Yes  Type of Arts administrator Power of [place]  Does patient want to make changes to medical advance directive? No - Patient declined - - - - No - Patient declined No - Patient declined  Copy of [place] in Chart? Yes Yes Yes Yes - Yes Yes  Would patient like information on creating a medical advance directive? - - - - No - Patient declined - -    Tobacco Social History   Tobacco Use  Smoking Status Never Smoker  Smokeless Tobacco Never Used     Counseling given: Not Answered   Clinical Intake:  Pre-visit preparation completed: No  Pain : No/denies pain     Nutritional Risks: None Diabetes: No  How often do you need to have someone help you when you read instructions, pamphlets, or other written materials from your doctor or pharmacy?: 1 - Never What is the last grade level you completed in school?: College  Interpreter Needed?: No  Information entered by :: Tyson Dense, RN  Past Medical History:  Diagnosis Date  . Arthritis    Osteoarthritis  . Chicken pox    as a child  . Hypertension   . Mumps    as a child  . MVP (mitral valve prolapse)    Past Surgical History:  Procedure Laterality Date  . ABDOMINAL HYSTERECTOMY    . CATARACT EXTRACTION Right  11 2013  . EYE SURGERY    . 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 TONSILLECTOMY AND ADENOIDECTOMY     Family History  Problem Relation Age of Onset  . Alzheimer's disease Mother   . Cancer Father   . Diabetes Sister   . Hypertension Sister   . Hypertension Brother   . Diabetes Brother    Social History   Socioeconomic History  . Marital status: Widowed    Spouse name: Not on file  . Number of children: Not on file  . Years of education: Not on file  . Highest education level: Not on file  Occupational History  . Occupation: retired Art therapist   Social Needs  . Financial resource strain: Not hard at all  . Food insecurity:    Worry: Never true    Inability: Never true  . Transportation needs:    Medical: No    Non-medical: No  Tobacco Use  . Smoking status: Never Smoker  . Smokeless tobacco: Never Used  Substance and Sexual Activity  . Alcohol use: Never    Frequency: Never  . Drug use: No  . Sexual activity: Never  Lifestyle  . Physical activity:    Days per week: 2 days    Minutes per session: 30 min  . Stress: Only a  little  Relationships  . Social connections:    Talks on phone: More than three times a week    Gets together: More than three times a week    Attends religious service: Never    Active member of club or organization: Yes    Attends meetings of clubs or organizations: More than 4 times per year    Relationship status: Widowed  Other Topics Concern  . Not on file  Social History Narrative   Retired since 2004. Thought middle school in [place] masters degree widowed.  re located to [place]         Diet:   Do you drink/eat things with caffeine? Sometimes   Marital status: Widow                          What year were you married?   Do you live in a house, apartment, assisted living, condo, trailer, etc)? Condo   Is it one or more stories? Yes   How many persons live in your home? 1   Do you have any pets in your  home? 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 Yes                                                    Type & how often: walking daily   Do you have a living will?  No   Do you have a DNR Form? No   Do you have a POA/HPOA forms? Yes       Outpatient Encounter Medications as of 07/12/2017  Medication Sig  . aspirin EC 81 MG tablet Take 81 mg by mouth daily.  . brimonidine (ALPHAGAN P) 0.1 % SOLN Place 1 drop 2 (two) times daily into both eyes.  . hydrochlorothiazide (HYDRODIURIL) 25 MG tablet Take 1 tablet (25 mg total) by mouth daily.  Marland Kitchen lisinopril (PRINIVIL,ZESTRIL) 10 MG tablet TAKE 1 TABLET BY MOUTH EVERY DAY TO CONTROL BLOOD PRESSURE  . potassium chloride SA (KLOR-CON M20) 20 MEQ tablet Take 1 tablet (20 mEq total) by mouth daily.  Marland Kitchen PREVIDENT 5000 ENAMEL PROTECT 1.1-5 % PSTE See admin instructions. Use daily as directed  . Zoster Vaccine Adjuvanted [facility]) injection Inject 0.5 mLs into the muscle once for 1 dose.  . [DISCONTINUED] Zoster Vaccine Adjuvanted [facility]) injection Inject 0.5 mLs into the muscle once.  . [DISCONTINUED] hydrochlorothiazide (HYDRODIURIL) 25 MG tablet Take 1 tablet (25 mg total) by mouth daily.  . [DISCONTINUED] lisinopril (PRINIVIL,ZESTRIL) 10 MG tablet Take one tablet by mouth once daily to control blood pressure   No facility-administered encounter medications on file as of 07/12/2017.     Activities of Daily Living In your present state of health, do you have any difficulty performing the following activities: 07/12/2017 11/08/2016  Hearing? Y N  Vision? Y N  Difficulty concentrating or making decisions? Tempie Donning  Walking or climbing stairs? N N  Dressing or bathing? N N  Doing errands, shopping? Y N  Preparing Food and eating ? N -  Using the Toilet? N -  In the past six months, have you accidently leaked urine? N -  Do you have problems with loss of bowel control? N -  Managing  your Medications? Y -    Managing your Finances? Y -  Housekeeping or managing your Housekeeping? Y -  Some recent data might be hidden    Patient Care Team: Gildardo Cranker, DO as PCP - General (Internal Medicine)    Assessment:   This is a routine wellness examination for [name].  Exercise Activities and Dietary recommendations Current Exercise Habits: Structured exercise class, Type of exercise: Other - see comments([employer] classes), Time (Minutes): 30, Frequency (Times/Week): 2, Weekly Exercise (Minutes/Week): 60, Intensity: Mild, Exercise limited by: None identified  Goals    None      Fall Risk Fall Risk  07/12/2017 12/13/2016 11/08/2016 05/25/2016 03/30/2016  Falls in the past year? No No No No No   Is the patient's home free of loose throw rugs in walkways, pet beds, electrical cords, etc?   yes      Grab bars in the bathroom? yes      Handrails on the stairs?   yes      Adequate lighting?   yes  Timed Get Up and Go performed: 18 seconds, fall risk  Depression Screen PHQ 2/9 Scores 07/12/2017 11/08/2016 05/25/2016 03/27/2015  PHQ - 2 Score 0 0 0 0     Cognitive Function MMSE - Mini Mental State Exam 07/12/2017 11/08/2016 05/13/2015  Not completed: - - (No Data)  Orientation to time 1 2 4   Orientation to Place 0 4 3  Registration 3 3 3   Attention/ Calculation 5 5 5   Recall 0 0 0  Language- name 2 objects 2 2 2   Language- repeat 1 1 1   Language- follow 3 step command 3 2 3   Language- read & follow direction 1 1 1   Write a sentence 1 1 1   Copy design 1 1 0  Total score 18 22 23         Immunization History  Administered Date(s) Administered  . Influenza,inj,Quad PF,6+ Mos 02/03/2014, 03/27/2015, 03/30/2016, 12/13/2016  . Pneumococcal Conjugate-13 02/15/2017  . Pneumococcal Polysaccharide-23 12/03/2012  . Tdap 08/24/2010    Qualifies for Shingles Vaccine? Yes, educated and prescription sent to pharmacy  Screening Tests Health Maintenance  Topic Date Due  . INFLUENZA VACCINE   11/09/2017  . TETANUS/TDAP  08/23/2020  . DEXA SCAN  Completed  . PNA vac Low Risk Adult  Completed    Cancer Screenings: Lung: Low Dose CT Chest recommended if Age 68-80 years, 30 pack-year currently smoking OR have quit w/in 15years. Patient does not qualify. Breast:  Up to date on Mammogram? Yes   Up to date of Bone Density/Dexa? Yes Colorectal: up to date  Additional Screenings: : Hepatitis C Screening: declined     Plan:    I have personally reviewed and addressed the Medicare Annual Wellness questionnaire and have noted the following in the patient's chart:  A. Medical and social history B. Use of alcohol, tobacco or illicit drugs  C. Current medications and supplements D. Functional ability and status E.  Nutritional status F.  Physical activity G. Advance directives H. List of other physicians I.  Hospitalizations, surgeries, and ER visits in previous 12 months J.  [place] to include hearing, vision, cognitive, depression L. Referrals and appointments - none  In addition, I have reviewed and discussed with patient certain preventive protocols, quality metrics, and best practice recommendations. A written personalized care plan for preventive services as well as general preventive health recommendations were provided to patient.  See attached scanned questionnaire for additional information.  Signed,   Tyson Dense, RN Nurse Health Advisor  Patient Concerns: Progression of memory loss

## 2017-07-13 ENCOUNTER — Ambulatory Visit: Payer: Medicare Other

## 2017-08-16 DIAGNOSIS — H401232 Low-tension glaucoma, bilateral, moderate stage: Secondary | ICD-10-CM | POA: Diagnosis not present

## 2017-08-17 ENCOUNTER — Other Ambulatory Visit: Payer: Medicare Other

## 2017-08-17 ENCOUNTER — Ambulatory Visit
Admission: RE | Admit: 2017-08-17 | Discharge: 2017-08-17 | Disposition: A | Discharge status: Home / Self Care | Payer: Medicare Other | Source: Ambulatory Visit | Attending: Internal Medicine | Admitting: Internal Medicine

## 2017-08-17 DIAGNOSIS — G309 Alzheimer's disease, unspecified: Secondary | ICD-10-CM

## 2017-08-17 DIAGNOSIS — F0390 Unspecified dementia without behavioral disturbance: Secondary | ICD-10-CM | POA: Diagnosis not present

## 2017-08-18 ENCOUNTER — Encounter: Payer: Self-pay | Admitting: Internal Medicine

## 2017-08-18 ENCOUNTER — Ambulatory Visit: Payer: Medicare Other | Admitting: Internal Medicine

## 2017-08-18 ENCOUNTER — Ambulatory Visit (INDEPENDENT_AMBULATORY_CARE_PROVIDER_SITE_OTHER): Payer: Medicare Other | Admitting: Internal Medicine

## 2017-08-18 VITALS — BP 128/78 | HR 74 | Temp 97.4°F | Ht 65.0 in | Wt 125.0 lb

## 2017-08-18 DIAGNOSIS — F0390 Unspecified dementia without behavioral disturbance: Secondary | ICD-10-CM

## 2017-08-18 DIAGNOSIS — Z79899 Other long term (current) drug therapy: Secondary | ICD-10-CM | POA: Diagnosis not present

## 2017-08-18 DIAGNOSIS — I10 Essential (primary) hypertension: Secondary | ICD-10-CM | POA: Diagnosis not present

## 2017-08-18 LAB — BASIC METABOLIC PANEL WITH GFR
BUN/Creatinine Ratio: 24 (calc) — ABNORMAL HIGH (ref 6–22)
BUN: 31 mg/dL — ABNORMAL HIGH (ref 7–25)
CALCIUM: 9.4 mg/dL (ref 8.6–10.4)
CO2: 33 mmol/L — ABNORMAL HIGH (ref 20–32)
CREATININE: 1.27 mg/dL — AB (ref 0.60–0.88)
Chloride: 105 mmol/L (ref 98–110)
GFR, EST NON AFRICAN AMERICAN: 39 mL/min/{1.73_m2} — AB (ref >=60)
GFR, Est African American: 45 mL/min/{1.73_m2} — ABNORMAL LOW (ref >=60)
GLUCOSE: 75 mg/dL (ref 65–99)
Potassium: 4.1 mmol/L (ref 3.5–5.3)
SODIUM: 146 mmol/L (ref 135–146)

## 2017-08-18 NOTE — Patient Instructions (Addendum)
Continue current medications as ordered  Push water intake to prevent dehydration  Will call with lab results  Follow up in 2 mos for dementia, HTN

## 2017-08-18 NOTE — Progress Notes (Signed)
Patient ID: [name] [name], female   DOB: 12/22/1931, 84 y.o.   MRN: [id number]   Location:  [facility] OFFICE  Provider: DR Arletha Grippe  Code Status:  Goals of Care:  Advanced Directives 07/12/2017  Does Patient Have a Medical Advance Directive? Yes  Type of Advance Directive [place]  Does patient want to make changes to medical advance directive? No - Patient declined  Copy of [place] in Chart? Yes  Would patient like information on creating a medical advance directive? -     Chief Complaint  Patient presents with  . Medical Management of Chronic Issues    Follow-up on dementia, here with Nurse([name]) working through [facility]. Daughter is in transit to [place]   . Medication Management    Medication is administer by CNA at [facility], no list was provided by facility. Nurse Kalman Shan called daughter to verify medications    HPI: Patient is a 84 y.o. female seen today for dementia. She had CT head that showed mild atrophic and ischemic changes without acute changes. Her niece, [name] [name], had to leave yesterday and therefore her [facility] Nurse, Kalman Shan, is present. She is c/a her memory worsening - she forgets to take her medications, dates, appts.   Dementia - progressing. MMSE 18/30 (prev 22/30 in July 2018). She has tried aricept and namenda in the past but suffered severe HA; exelon patch caused dizziness. She lives at [facility] now and a nurse is managing her medication. Cr 1.34. She has been trying to remember to drink more water. Weigh up 7 lbs since Feb 2019. CT head wo contrast revealed no acute process but did show small vessel ischemic disease and mild atrophic changes.  She is a poor historian due to dementia. Hx obtained from chart and pt's niece who is present today   Past Medical History:  Diagnosis Date  . Arthritis    Osteoarthritis  . Chicken pox    as a child  . Hypertension   . Mumps    as a child  . MVP (mitral valve  prolapse)     Past Surgical History:  Procedure Laterality Date  . ABDOMINAL HYSTERECTOMY    . CATARACT EXTRACTION Right 11 2013  . EYE SURGERY    . 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 TONSILLECTOMY AND ADENOIDECTOMY       reports that she has never smoked. She has never used smokeless tobacco. She reports that she does not drink alcohol or use drugs. Social History   Socioeconomic History  . Marital status: Widowed    Spouse name: Not on file  . Number of children: Not on file  . Years of education: Not on file  . Highest education level: Not on file  Occupational History  . Occupation: retired Art therapist   Social Needs  . Financial resource strain: Not hard at all  . Food insecurity:    Worry: Never true    Inability: Never true  . Transportation needs:    Medical: No    Non-medical: No  Tobacco Use  . Smoking status: Never Smoker  . Smokeless tobacco: Never Used  Substance and Sexual Activity  . Alcohol use: Never    Frequency: Never  . Drug use: No  . Sexual activity: Never  Lifestyle  . Physical activity:    Days per week: 2 days    Minutes per session: 30 min  . Stress: Only a little  Relationships  . Social connections:    Talks on phone: More than three times a week    Gets together: More than three times a week    Attends religious service: Never    Active member of club or organization: Yes    Attends meetings of clubs or organizations: More than 4 times per year    Relationship status: Widowed  . Intimate partner violence:    Fear of current or ex partner: No    Emotionally abused: No    Physically abused: No    Forced sexual activity: No  Other Topics Concern  . Not on file  Social History Narrative   Retired since 2004. [employer] in [place] masters degree widowed.  re located to [place]         Diet:   Do you drink/eat things with caffeine? Sometimes   Marital status: Widow                           What year were you married?   Do you live in a house, apartment, assisted living, condo, trailer, etc)? Condo   Is it one or more stories? Yes   How many persons live in your home? 1   Do you have any pets in your home? 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 Yes                                                    Type & how often: walking daily   Do you have a living will?  No   Do you have a DNR Form? No   Do you have a POA/HPOA forms? Yes       Family History  Problem Relation Age of Onset  . Alzheimer's disease Mother   . Cancer Father   . Diabetes Sister   . Hypertension Sister   . Hypertension Brother   . Diabetes Brother     Allergies  Allergen Reactions  . Exelon [Rivastigmine Tartrate] Other (See Comments)    Dizziness, stomach aches, confusion and paranoid     Outpatient Encounter Medications as of 08/18/2017  Medication Sig  . aspirin EC 81 MG tablet Take 81 mg by mouth daily.  . brimonidine (ALPHAGAN P) 0.1 % SOLN Place 1 drop 2 (two) times daily into both eyes.  . hydrochlorothiazide (HYDRODIURIL) 25 MG tablet Take 1 tablet (25 mg total) by mouth daily.  Marland Kitchen lisinopril (PRINIVIL,ZESTRIL) 10 MG tablet TAKE 1 TABLET BY MOUTH EVERY DAY TO CONTROL BLOOD PRESSURE  . potassium chloride SA (KLOR-CON M20) 20 MEQ tablet Take 1 tablet (20 mEq total) by mouth daily.  Marland Kitchen PREVIDENT 5000 ENAMEL PROTECT 1.1-5 % PSTE See admin instructions. Use daily as directed   No facility-administered encounter medications on file as of 08/18/2017.     Review of Systems:  Review of Systems  Unable to perform ROS: Dementia    Health Maintenance  Topic Date Due  . INFLUENZA VACCINE  11/09/2017  . TETANUS/TDAP  08/23/2020  . DEXA SCAN  Completed  . PNA vac Low Risk Adult  Completed    Physical Exam: Vitals:   08/18/17 1133  BP: 128/78  Pulse: 74  Temp: Marland Kitchen)  97.4 F (36.3 C)  TempSrc: Oral  SpO2: 97%  Weight: 125 lb (56.7 kg)    Height: 5' 5"  (1.651 m)   Body mass index is 20.8 kg/m. Physical Exam  Constitutional: She appears well-developed and well-nourished.  Cardiovascular: Normal rate, regular rhythm and intact distal pulses. Exam reveals no gallop and no friction rub.  Murmur (1/6 SEM) heard. Trace LE edema b/l. No calf TTP  Pulmonary/Chest: Effort normal and breath sounds normal. No stridor. No respiratory distress. She has no wheezes. She has no rales. She exhibits no tenderness.  Skin: Skin is warm and dry.  Psychiatric: She has a normal mood and affect. Her behavior is normal.    Labs reviewed: Basic Metabolic Panel: Recent Labs    11/08/16 1625 05/23/17 1525  NA 143 143  K 4.3 4.1  CL 102 105  CO2 31 32  GLUCOSE 62* 74  BUN 35* 37*  CREATININE 1.16* 1.34*  CALCIUM 9.1 9.4  TSH 2.66 2.78   Liver Function Tests: Recent Labs    11/08/16 1625 05/23/17 1525  AST 42* 34  ALT 38* 35*  ALKPHOS 45  --   BILITOT 0.4 0.4  PROT 7.3 7.6  ALBUMIN 4.0  --    No results for input(s): LIPASE, AMYLASE in the last 8760 hours. No results for input(s): AMMONIA in the last 8760 hours. CBC: Recent Labs    11/08/16 1625 05/23/17 1525  WBC 2.1* 2.5*  NEUTROABS 840* 1,320*  HGB 13.3 11.9  HCT 41.2 37.1  MCV 92.0 89.8  PLT 179 167   Lipid Panel: Recent Labs    11/08/16 1625  CHOL 198  HDL 73  LDLCALC 117*  TRIG 40  CHOLHDL 2.7   No results found for: HGBA1C  Procedures since last visit: Ct Head Wo Contrast  Result Date: 08/17/2017 CLINICAL DATA:  Progressive dementia EXAM: CT HEAD WITHOUT CONTRAST TECHNIQUE: Contiguous axial images were obtained from the base of the skull through the vertex without intravenous contrast. COMPARISON:  None. FINDINGS: Brain: Mild atrophic changes and chronic white matter ischemic changes commensurate with the patient's given age are noted. No findings to suggest acute hemorrhage or acute infarction are seen. Vascular: No hyperdense vessel or unexpected  calcification. Skull: Normal. Negative for fracture or focal lesion. Sinuses/Orbits: No acute finding. Other: None. IMPRESSION: Mild atrophic and ischemic changes without acute abnormality. Electronically Signed   By: Inez Catalina M.D.   On: 08/17/2017 16:27    Assessment/Plan   ICD-10-CM   1. Dementia without behavioral disturbance, unspecified dementia type F03.90   2. Essential hypertension I10 BMP with eGFR([facility])  3. High risk medication use Z79.899 BMP with eGFR([facility])   Continue current medications as ordered  Push water intake to prevent dehydration  Will call with lab results  Follow up in 2 mos for dementia, HTN   Joella Saefong S. Perlie Gold  [facility] and Adult Medicine [address] [place], [place] [address] [phone] Cell (Monday-Friday 8 AM - 5 PM) [phone] After 5 PM and follow prompts

## 2017-08-21 ENCOUNTER — Encounter: Payer: Self-pay | Admitting: Internal Medicine

## 2017-09-20 ENCOUNTER — Encounter: Payer: Self-pay | Admitting: Internal Medicine

## 2017-09-20 ENCOUNTER — Ambulatory Visit (INDEPENDENT_AMBULATORY_CARE_PROVIDER_SITE_OTHER): Payer: Medicare Other | Admitting: Internal Medicine

## 2017-09-20 VITALS — BP 156/88 | HR 66 | Temp 97.6°F | Ht 65.0 in | Wt 129.4 lb

## 2017-09-20 DIAGNOSIS — F0390 Unspecified dementia without behavioral disturbance: Secondary | ICD-10-CM | POA: Diagnosis not present

## 2017-09-20 DIAGNOSIS — I10 Essential (primary) hypertension: Secondary | ICD-10-CM

## 2017-09-20 NOTE — Patient Instructions (Signed)
Continue current medications as ordered  ENJOY YOUR VACATION!!  Follow up in 2 mos for dementia and HTN

## 2017-09-20 NOTE — Progress Notes (Signed)
Patient ID: [name] [name], female   DOB: 06-22-1931, 84 y.o.   MRN: [id number]   Location:  [facility] OFFICE  Provider: DR Arletha Grippe  Code Status:  Goals of Care:  Advanced Directives 07/12/2017  Does Patient Have a Medical Advance Directive? Yes  Type of Advance Directive [place]  Does patient want to make changes to medical advance directive? No - Patient declined  Copy of [place] in Chart? Yes  Would patient like information on creating a medical advance directive? -     Chief Complaint  Patient presents with  . Follow-up    Follow up per family request. [name] the niece is here with the patient.     HPI: Patient is a 84 y.o. female seen today for medical management of chronic diseases.  Niece is planning to take pt on a cruise next week. They will travel to [place] tomorrow and visit [place] followed by train to [place] where they will board a ship to [place].  Pt is excited about the trip. Niece has door alarm for stateroom door and balcony door on ship and purchased walkie talkies also. Pt is a poor historian due to dementia. Hx obtained from chart.     Past Medical History:  Diagnosis Date  . Arthritis    Osteoarthritis  . Chicken pox    as a child  . Hypertension   . Mumps    as a child  . MVP (mitral valve prolapse)     Past Surgical History:  Procedure Laterality Date  . ABDOMINAL HYSTERECTOMY    . CATARACT EXTRACTION Right 11 2013  . EYE SURGERY    . 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 TONSILLECTOMY AND ADENOIDECTOMY       reports that she has never smoked. She has never used smokeless tobacco. She reports that she does not drink alcohol or use drugs. Social History   Socioeconomic History  . Marital status: Widowed    Spouse name: Not on file  . Number of children: Not on file  . Years of education: Not on file  . Highest education level: Not on file  Occupational History  . Occupation: retired Art therapist     Social Needs  . Financial resource strain: Not hard at all  . Food insecurity:    Worry: Never true    Inability: Never true  . Transportation needs:    Medical: No    Non-medical: No  Tobacco Use  . Smoking status: Never Smoker  . Smokeless tobacco: Never Used  Substance and Sexual Activity  . Alcohol use: Never    Frequency: Never  . Drug use: No  . Sexual activity: Never  Lifestyle  . Physical activity:    Days per week: 2 days    Minutes per session: 30 min  . Stress: Only a little  Relationships  . Social connections:    Talks on phone: More than three times a week    Gets together: More than three times a week    Attends religious service: Never    Active member of club or organization: Yes    Attends meetings of clubs or organizations: More than 4 times per year    Relationship status: Widowed  . Intimate partner violence:    Fear of current or ex partner: No    Emotionally abused: No    Physically abused: No    Forced sexual activity: No  Other Topics Concern  . Not on file  Social History Narrative   Retired since 2004. [employer] in [place] masters degree widowed.  re located to [place]         Diet:   Do you drink/eat things with caffeine? Sometimes   Marital status: Widow                          What year were you married?   Do you live in a house, apartment, assisted living, condo, trailer, etc)? Condo   Is it one or more stories? Yes   How many persons live in your home? 1   Do you have any pets in your home? 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 Yes                                                    Type & how often: walking daily   Do you have a living will?  No   Do you have a DNR Form? No   Do you have a POA/HPOA forms? Yes       Family History  Problem Relation Age of Onset  . Alzheimer's disease Mother   . Cancer Father   . Diabetes Sister   .  Hypertension Sister   . Hypertension Brother   . Diabetes Brother     Allergies  Allergen Reactions  . Exelon [Rivastigmine Tartrate] Other (See Comments)    Dizziness, stomach aches, confusion and paranoid     Outpatient Encounter Medications as of 09/20/2017  Medication Sig  . aspirin EC 81 MG tablet Take 81 mg by mouth daily.  . brimonidine (ALPHAGAN P) 0.1 % SOLN Place 1 drop 2 (two) times daily into both eyes.  . hydrochlorothiazide (HYDRODIURIL) 25 MG tablet Take 1 tablet (25 mg total) by mouth daily.  Marland Kitchen lisinopril (PRINIVIL,ZESTRIL) 10 MG tablet TAKE 1 TABLET BY MOUTH EVERY DAY TO CONTROL BLOOD PRESSURE  . potassium chloride SA (KLOR-CON M20) 20 MEQ tablet Take 1 tablet (20 mEq total) by mouth daily.  Marland Kitchen PREVIDENT 5000 ENAMEL PROTECT 1.1-5 % PSTE See admin instructions. Use daily as directed   No facility-administered encounter medications on file as of 09/20/2017.     Review of Systems:  Review of Systems  Unable to perform ROS: Dementia    Health Maintenance  Topic Date Due  . INFLUENZA VACCINE  11/09/2017  . TETANUS/TDAP  08/23/2020  . DEXA SCAN  Completed  . PNA vac Low Risk Adult  Completed    Physical Exam: Vitals:   09/20/17 0908  BP: (!) 156/88  Pulse: 66  Temp: 97.6 F (36.4 C)  SpO2: 93%  Weight: 129 lb 6.4 oz (58.7 kg)  Height: 5\' 5"  (1.651 m)   Body mass index is 21.53 kg/m. Physical Exam  Constitutional: She appears well-developed and well-nourished.  Cardiovascular: Normal rate, regular rhythm and intact distal pulses. Exam reveals no gallop and no friction rub.  Murmur (1/6 SEM) heard. +1 pitting LE edema b/l. No calf TTP  Pulmonary/Chest: Effort normal and breath sounds normal. No stridor. She has no wheezes. She has no rales. She exhibits no tenderness.  Abdominal: Soft. Bowel sounds are normal. She exhibits  no distension and no mass. There is no tenderness. There is no rebound and no guarding.  Musculoskeletal: She exhibits edema.    Neurological: She is alert.  Skin: Skin is warm and dry. No rash noted.  Psychiatric: She has a normal mood and affect. Her behavior is normal. Thought content normal.    Labs reviewed: Basic Metabolic Panel: Recent Labs    11/08/16 1625 05/23/17 1525 08/18/17 1218  NA 143 143 146  K 4.3 4.1 4.1  CL 102 105 105  CO2 31 32 33*  GLUCOSE 62* 74 75  BUN 35* 37* 31*  CREATININE 1.16* 1.34* 1.27*  CALCIUM 9.1 9.4 9.4  TSH 2.66 2.78  --    Liver Function Tests: Recent Labs    11/08/16 1625 05/23/17 1525  AST 42* 34  ALT 38* 35*  ALKPHOS 45  --   BILITOT 0.4 0.4  PROT 7.3 7.6  ALBUMIN 4.0  --    No results for input(s): LIPASE, AMYLASE in the last 8760 hours. No results for input(s): AMMONIA in the last 8760 hours. CBC: Recent Labs    11/08/16 1625 05/23/17 1525  WBC 2.1* 2.5*  NEUTROABS 840* 1,320*  HGB 13.3 11.9  HCT 41.2 37.1  MCV 92.0 89.8  PLT 179 167   Lipid Panel: Recent Labs    11/08/16 1625  CHOL 198  HDL 73  LDLCALC 117*  TRIG 40  CHOLHDL 2.7   No results found for: HGBA1C  Procedures since last visit: No results found.  Assessment/Plan   ICD-10-CM   1. Dementia without behavioral disturbance, unspecified dementia type F03.90   2. Essential hypertension I10     Continue current medications as ordered  ENJOY YOUR VACATION!!  Follow up in 2 mos for dementia and HTN   Makaylee Spielberg S. Perlie Gold  [facility] and Adult Medicine [address] [place], [place] [address] [phone] Cell (Monday-Friday 8 AM - 5 PM) [phone] After 5 PM and follow prompts

## 2017-10-20 ENCOUNTER — Ambulatory Visit: Payer: Medicare Other | Admitting: Internal Medicine

## 2017-10-29 ENCOUNTER — Encounter (HOSPITAL_COMMUNITY): Payer: Self-pay | Admitting: Internal Medicine

## 2017-10-29 ENCOUNTER — Emergency Department (HOSPITAL_COMMUNITY)
Admission: EM | Admit: 2017-10-29 | Discharge: 2017-10-29 | Disposition: A | Discharge status: Home / Self Care | Payer: Medicare Other | Attending: Emergency Medicine | Admitting: Emergency Medicine

## 2017-10-29 ENCOUNTER — Emergency Department (HOSPITAL_COMMUNITY): Payer: Medicare Other

## 2017-10-29 DIAGNOSIS — Y9301 Activity, walking, marching and hiking: Secondary | ICD-10-CM | POA: Insufficient documentation

## 2017-10-29 DIAGNOSIS — W19XXXA Unspecified fall, initial encounter: Secondary | ICD-10-CM | POA: Diagnosis not present

## 2017-10-29 DIAGNOSIS — I10 Essential (primary) hypertension: Secondary | ICD-10-CM | POA: Diagnosis not present

## 2017-10-29 DIAGNOSIS — Z7982 Long term (current) use of aspirin: Secondary | ICD-10-CM | POA: Insufficient documentation

## 2017-10-29 DIAGNOSIS — R58 Hemorrhage, not elsewhere classified: Secondary | ICD-10-CM | POA: Diagnosis not present

## 2017-10-29 DIAGNOSIS — Z23 Encounter for immunization: Secondary | ICD-10-CM | POA: Insufficient documentation

## 2017-10-29 DIAGNOSIS — Z79899 Other long term (current) drug therapy: Secondary | ICD-10-CM | POA: Diagnosis not present

## 2017-10-29 DIAGNOSIS — S80212A Abrasion, left knee, initial encounter: Secondary | ICD-10-CM | POA: Diagnosis not present

## 2017-10-29 DIAGNOSIS — S0993XA Unspecified injury of face, initial encounter: Secondary | ICD-10-CM | POA: Diagnosis not present

## 2017-10-29 DIAGNOSIS — S8002XA Contusion of left knee, initial encounter: Secondary | ICD-10-CM | POA: Insufficient documentation

## 2017-10-29 DIAGNOSIS — F0390 Unspecified dementia without behavioral disturbance: Secondary | ICD-10-CM | POA: Insufficient documentation

## 2017-10-29 DIAGNOSIS — Y92129 Unspecified place in nursing home as the place of occurrence of the external cause: Secondary | ICD-10-CM | POA: Diagnosis not present

## 2017-10-29 DIAGNOSIS — W0110XA Fall on same level from slipping, tripping and stumbling with subsequent striking against unspecified object, initial encounter: Secondary | ICD-10-CM | POA: Insufficient documentation

## 2017-10-29 DIAGNOSIS — Y999 Unspecified external cause status: Secondary | ICD-10-CM | POA: Insufficient documentation

## 2017-10-29 DIAGNOSIS — S0083XA Contusion of other part of head, initial encounter: Secondary | ICD-10-CM | POA: Insufficient documentation

## 2017-10-29 DIAGNOSIS — S199XXA Unspecified injury of neck, initial encounter: Secondary | ICD-10-CM | POA: Diagnosis not present

## 2017-10-29 DIAGNOSIS — M2550 Pain in unspecified joint: Secondary | ICD-10-CM | POA: Diagnosis not present

## 2017-10-29 DIAGNOSIS — M25561 Pain in right knee: Secondary | ICD-10-CM | POA: Diagnosis not present

## 2017-10-29 DIAGNOSIS — S0990XA Unspecified injury of head, initial encounter: Secondary | ICD-10-CM | POA: Diagnosis not present

## 2017-10-29 DIAGNOSIS — Z7401 Bed confinement status: Secondary | ICD-10-CM | POA: Diagnosis not present

## 2017-10-29 MED ORDER — TETANUS-DIPHTH-ACELL PERTUSSIS 5-2.5-18.5 LF-MCG/0.5 IM SUSP
0.5000 mL | Freq: Once | INTRAMUSCULAR | Status: AC
  Administered 2017-10-29: 0.5 mL via INTRAMUSCULAR
  Filled 2017-10-29: qty 0.5

## 2017-10-29 NOTE — ED Triage Notes (Signed)
Pt here from [facility] after falling this morning. Pt's two front teeth are displaced in her mouth and pushed into her gum line. Pt hx of dementia. Per EMS, staff at [facility] unable to provide information regarding pt's cognitive baseline or blood thinner status. Pt remembers fall and reports mechanical fall. Denies neck/back pain, no head pain, no LOC. Abrasion noted to left knee. Vital signs stable at this time.

## 2017-10-29 NOTE — ED Notes (Signed)
Pt returned to room from CT scan

## 2017-10-29 NOTE — ED Provider Notes (Signed)
[facility] EMERGENCY DEPARTMENT Provider Note   CSN: [id number] Arrival date & time: 10/29/17  9326     History   Chief Complaint Chief Complaint  Patient presents with  . Fall    HPI [name] [name] is a 84 y.o. female.  She presents here today by EMS after a fall.  Level 5 caveat secondary to dementia.  She is a resident of [facility] and states she was out walking around the building as per her usual when she fell striking her face and her knees.  She does recall the trauma but does not know why she fell.  She is complaining of some knee pain some facial pain.  But no headache no loss of consciousness.  She rates the pain is mild to moderate and throbbing in nature worse with any movement.  The history is provided by the patient.  Fall  This is a new problem. The current episode started 1 to 2 hours ago. The problem occurs constantly. The problem has not changed since onset.Pertinent negatives include no chest pain, no abdominal pain, no headaches and no shortness of breath. The symptoms are aggravated by bending. Nothing relieves the symptoms. She has tried nothing for the symptoms. The treatment provided no relief.    Past Medical History:  Diagnosis Date  . Arthritis    Osteoarthritis  . Chicken pox    as a child  . Hypertension   . Mumps    as a child  . MVP (mitral valve prolapse)     Patient Active Problem List   Diagnosis Date Noted  . DDD (degenerative disc disease), lumbar 03/29/2015  . Leukopenia 03/29/2015  . Dementia without behavioral disturbance 03/27/2015  . Alopecia 03/27/2015  . Nail biting 07/16/2013  . Finger infection 07/16/2013  . Loss of weight 12/03/2012  . Hypertension 07/12/2010  . Mitral valve prolapse 07/12/2010    Past Surgical History:  Procedure Laterality Date  . ABDOMINAL HYSTERECTOMY    . CATARACT EXTRACTION Right 11 2013  . EYE SURGERY    . 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 TONSILLECTOMY AND  ADENOIDECTOMY       OB History   None      Home Medications    Prior to Admission medications   Medication Sig Start Date End Date Taking? Authorizing Provider  aspirin EC 81 MG tablet Take 81 mg by mouth daily.    [provider]  brimonidine (ALPHAGAN P) 0.1 % SOLN Place 1 drop 2 (two) times daily into both eyes.    [provider]  hydrochlorothiazide (HYDRODIURIL) 25 MG tablet Take 1 tablet (25 mg total) by mouth daily. 06/26/17   Gildardo Cranker, DO  lisinopril (PRINIVIL,ZESTRIL) 10 MG tablet TAKE 1 TABLET BY MOUTH EVERY DAY TO CONTROL BLOOD PRESSURE 07/05/17   Gildardo Cranker, DO  potassium chloride SA (KLOR-CON M20) 20 MEQ tablet Take 1 tablet (20 mEq total) by mouth daily. 05/23/17   Gildardo Cranker, DO  PREVIDENT 5000 ENAMEL PROTECT 1.1-5 % PSTE See admin instructions. Use daily as directed 05/19/17   [provider]    Family History Family History  Problem Relation Age of Onset  . Alzheimer's disease Mother   . Cancer Father   . Diabetes Sister   . Hypertension Sister   . Hypertension Brother   . Diabetes Brother     Social History Social History   Tobacco Use  . Smoking status: Never Smoker  . Smokeless  tobacco: Never Used  Substance Use Topics  . Alcohol use: Never    Frequency: Never  . Drug use: No     Allergies   Exelon [rivastigmine tartrate]   Review of Systems Review of Systems  Constitutional: Negative for fever.  HENT: Negative for sore throat and trouble swallowing.   Eyes: Negative for visual disturbance.  Respiratory: Negative for shortness of breath.   Cardiovascular: Negative for chest pain.  Gastrointestinal: Negative for abdominal pain.  Genitourinary: Negative for dysuria.  Skin: Positive for wound. Negative for rash.  Neurological: Negative for headaches.     Physical Exam Updated Vital Signs BP (!) 169/80   Pulse 83   Temp 98.6 F (37 C) (Oral)   Resp 18   Ht 5\' 6"  (1.676 m)   Wt 59 kg (130 lb)    SpO2 100%   BMI 20.98 kg/m   Physical Exam  Constitutional: She appears well-developed and well-nourished.  HENT:  Head: Normocephalic.  Right Ear: External ear normal.  Left Ear: External ear normal.  Nose: Nose normal.  Mouth/Throat: Oropharynx is clear and moist.  She has some swelling of her upper lip and some blood in her mouth and her upper central incisors are both impacted up into her maxilla.  She has no trismus.  Eyes: Pupils are equal, round, and reactive to light.  Neck: Normal range of motion. No tracheal deviation present.  Cardiovascular: Normal rate, regular rhythm, normal heart sounds and intact distal pulses.  Pulmonary/Chest: Effort normal and breath sounds normal.  Abdominal: Soft. She exhibits no mass. There is no tenderness. There is no guarding.  Musculoskeletal: Normal range of motion. She exhibits tenderness. She exhibits no deformity.  She has full range of motion of her upper and lower extremities.  She has some abrasions and tenderness over her left patella.  There is no significant effusion.  No obvious foreign bodies.  Neurological: She is alert. She has normal strength. She is disoriented (time and place). No sensory deficit. GCS eye subscore is 4. GCS verbal subscore is 5. GCS motor subscore is 6.  Nursing note and vitals reviewed.    ED Treatments / Results  Labs (all labs ordered are listed, but only abnormal results are displayed) Labs Reviewed - No data to display  EKG None  Radiology Ct Head Wo Contrast  Result Date: 10/29/2017 CLINICAL DATA:  [place] [address] today and struck her face.  Dementia. EXAM: CT HEAD WITHOUT CONTRAST CT MAXILLOFACIAL WITHOUT CONTRAST CT CERVICAL SPINE WITHOUT CONTRAST TECHNIQUE: Multidetector CT imaging of the head, cervical spine, and maxillofacial structures were performed using the standard protocol without intravenous contrast. Multiplanar CT image reconstructions of the cervical spine and maxillofacial structures were also  generated. COMPARISON:  Head CT dated 08/17/2017 and soft tissue neck CT dated 07/15/2016. FINDINGS: CT HEAD FINDINGS Brain: Diffusely enlarged ventricles and subarachnoid spaces. Patchy white matter low density in both cerebral hemispheres. No intracranial hemorrhage, mass lesion or CT evidence of acute infarction. Vascular: No hyperdense vessel or unexpected calcification. Skull: Normal. Negative for fracture or focal lesion. Other: None. CT MAXILLOFACIAL FINDINGS Osseous: No fracture or mandibular dislocation. No destructive process. Orbits: Status post bilateral cataract extraction. Otherwise, unremarkable. Sinuses: Bilateral maxillary sinus retention cysts. Minimal fluid in the sphenoid sinus on the left. Soft tissues: Unremarkable. CT CERVICAL SPINE FINDINGS Alignment: Reversal of the normal cervical lordosis. Minimal anterolisthesis at the C3-4 level. Minimal retrolisthesis at the C6-7 level. Skull base and vertebrae: No acute fracture. No primary bone lesion or  focal pathologic process. Soft tissues and spinal canal: No prevertebral fluid or swelling. No visible canal hematoma. Disc levels:  Multilevel degenerative changes. Upper chest: Clear lung apices with minimal biapical pleural scarring. Other: None. IMPRESSION: 1. No skull fracture or intracranial hemorrhage. 2. No maxillofacial fracture. 3. No cervical spine fracture or traumatic subluxation. 4. Stable diffuse cerebral and cerebellar atrophy. 5. Stable chronic small vessel white matter ischemic changes in both cerebral hemispheres. 6. Minimal acute sphenoid sinusitis. 7. Reversal of the normal cervical lordosis and multilevel cervical spine degenerative changes. Electronically Signed   By: Claudie Revering M.D.   On: 10/29/2017 12:03   Ct Cervical Spine Wo Contrast  Result Date: 10/29/2017 CLINICAL DATA:  [place] [address] today and struck her face.  Dementia. EXAM: CT HEAD WITHOUT CONTRAST CT MAXILLOFACIAL WITHOUT CONTRAST CT CERVICAL SPINE WITHOUT CONTRAST  TECHNIQUE: Multidetector CT imaging of the head, cervical spine, and maxillofacial structures were performed using the standard protocol without intravenous contrast. Multiplanar CT image reconstructions of the cervical spine and maxillofacial structures were also generated. COMPARISON:  Head CT dated 08/17/2017 and soft tissue neck CT dated 07/15/2016. FINDINGS: CT HEAD FINDINGS Brain: Diffusely enlarged ventricles and subarachnoid spaces. Patchy white matter low density in both cerebral hemispheres. No intracranial hemorrhage, mass lesion or CT evidence of acute infarction. Vascular: No hyperdense vessel or unexpected calcification. Skull: Normal. Negative for fracture or focal lesion. Other: None. CT MAXILLOFACIAL FINDINGS Osseous: No fracture or mandibular dislocation. No destructive process. Orbits: Status post bilateral cataract extraction. Otherwise, unremarkable. Sinuses: Bilateral maxillary sinus retention cysts. Minimal fluid in the sphenoid sinus on the left. Soft tissues: Unremarkable. CT CERVICAL SPINE FINDINGS Alignment: Reversal of the normal cervical lordosis. Minimal anterolisthesis at the C3-4 level. Minimal retrolisthesis at the C6-7 level. Skull base and vertebrae: No acute fracture. No primary bone lesion or focal pathologic process. Soft tissues and spinal canal: No prevertebral fluid or swelling. No visible canal hematoma. Disc levels:  Multilevel degenerative changes. Upper chest: Clear lung apices with minimal biapical pleural scarring. Other: None. IMPRESSION: 1. No skull fracture or intracranial hemorrhage. 2. No maxillofacial fracture. 3. No cervical spine fracture or traumatic subluxation. 4. Stable diffuse cerebral and cerebellar atrophy. 5. Stable chronic small vessel white matter ischemic changes in both cerebral hemispheres. 6. Minimal acute sphenoid sinusitis. 7. Reversal of the normal cervical lordosis and multilevel cervical spine degenerative changes. Electronically Signed   By:  Claudie Revering M.D.   On: 10/29/2017 12:03   Dg Knee Complete 4 Views Left  Result Date: 10/29/2017 CLINICAL DATA:  Anterior left knee pain and abrasion following a fall this morning. EXAM: LEFT KNEE - COMPLETE 4+ VIEW COMPARISON:  None. FINDINGS: Moderate-sized anterior patellar enthesophyte. No fracture, dislocation or effusion. Atheromatous arterial calcifications. IMPRESSION: No fracture. Electronically Signed   By: Claudie Revering M.D.   On: 10/29/2017 12:04   Dg Knee Complete 4 Views Right  Result Date: 10/29/2017 CLINICAL DATA:  Right knee pain following a fall this morning. EXAM: RIGHT KNEE - COMPLETE 4+ VIEW COMPARISON:  None. FINDINGS: Moderate to large sized anterior patellar enthesophyte. No fracture, dislocation or effusion. IMPRESSION: No fracture. Electronically Signed   By: Claudie Revering M.D.   On: 10/29/2017 12:05   Ct Maxillofacial Wo Cm  Result Date: 10/29/2017 CLINICAL DATA:  [place] [address] today and struck her face.  Dementia. EXAM: CT HEAD WITHOUT CONTRAST CT MAXILLOFACIAL WITHOUT CONTRAST CT CERVICAL SPINE WITHOUT CONTRAST TECHNIQUE: Multidetector CT imaging of the head, cervical spine, and maxillofacial structures were performed using  the standard protocol without intravenous contrast. Multiplanar CT image reconstructions of the cervical spine and maxillofacial structures were also generated. COMPARISON:  Head CT dated 08/17/2017 and soft tissue neck CT dated 07/15/2016. FINDINGS: CT HEAD FINDINGS Brain: Diffusely enlarged ventricles and subarachnoid spaces. Patchy white matter low density in both cerebral hemispheres. No intracranial hemorrhage, mass lesion or CT evidence of acute infarction. Vascular: No hyperdense vessel or unexpected calcification. Skull: Normal. Negative for fracture or focal lesion. Other: None. CT MAXILLOFACIAL FINDINGS Osseous: No fracture or mandibular dislocation. No destructive process. Orbits: Status post bilateral cataract extraction. Otherwise, unremarkable.  Sinuses: Bilateral maxillary sinus retention cysts. Minimal fluid in the sphenoid sinus on the left. Soft tissues: Unremarkable. CT CERVICAL SPINE FINDINGS Alignment: Reversal of the normal cervical lordosis. Minimal anterolisthesis at the C3-4 level. Minimal retrolisthesis at the C6-7 level. Skull base and vertebrae: No acute fracture. No primary bone lesion or focal pathologic process. Soft tissues and spinal canal: No prevertebral fluid or swelling. No visible canal hematoma. Disc levels:  Multilevel degenerative changes. Upper chest: Clear lung apices with minimal biapical pleural scarring. Other: None. IMPRESSION: 1. No skull fracture or intracranial hemorrhage. 2. No maxillofacial fracture. 3. No cervical spine fracture or traumatic subluxation. 4. Stable diffuse cerebral and cerebellar atrophy. 5. Stable chronic small vessel white matter ischemic changes in both cerebral hemispheres. 6. Minimal acute sphenoid sinusitis. 7. Reversal of the normal cervical lordosis and multilevel cervical spine degenerative changes. Electronically Signed   By: Claudie Revering M.D.   On: 10/29/2017 12:03    Procedures Procedures (including critical care time)  Medications Ordered in ED Medications  Tdap (BOOSTRIX) injection 0.5 mL (has no administration in time range)     Initial Impression / Assessment and Plan / ED Course  I have reviewed the triage vital signs and the nursing notes.  Pertinent labs & imaging results that were available during my care of the patient were reviewed by me and considered in my medical decision making (see chart for details).  Clinical Course as of Oct 30 1807  Nancy Fetter Oct 29, 2017  1345 Patient lives in assisted living and walks independently without any assistance.  Her imaging does not show any acute fractures.  We are going to try to get her up after we clean up her knee wounds and see if she is be able to return home.   [MB]  1510 She ambulated here without any difficulty.  Feel  she can go home and have close follow-up with her primary care doctor in dentist to evaluate her malposition teeth.   [MB]    Clinical Course User Index [MB] Hayden Rasmussen, MD     Final Clinical Impressions(s) / ED Diagnoses   Final diagnoses:  Dental injury, initial encounter  Contusion of face, initial encounter  Contusion of left knee, initial encounter    ED Discharge Orders    None       Hayden Rasmussen, MD 10/29/17 [phone]

## 2017-10-29 NOTE — ED Notes (Signed)
Pt ambulated well. Pt stated she was dizzy at first when she stood up, but when she started to walk she felt much better. Pt was steady on her feet.

## 2017-10-29 NOTE — ED Notes (Signed)
Patient transported to CT 

## 2017-10-29 NOTE — ED Notes (Signed)
Wound dressed with telfa non-stick dressing. Irrigated with normal saline.

## 2017-10-29 NOTE — Discharge Instructions (Addendum)
Your evaluated in the emergency department for a fall in which she struck your face and your knees.  You had CAT scans and x-rays that did not show any obvious fracture.  Your front upper 2 teeth have been pushed and and this will need to be followed with a dentist.  Please have a soft diet and you can take Tylenol for pain.  Use ice  to to your injured areas.  Please return if any worsening symptoms.

## 2017-11-21 DIAGNOSIS — H401232 Low-tension glaucoma, bilateral, moderate stage: Secondary | ICD-10-CM | POA: Diagnosis not present

## 2017-11-21 DIAGNOSIS — H04123 Dry eye syndrome of bilateral lacrimal glands: Secondary | ICD-10-CM | POA: Diagnosis not present

## 2017-11-22 ENCOUNTER — Ambulatory Visit (INDEPENDENT_AMBULATORY_CARE_PROVIDER_SITE_OTHER): Payer: Medicare Other | Admitting: Internal Medicine

## 2017-11-22 VITALS — BP 138/80 | HR 75 | Temp 98.2°F | Resp 10 | Ht 65.0 in | Wt 131.0 lb

## 2017-11-22 DIAGNOSIS — W19XXXA Unspecified fall, initial encounter: Secondary | ICD-10-CM | POA: Diagnosis not present

## 2017-11-22 DIAGNOSIS — F0390 Unspecified dementia without behavioral disturbance: Secondary | ICD-10-CM | POA: Diagnosis not present

## 2017-11-22 DIAGNOSIS — N183 Chronic kidney disease, stage 3 unspecified: Secondary | ICD-10-CM

## 2017-11-22 DIAGNOSIS — S0993XA Unspecified injury of face, initial encounter: Secondary | ICD-10-CM | POA: Diagnosis not present

## 2017-11-22 DIAGNOSIS — I10 Essential (primary) hypertension: Secondary | ICD-10-CM | POA: Diagnosis not present

## 2017-11-22 NOTE — Progress Notes (Signed)
Patient ID: [name] [name], female   DOB: 26-Nov-1931, 84 y.o.   MRN: [id number]   Location:  [facility] OFFICE  Provider: DR Arletha Grippe  Code Status:  Goals of Care:  Advanced Directives 07/12/2017  Does Patient Have a Medical Advance Directive? Yes  Type of Advance Directive [place]  Does patient want to make changes to medical advance directive? No - Patient declined  Copy of [place] in Chart? Yes  Would patient like information on creating a medical advance directive? -     Chief Complaint  Patient presents with  . Medical Management of Chronic Issues    2 month follow-up, + fall risk, progression with memory loss, here with Neice [name] [name]  . Screening    Audit C Screening, neg    HPI: Patient is a 84 y.o. female seen today for medical management of chronic diseases.  Pt fell 10/29/17 onto her face while going out for a walk at ALF. She broke 2 teeth and sustained abrasion to left knee. She does not recall falling but only remembers "waking up" in her apt after she returned from the ED. xrays of b/l knee revealed right and left mod-large bone spur but no fx/effusion. CT maxillo-facial/C spine/head revealed no skull fx/ICH, no maxillofacial fx, no acute process in C spine; stable diffuse cerebral and cerebellar atrophy; stable chronic small vessel white matter ischemic changes; min acute sphenoid sinusitis and cervical DDD. Her niece, [name] [name] continues to be c/a her memory worsening - she forgets to take her medications, dates, appts. She would like to get palliative care involved as she states she needs a home care aid to assist with ADLs and sit with her daily. Pt is a poor historian due to dementia. Hx obtained from chart and pt's niece.   Dementia - progressing. MMSE 18/30 (prev 22/30 in July 2018). She has tried aricept and namenda in the past but suffered severe HA; exelon patch caused dizziness. She lives at [facility]. Nurse manages her  medication. Cr 1.27. She has been trying to remember to drink more water. Weight stable. CT head wo contrast revealed no acute process but did show small vessel ischemic disease and atrophic changes in cerebrum and cerebellum.  HTN - stable on lisinopril and HCTZ + KCl. She takes ASA daily  Glaucoma - stable. followed by eye provider. She uses alphagan P gtts  Past Medical History:  Diagnosis Date  . Arthritis    Osteoarthritis  . Chicken pox    as a child  . Hypertension   . Mumps    as a child  . MVP (mitral valve prolapse)     Past Surgical History:  Procedure Laterality Date  . ABDOMINAL HYSTERECTOMY    . CATARACT EXTRACTION Right 11 2013  . EYE SURGERY    . 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 TONSILLECTOMY AND ADENOIDECTOMY       reports that she has never smoked. She has never used smokeless tobacco. She reports that she does not drink alcohol or use drugs. Social History   Socioeconomic History  . Marital status: Widowed    Spouse name: Not on file  . Number of children: Not on file  . Years of education: Not on file  . Highest education level: Not on file  Occupational History  . Occupation: retired Art therapist   Social Needs  . Financial resource strain: Not hard at all  . Food insecurity:  Worry: Never true    Inability: Never true  . Transportation needs:    Medical: No    Non-medical: No  Tobacco Use  . Smoking status: Never Smoker  . Smokeless tobacco: Never Used  Substance and Sexual Activity  . Alcohol use: Never    Frequency: Never  . Drug use: No  . Sexual activity: Never  Lifestyle  . Physical activity:    Days per week: 2 days    Minutes per session: 30 min  . Stress: Only a little  Relationships  . Social connections:    Talks on phone: More than three times a week    Gets together: More than three times a week    Attends religious service: Never    Active member of club or organization: Yes    Attends meetings of clubs or  organizations: More than 4 times per year    Relationship status: Widowed  . Intimate partner violence:    Fear of current or ex partner: No    Emotionally abused: No    Physically abused: No    Forced sexual activity: No  Other Topics Concern  . Not on file  Social History Narrative   Retired since 2004. Thought middle school in [place] masters degree widowed.  re located to [place]         Diet:   Do you drink/eat things with caffeine? Sometimes   Marital status: Widow                          What year were you married?   Do you live in a house, apartment, assisted living, condo, trailer, etc)? Condo   Is it one or more stories? Yes   How many persons live in your home? 1   Do you have any pets in your home? 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 Yes                                                    Type & how often: walking daily   Do you have a living will?  No   Do you have a DNR Form? No   Do you have a POA/HPOA forms? Yes       Family History  Problem Relation Age of Onset  . Alzheimer's disease Mother   . Cancer Father   . Diabetes Sister   . Hypertension Sister   . Hypertension Brother   . Diabetes Brother     Allergies  Allergen Reactions  . Exelon [Rivastigmine Tartrate] Other (See Comments)    Dizziness, stomach aches, confusion and paranoid     Outpatient Encounter Medications as of 11/22/2017  Medication Sig  . aspirin EC 81 MG tablet Take 81 mg by mouth daily.  . brimonidine (ALPHAGAN P) 0.1 % SOLN Place 1 drop 2 (two) times daily into both eyes.  . hydrochlorothiazide (HYDRODIURIL) 25 MG tablet Take 1 tablet (25 mg total) by mouth daily.  Marland Kitchen lisinopril (PRINIVIL,ZESTRIL) 10 MG tablet TAKE 1 TABLET BY MOUTH EVERY DAY TO CONTROL BLOOD PRESSURE  . potassium chloride SA (KLOR-CON M20) 20 MEQ tablet Take 1 tablet (20 mEq total) by mouth daily.  Marland Kitchen PREVIDENT 5000 ENAMEL  PROTECT 1.1-5 %  PSTE Place 1 application onto teeth See admin instructions. Use daily as directed    No facility-administered encounter medications on file as of 11/22/2017.     Review of Systems:  Review of Systems  Unable to perform ROS: Dementia    Health Maintenance  Topic Date Due  . INFLUENZA VACCINE  11/09/2017  . TETANUS/TDAP  10/30/2027  . DEXA SCAN  Completed  . PNA vac Low Risk Adult  Completed    Physical Exam: Vitals:   11/22/17 1108  BP: 138/80  Pulse: 75  Resp: 10  Temp: 98.2 F (36.8 C)  TempSrc: Oral  SpO2: 97%  Weight: 131 lb (59.4 kg)  Height: 5\' 5"  (1.651 m)   Body mass index is 21.8 kg/m. Physical Exam  Constitutional: She appears well-developed and well-nourished.  Thin, looks well in NAD  HENT:  Mouth/Throat: Oropharynx is clear and moist. No oropharyngeal exudate.  Canine teeth broken; splint intact; MMM; no oral thrush  Eyes: Pupils are equal, round, and reactive to light. No scleral icterus.  Neck: Neck supple. Carotid bruit is not present. No tracheal deviation present. No thyromegaly present.  Cardiovascular: Normal rate, regular rhythm and intact distal pulses.  Occasional extrasystoles are present. Exam reveals no gallop and no friction rub.  Murmur (1/6 SEM) heard. No LE edema b/l. no calf TTP.   Pulmonary/Chest: Effort normal and breath sounds normal. No stridor. No respiratory distress. She has no wheezes. She has no rales.  Abdominal: Soft. Normal appearance and bowel sounds are normal. She exhibits no distension and no mass. There is no hepatomegaly. There is no tenderness. There is no rigidity, no rebound and no guarding. No hernia.  Musculoskeletal: She exhibits edema (left knee; small and large joints) and tenderness.  Lymphadenopathy:    She has no cervical adenopathy.  Neurological: She is alert. She has normal reflexes.  Skin: Skin is warm and dry. No rash noted.  Eschar noted left anterior knee  Psychiatric: She has a normal mood and  affect. Her behavior is normal.    Labs reviewed: Basic Metabolic Panel: Recent Labs    05/23/17 1525 08/18/17 1218  NA 143 146  K 4.1 4.1  CL 105 105  CO2 32 33*  GLUCOSE 74 75  BUN 37* 31*  CREATININE 1.34* 1.27*  CALCIUM 9.4 9.4  TSH 2.78  --    Liver Function Tests: Recent Labs    05/23/17 1525  AST 34  ALT 35*  BILITOT 0.4  PROT 7.6   No results for input(s): LIPASE, AMYLASE in the last 8760 hours. No results for input(s): AMMONIA in the last 8760 hours. CBC: Recent Labs    05/23/17 1525  WBC 2.5*  NEUTROABS 1,320*  HGB 11.9  HCT 37.1  MCV 89.8  PLT 167   Lipid Panel: No results for input(s): CHOL, HDL, LDLCALC, TRIG, CHOLHDL, LDLDIRECT in the last 8760 hours. No results found for: HGBA1C  Procedures since last visit: Ct Head Wo Contrast  Result Date: 10/29/2017 CLINICAL DATA:  [place] today and struck her face.  Dementia. EXAM: CT HEAD WITHOUT CONTRAST CT MAXILLOFACIAL WITHOUT CONTRAST CT CERVICAL SPINE WITHOUT CONTRAST TECHNIQUE: Multidetector CT imaging of the head, cervical spine, and maxillofacial structures were performed using the standard protocol without intravenous contrast. Multiplanar CT image reconstructions of the cervical spine and maxillofacial structures were also generated. COMPARISON:  Head CT dated 08/17/2017 and soft tissue neck CT dated 07/15/2016. FINDINGS: CT HEAD FINDINGS Brain: Diffusely enlarged ventricles and  subarachnoid spaces. Patchy white matter low density in both cerebral hemispheres. No intracranial hemorrhage, mass lesion or CT evidence of acute infarction. Vascular: No hyperdense vessel or unexpected calcification. Skull: Normal. Negative for fracture or focal lesion. Other: None. CT MAXILLOFACIAL FINDINGS Osseous: No fracture or mandibular dislocation. No destructive process. Orbits: Status post bilateral cataract extraction. Otherwise, unremarkable. Sinuses: Bilateral maxillary sinus retention cysts. Minimal fluid in the  sphenoid sinus on the left. Soft tissues: Unremarkable. CT CERVICAL SPINE FINDINGS Alignment: Reversal of the normal cervical lordosis. Minimal anterolisthesis at the C3-4 level. Minimal retrolisthesis at the C6-7 level. Skull base and vertebrae: No acute fracture. No primary bone lesion or focal pathologic process. Soft tissues and spinal canal: No prevertebral fluid or swelling. No visible canal hematoma. Disc levels:  Multilevel degenerative changes. Upper chest: Clear lung apices with minimal biapical pleural scarring. Other: None. IMPRESSION: 1. No skull fracture or intracranial hemorrhage. 2. No maxillofacial fracture. 3. No cervical spine fracture or traumatic subluxation. 4. Stable diffuse cerebral and cerebellar atrophy. 5. Stable chronic small vessel white matter ischemic changes in both cerebral hemispheres. 6. Minimal acute sphenoid sinusitis. 7. Reversal of the normal cervical lordosis and multilevel cervical spine degenerative changes. Electronically Signed   By: Claudie Revering M.D.   On: 10/29/2017 12:03   Ct Cervical Spine Wo Contrast  Result Date: 10/29/2017 CLINICAL DATA:  [place] today and struck her face.  Dementia. EXAM: CT HEAD WITHOUT CONTRAST CT MAXILLOFACIAL WITHOUT CONTRAST CT CERVICAL SPINE WITHOUT CONTRAST TECHNIQUE: Multidetector CT imaging of the head, cervical spine, and maxillofacial structures were performed using the standard protocol without intravenous contrast. Multiplanar CT image reconstructions of the cervical spine and maxillofacial structures were also generated. COMPARISON:  Head CT dated 08/17/2017 and soft tissue neck CT dated 07/15/2016. FINDINGS: CT HEAD FINDINGS Brain: Diffusely enlarged ventricles and subarachnoid spaces. Patchy white matter low density in both cerebral hemispheres. No intracranial hemorrhage, mass lesion or CT evidence of acute infarction. Vascular: No hyperdense vessel or unexpected calcification. Skull: Normal. Negative for fracture or focal lesion.  Other: None. CT MAXILLOFACIAL FINDINGS Osseous: No fracture or mandibular dislocation. No destructive process. Orbits: Status post bilateral cataract extraction. Otherwise, unremarkable. Sinuses: Bilateral maxillary sinus retention cysts. Minimal fluid in the sphenoid sinus on the left. Soft tissues: Unremarkable. CT CERVICAL SPINE FINDINGS Alignment: Reversal of the normal cervical lordosis. Minimal anterolisthesis at the C3-4 level. Minimal retrolisthesis at the C6-7 level. Skull base and vertebrae: No acute fracture. No primary bone lesion or focal pathologic process. Soft tissues and spinal canal: No prevertebral fluid or swelling. No visible canal hematoma. Disc levels:  Multilevel degenerative changes. Upper chest: Clear lung apices with minimal biapical pleural scarring. Other: None. IMPRESSION: 1. No skull fracture or intracranial hemorrhage. 2. No maxillofacial fracture. 3. No cervical spine fracture or traumatic subluxation. 4. Stable diffuse cerebral and cerebellar atrophy. 5. Stable chronic small vessel white matter ischemic changes in both cerebral hemispheres. 6. Minimal acute sphenoid sinusitis. 7. Reversal of the normal cervical lordosis and multilevel cervical spine degenerative changes. Electronically Signed   By: Claudie Revering M.D.   On: 10/29/2017 12:03   Dg Knee Complete 4 Views Left  Result Date: 10/29/2017 CLINICAL DATA:  Anterior left knee pain and abrasion following a fall this morning. EXAM: LEFT KNEE - COMPLETE 4+ VIEW COMPARISON:  None. FINDINGS: Moderate-sized anterior patellar enthesophyte. No fracture, dislocation or effusion. Atheromatous arterial calcifications. IMPRESSION: No fracture. Electronically Signed   By: Claudie Revering M.D.   On: 10/29/2017 12:04  Dg Knee Complete 4 Views Right  Result Date: 10/29/2017 CLINICAL DATA:  Right knee pain following a fall this morning. EXAM: RIGHT KNEE - COMPLETE 4+ VIEW COMPARISON:  None. FINDINGS: Moderate to large sized anterior  patellar enthesophyte. No fracture, dislocation or effusion. IMPRESSION: No fracture. Electronically Signed   By: Claudie Revering M.D.   On: 10/29/2017 12:05   Ct Maxillofacial Wo Cm  Result Date: 10/29/2017 CLINICAL DATA:  [place] today and struck her face.  Dementia. EXAM: CT HEAD WITHOUT CONTRAST CT MAXILLOFACIAL WITHOUT CONTRAST CT CERVICAL SPINE WITHOUT CONTRAST TECHNIQUE: Multidetector CT imaging of the head, cervical spine, and maxillofacial structures were performed using the standard protocol without intravenous contrast. Multiplanar CT image reconstructions of the cervical spine and maxillofacial structures were also generated. COMPARISON:  Head CT dated 08/17/2017 and soft tissue neck CT dated 07/15/2016. FINDINGS: CT HEAD FINDINGS Brain: Diffusely enlarged ventricles and subarachnoid spaces. Patchy white matter low density in both cerebral hemispheres. No intracranial hemorrhage, mass lesion or CT evidence of acute infarction. Vascular: No hyperdense vessel or unexpected calcification. Skull: Normal. Negative for fracture or focal lesion. Other: None. CT MAXILLOFACIAL FINDINGS Osseous: No fracture or mandibular dislocation. No destructive process. Orbits: Status post bilateral cataract extraction. Otherwise, unremarkable. Sinuses: Bilateral maxillary sinus retention cysts. Minimal fluid in the sphenoid sinus on the left. Soft tissues: Unremarkable. CT CERVICAL SPINE FINDINGS Alignment: Reversal of the normal cervical lordosis. Minimal anterolisthesis at the C3-4 level. Minimal retrolisthesis at the C6-7 level. Skull base and vertebrae: No acute fracture. No primary bone lesion or focal pathologic process. Soft tissues and spinal canal: No prevertebral fluid or swelling. No visible canal hematoma. Disc levels:  Multilevel degenerative changes. Upper chest: Clear lung apices with minimal biapical pleural scarring. Other: None. IMPRESSION: 1. No skull fracture or intracranial hemorrhage. 2. No maxillofacial  fracture. 3. No cervical spine fracture or traumatic subluxation. 4. Stable diffuse cerebral and cerebellar atrophy. 5. Stable chronic small vessel white matter ischemic changes in both cerebral hemispheres. 6. Minimal acute sphenoid sinusitis. 7. Reversal of the normal cervical lordosis and multilevel cervical spine degenerative changes. Electronically Signed   By: Claudie Revering M.D.   On: 10/29/2017 12:03    Assessment/Plan   ICD-10-CM   1. Dementia without behavioral disturbance, unspecified dementia type - worse since fall F03.90 Amb Referral to Palliative Care  2. Essential hypertension I10   3. CKD (chronic kidney disease) stage 3, GFR 30-59 ml/min (HCC) N18.3   4. Fall, initial encounter (DOI 10/29/17) W19.XXXA   5. Dental injury, initial encounter S09.93XA    Encouraged her to walk with others instead of alone  Will call with palliative care referral  Continue current medications as ordered  Follow up in 2 mos for dementia, HTN    Harshaan Whang S. Perlie Gold  [facility] [address] [place], [place] [address] [phone] Cell (Monday-Friday 8 AM - 5 PM) [phone] After 5 PM and follow prompts

## 2017-11-22 NOTE — Patient Instructions (Addendum)
Will call with palliative care referral  Continue current medications as ordered  Follow up in 2 mos for dementia, HTN

## 2017-11-27 ENCOUNTER — Telehealth: Payer: Self-pay | Admitting: Licensed Clinical Social Worker

## 2017-11-27 NOTE — Telephone Encounter (Signed)
Palliative Care left a vm with patient to set up a home visit.

## 2017-11-29 ENCOUNTER — Encounter: Payer: Self-pay | Admitting: Internal Medicine

## 2017-12-01 ENCOUNTER — Telehealth: Payer: Self-pay | Admitting: Licensed Clinical Social Worker

## 2017-12-01 NOTE — Telephone Encounter (Signed)
Palliative Care SW returned phone call from patient's PCG and niece, [name].  She lives in [place] and will not be in [place] until September.  She requested SW and RN not visit patient until September 18th when she is available.  Niece reports patient has periods of  confusion and wants to be present to help explain the program to patient.

## 2017-12-27 ENCOUNTER — Non-Acute Institutional Stay: Payer: Medicare Other | Admitting: Licensed Clinical Social Worker

## 2017-12-27 ENCOUNTER — Non-Acute Institutional Stay: Payer: Medicare Other | Admitting: *Deleted

## 2017-12-27 VITALS — BP 162/72 | HR 75 | Resp 16 | Ht 67.0 in | Wt 132.0 lb

## 2017-12-27 DIAGNOSIS — Z515 Encounter for palliative care: Secondary | ICD-10-CM

## 2017-12-27 NOTE — Progress Notes (Signed)
COMMUNITY PALLIATIVE CARE SW NOTE  PATIENT NAME: [name] [name] DOB: 1931/08/19 MRN: [id number]  PRIMARY CARE PROVIDER: Gildardo Cranker, DO  RESPONSIBLE PARTY:  Acct ID - Guarantor Home Phone Work Phone Relationship Acct Type  [id number] CHERIE, LASALLE [phone]  Self P/F     [facility], [address], [place],  [address]     PLAN OF CARE and INTERVENTIONS:             1. GOALS OF CARE/ ADVANCE CARE PLANNING:  Niece would like patient treated with grace and dignity.  Patient is a full code. 2. SOCIAL/EMOTIONAL/SPIRITUAL ASSESSMENT/ INTERVENTIONS:  SW and Palliative Care RN, Daryl Eastern, met with patient and her niece, [name], in patient's apartment at [facility].  [name] is visiting from [place] and is patient's primary caregiver.  Patient stated she is divorced and does not have any children.  [name] is also caring for her mother, Gwyndolyn Saxon, who is patient's sister in [place].  Niece stated she began observing patient's short term memory loss about four years ago.  Patient's mother and two brothers also had dementia.  Patient was raised by her aunt who was a Therapist, nutritional.  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  [name] stated she can gauge patient's decline by how well she plays the piano.  Patient played an extensive piece by memory during the visit.  Patient was able to answer questions, but stated she did not know some things.   3. PATIENT/CAREGIVER EDUCATION/ COPING:  Provided education and flyer regarding the Palliative Care program.  Patient and her niece cope by expressing their feelings and using humor. 4. PERSONAL EMERGENCY PLAN:  The facility checks in everyday through a speaker system in the apartment.  If she does not respond, a staff member will check on her. 5. COMMUNITY RESOURCES COORDINATION/ HEALTH CARE NAVIGATION:  An agency called [facility] gives patient her medications every morning.  Her medications  are in a lock box. 6. FINANCIAL/LEGAL CONCERNS/INTERVENTIONS:  None per family.     SOCIAL HX:  Social History   Tobacco Use  . Smoking status: Never Smoker  . Smokeless tobacco: Never Used  Substance Use Topics  . Alcohol use: Never    Frequency: Never    CODE STATUS:  Full Code  ADVANCED DIRECTIVES: N MOST FORM COMPLETE:  N HOSPICE EDUCATION PROVIDED: N PPS:  Patient's appetite is normal.  She is able to stand and ambulate independently.   Duration of visit and documentation:  75 minutes.      [name] Kmarion [place], LCSW

## 2017-12-28 ENCOUNTER — Other Ambulatory Visit: Payer: Self-pay | Admitting: Internal Medicine

## 2017-12-28 NOTE — Telephone Encounter (Signed)
Venetia Constable, RN with [facility] called and stated that she fills patient's pill boxes and needs a refill sent to [facility] for HCTZ for patient. Faxed.

## 2017-12-28 NOTE — Progress Notes (Addendum)
COMMUNITY PALLIATIVE CARE RN NOTE  PATIENT NAME: [name] [name] DOB: [birth year] MRN: [id number]  PRIMARY CARE PROVIDER: Gildardo Cranker, DO  RESPONSIBLE PARTY:  Acct ID - Guarantor Home Phone Work Phone Relationship Acct Type  [id number] [name], [name] [phone]  Self P/F     [facility], [address] [place], [place] [address]    PLAN OF CARE and INTERVENTION:  1. ADVANCE CARE PLANNING/GOALS OF CARE: She wants to remain on AL unit at [facility] and avoid hospitalizations 2. PATIENT/CAREGIVER EDUCATION: Explained Palliative Care Services, Reinforced Safe Mobility 3. DISEASE STATUS: Joint visit made with [facility]. Met with patient and niece [name] [name], who was in visiting patient for a few days. [name] [name] reports that she is noticing worsening short term memory loss. She has arranged for a home health nurse to come in daily to administer patient medications. She is now placing medications in a lock box. In the past she was taking too much medication, as she was forgetting that she had already taken it. She moved from an apartment for [facility] about 6 months ago to her current location at [facility]. She enjoys attending facility activities and playing her piano in her apartment. She is currently still able to bathe, dress and toilet herself, however she usually chooses the same outfits daily. [name] [name] is going to speak with facility regarding assistance with dressing and making sure she is taking showers at least twice weekly. She is able to answer questions and make needs known. She is able to ambulate independently, but had to be told to make sure that she does not walk by herself outside anymore. She had a fall in July which resulted in her falling outside and hitting her knee and mouth on the concrete. When hitting her mouth, some of her teeth broke and her front 2 teeth were pushed up into her gums. She now wears braces on her teeth, which were  placed on yesterday. Her intake is good. [name] [name] states that she has gained about 21 lbs since being at the facility. Current wt 132 lbs per niece. She eats all 3 meals in the dining room at the facility. She is agreeable to having future Palliative Care visits.   HISTORY OF PRESENT ILLNESS: This is a 84 yo female who currently resides at [facility]. Palliative Care to follow patient to assess overall condition and assist with symptom management needs.    CODE STATUS: FULL CODE ADVANCED DIRECTIVES: Y (HCPOA) MOST FORM: no PPS: 50%   PHYSICAL EXAM:   VITALS: Today's Vitals   12/27/17 2224  BP: (!) 162/72  Pulse: 75  Resp: 16  SpO2: 95%  PainSc: 0-No pain    LUNGS: clear to auscultation  CARDIAC: Cor RRR EXTREMITIES: No edema SKIN: Skin color, texture, turgor normal. No rashes or lesions  NEURO: Alert and oriented to person/place, pleasant mood, ambulatory   (Duration of visit and documentation 75 minutes)    Daryl Eastern, RN, BSN

## 2018-01-12 ENCOUNTER — Other Ambulatory Visit: Payer: Self-pay | Admitting: Internal Medicine

## 2018-01-12 ENCOUNTER — Other Ambulatory Visit: Payer: Medicare Other | Admitting: Licensed Clinical Social Worker

## 2018-01-12 DIAGNOSIS — I10 Essential (primary) hypertension: Secondary | ICD-10-CM

## 2018-01-23 ENCOUNTER — Encounter: Payer: Self-pay | Admitting: Internal Medicine

## 2018-01-23 ENCOUNTER — Ambulatory Visit (INDEPENDENT_AMBULATORY_CARE_PROVIDER_SITE_OTHER): Payer: Medicare Other | Admitting: Internal Medicine

## 2018-01-23 VITALS — BP 130/76 | HR 79 | Temp 97.8°F | Ht 67.0 in | Wt 136.0 lb

## 2018-01-23 DIAGNOSIS — S0993XD Unspecified injury of face, subsequent encounter: Secondary | ICD-10-CM | POA: Diagnosis not present

## 2018-01-23 DIAGNOSIS — F0390 Unspecified dementia without behavioral disturbance: Secondary | ICD-10-CM | POA: Diagnosis not present

## 2018-01-23 DIAGNOSIS — N183 Chronic kidney disease, stage 3 unspecified: Secondary | ICD-10-CM

## 2018-01-23 DIAGNOSIS — Z79899 Other long term (current) drug therapy: Secondary | ICD-10-CM

## 2018-01-23 DIAGNOSIS — I10 Essential (primary) hypertension: Secondary | ICD-10-CM

## 2018-01-23 NOTE — Patient Instructions (Signed)
Continue current medications as ordered  Follow up with dentist and eye provider as scheduled  Will call with lab results  Follow up in 2 mos with [name] Mustache, NP for dementia and HTN.

## 2018-01-23 NOTE — Progress Notes (Signed)
Patient ID: [name] [name], female   DOB: 1931-06-26, 84 y.o.   MRN: [id number]   Location:  [facility] OFFICE  Provider: DR Arletha Grippe  Code Status:  Goals of Care:  Advanced Directives 07/12/2017  Does Patient Have a Medical Advance Directive? Yes  Type of Advance Directive [place]  Does patient want to make changes to medical advance directive? No - Patient declined  Copy of [place] in Chart? Yes  Would patient like information on creating a medical advance directive? -     Chief Complaint  Patient presents with  . Medical Management of Chronic Issues    2 month follow-up on HTN and Dementia     HPI: Patient is a 84 y.o. female seen today for medical management of chronic diseases.  She received braces in August 2019 to set her teeth after injuring mouth with her fall in July 2019. She felt her teeth "moving around" "everytime I look in the mirror". Braces feel tight. She will have them for at least 8 mos.  Her short term memory is still challenging but long term is mostly intact. She is a poor historian due to dementia. Hx obtained from chart and niece, [name].  Palliative care did come out and see her. They will be monitoring her monthly.  PREVIOUS HPI: "Pt fell 10/29/17 onto her face while going out for a walk at ALF. She broke 2 teeth and sustained abrasion to left knee. She does not recall falling but only remembers "waking up" in her apt after she returned from the ED. xrays of b/l knee revealed right and left mod-large bone spur but no fx/effusion. CT maxillo-facial/C spine/head revealed no skull fx/ICH, no maxillofacial fx, no acute process in C spine; stable diffuse cerebral and cerebellar atrophy; stable chronic small vessel white matter ischemic changes; min acute sphenoid sinusitis and cervical DDD. Her niece, [name] continues to be c/a her memory worsening - she forgets to take her medications, dates, appts. She would like to get  palliative care involved as she states she needs a home care aid to assist with ADLs and sit with her daily."  Dementia - progressing. MMSE 18/30 (prev 22/30 in July 2018). She has tried aricept and namenda in the past but suffered severe HA; exelon patch caused dizziness. She lives at [facility]. Nurse manages her medication. Cr 1.27. She has been trying to remember to drink more water. Weight stable. CT head wo contrast revealed no acute process but did show small vessel ischemic disease and atrophic changes in cerebrum and cerebellum.  HTN - stable on lisinopril and HCTZ + KCl. She takes ASA daily  Glaucoma - stable. followed by eye provider. She uses [facility]  Past Medical History:  Diagnosis Date  . Arthritis    Osteoarthritis  . Chicken pox    as a child  . Hypertension   . Mumps    as a child  . MVP (mitral valve prolapse)     Past Surgical History:  Procedure Laterality Date  . ABDOMINAL HYSTERECTOMY    . CATARACT EXTRACTION Right 11 2013  . EYE SURGERY    . 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 TONSILLECTOMY AND ADENOIDECTOMY       reports that she has never smoked. She has never used smokeless tobacco. She reports that she does not drink alcohol or use drugs. Social History   Socioeconomic History  . Marital status: Widowed  Spouse name: Not on file  . Number of children: Not on file  . Years of education: Not on file  . Highest education level: Not on file  Occupational History  . Occupation: retired Art therapist   Social Needs  . Financial resource strain: Not hard at all  . Food insecurity:    Worry: Never true    Inability: Never true  . Transportation needs:    Medical: No    Non-medical: No  Tobacco Use  . Smoking status: Never Smoker  . Smokeless tobacco: Never Used  Substance and Sexual Activity  . Alcohol use: Never    Frequency: Never  . Drug use: No  . Sexual activity: Never  Lifestyle  . Physical activity:    Days per week:  2 days    Minutes per session: 30 min  . Stress: Only a little  Relationships  . Social connections:    Talks on phone: More than three times a week    Gets together: More than three times a week    Attends religious service: Never    Active member of club or organization: Yes    Attends meetings of clubs or organizations: More than 4 times per year    Relationship status: Widowed  . Intimate partner violence:    Fear of current or ex partner: No    Emotionally abused: No    Physically abused: No    Forced sexual activity: No  Other Topics Concern  . Not on file  Social History Narrative   Retired since 2004. Thought middle school in [place] masters degree widowed.  re located to [place]         Diet:   Do you drink/eat things with caffeine? Sometimes   Marital status: Widow                          What year were you married?   Do you live in a house, apartment, assisted living, condo, trailer, etc)? Condo   Is it one or more stories? Yes   How many persons live in your home? 1   Do you have any pets in your home? 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 Yes                                                    Type & how often: walking daily   Do you have a living will?  No   Do you have a DNR Form? No   Do you have a POA/HPOA forms? Yes       Family History  Problem Relation Age of Onset  . Alzheimer's disease Mother   . Cancer Father   . Diabetes Sister   . Hypertension Sister   . Hypertension Brother   . Diabetes Brother     Allergies  Allergen Reactions  . Exelon [Rivastigmine Tartrate] Other (See Comments)    Dizziness, stomach aches, confusion and paranoid     Outpatient Encounter Medications as of 01/23/2018  Medication Sig  . aspirin EC 81 MG tablet Take 81 mg by mouth daily.  . brimonidine (ALPHAGAN P) 0.1 % SOLN Place 1 drop 2 (two) times daily into both eyes.  Marland Kitchen  hydrochlorothiazide  (HYDRODIURIL) 25 MG tablet TAKE 1 TABLET ONCE DAILY.  Marland Kitchen lisinopril (PRINIVIL,ZESTRIL) 10 MG tablet TAKE 1 TABLET DAILY TO CONTROL BLOOD PRESSURE.  Marland Kitchen potassium chloride SA (KLOR-CON M20) 20 MEQ tablet Take 1 tablet (20 mEq total) by mouth daily.  Marland Kitchen PREVIDENT 5000 ENAMEL PROTECT 1.1-5 % PSTE Place 1 application onto teeth See admin instructions. Use daily as directed    No facility-administered encounter medications on file as of 01/23/2018.     Review of Systems:  Review of Systems  Unable to perform ROS: Dementia    Health Maintenance  Topic Date Due  . INFLUENZA VACCINE  11/09/2017  . TETANUS/TDAP  10/30/2027  . DEXA SCAN  Completed  . PNA vac Low Risk Adult  Completed    Physical Exam: Vitals:   01/23/18 1306  BP: 130/76  Pulse: 79  Temp: 97.8 F (36.6 C)  TempSrc: Oral  SpO2: 98%  Weight: 136 lb (61.7 kg)  Height: 5' 7" (1.702 m)   Body mass index is 21.3 kg/m. Physical Exam  Constitutional: She appears well-developed and well-nourished.  HENT:  Mouth/Throat: Oropharynx is clear and moist. No oropharyngeal exudate.  Braces intact; canine teeth broken off at tip; MMM; no oral thrush  Eyes: Pupils are equal, round, and reactive to light. No scleral icterus.  Neck: Neck supple. Carotid bruit is not present. No tracheal deviation present. No thyromegaly present.  Cardiovascular: Normal rate, regular rhythm, normal heart sounds and intact distal pulses. Exam reveals no gallop and no friction rub.  No murmur heard. No LE edema b/l. no calf TTP.   Pulmonary/Chest: Effort normal and breath sounds normal. No stridor. No respiratory distress. She has no wheezes. She has no rales.  Abdominal: Soft. Normal appearance and bowel sounds are normal. She exhibits no distension and no mass. There is no hepatomegaly. There is no tenderness. There is no rigidity, no rebound and no guarding. No hernia.  Musculoskeletal: She exhibits edema (small joints).  Lymphadenopathy:    She has no  cervical adenopathy.  Neurological: She is alert. She has normal reflexes.  Skin: Skin is warm and dry. No rash noted.  Psychiatric: She has a normal mood and affect. Her behavior is normal. Thought content normal.    Labs reviewed: Basic Metabolic Panel: Recent Labs    05/23/17 1525 08/18/17 1218  NA 143 146  K 4.1 4.1  CL 105 105  CO2 32 33*  GLUCOSE 74 75  BUN 37* 31*  CREATININE 1.34* 1.27*  CALCIUM 9.4 9.4  TSH 2.78  --    Liver Function Tests: Recent Labs    05/23/17 1525  AST 34  ALT 35*  BILITOT 0.4  PROT 7.6   No results for input(s): LIPASE, AMYLASE in the last 8760 hours. No results for input(s): AMMONIA in the last 8760 hours. CBC: Recent Labs    05/23/17 1525  WBC 2.5*  NEUTROABS 1,320*  HGB 11.9  HCT 37.1  MCV 89.8  PLT 167   Lipid Panel: No results for input(s): CHOL, HDL, LDLCALC, TRIG, CHOLHDL, LDLDIRECT in the last 8760 hours. No results found for: HGBA1C  Procedures since last visit: No results found.  Assessment/Plan   ICD-10-CM   1. High risk medication use Z79.899 CMP with eGFR([facility])  2. Dental injury, subsequent encounter S09.93XD   3. Essential hypertension I10 CMP with eGFR([facility])  4. Dementia without behavioral disturbance, unspecified dementia type ([place]) F03.90   5. CKD (chronic kidney disease) stage 3, GFR 30-59 ml/min (HCC)  N18.3 CMP with eGFR([facility])   Continue current medications as ordered  Follow up with dentist and eye provider as scheduled  Will call with lab results  Follow up in 2 mos with Sherrie Mustache, NP for dementia and HTN.     S. Perlie Gold  [facility] [address] [place], [place] [address] [phone] Cell (Monday-Friday 8 AM - 5 PM) [phone] After 5 PM and follow prompts

## 2018-01-24 DIAGNOSIS — N183 Chronic kidney disease, stage 3 unspecified: Secondary | ICD-10-CM | POA: Insufficient documentation

## 2018-01-24 DIAGNOSIS — Z79899 Other long term (current) drug therapy: Secondary | ICD-10-CM | POA: Insufficient documentation

## 2018-01-24 LAB — COMPLETE METABOLIC PANEL WITH GFR
AG Ratio: 1.2 (calc) (ref 1.0–2.5)
ALBUMIN MSPROF: 4.1 g/dL (ref 3.6–5.1)
ALKALINE PHOSPHATASE (APISO): 40 U/L (ref 33–130)
ALT: 17 U/L (ref 6–29)
AST: 23 U/L (ref 10–35)
BUN / CREAT RATIO: 20 (calc) (ref 6–22)
BUN: 27 mg/dL — ABNORMAL HIGH (ref 7–25)
CHLORIDE: 102 mmol/L (ref 98–110)
CO2: 33 mmol/L — AB (ref 20–32)
Calcium: 9.4 mg/dL (ref 8.6–10.4)
Creat: 1.35 mg/dL — ABNORMAL HIGH (ref 0.60–0.88)
GFR, EST AFRICAN AMERICAN: 42 mL/min/{1.73_m2} — AB (ref >=60)
GFR, Est Non African American: 36 mL/min/{1.73_m2} — ABNORMAL LOW (ref >=60)
GLOBULIN: 3.4 g/dL (ref 1.9–3.7)
Glucose, Bld: 54 mg/dL — ABNORMAL LOW (ref 65–139)
POTASSIUM: 4.3 mmol/L (ref 3.5–5.3)
Sodium: 141 mmol/L (ref 135–146)
TOTAL PROTEIN: 7.5 g/dL (ref 6.1–8.1)
Total Bilirubin: 0.4 mg/dL (ref 0.2–1.2)

## 2018-02-06 ENCOUNTER — Non-Acute Institutional Stay: Payer: Medicare Other | Admitting: *Deleted

## 2018-02-06 ENCOUNTER — Other Ambulatory Visit: Payer: Medicare Other | Admitting: Licensed Clinical Social Worker

## 2018-02-06 DIAGNOSIS — Z515 Encounter for palliative care: Secondary | ICD-10-CM

## 2018-02-07 NOTE — Progress Notes (Signed)
COMMUNITY PALLIATIVE CARE SW NOTE  PATIENT NAME: [name] [name] DOB: 1931-11-21 MRN: [id number]  PRIMARY CARE PROVIDER: Gildardo Cranker, DO  RESPONSIBLE PARTY:  Acct ID - Guarantor Home Phone Work Phone Relationship Acct Type  [id number] - [name],[name] [phone]  Self P/F     [place], [address], [place], [place] [address]     PLAN OF CARE and INTERVENTIONS:             1. GOALS OF CARE/ ADVANCE CARE PLANNING:  Her niece would like patient treated with grace and dignity.  She is a full code. 2. SOCIAL/EMOTIONAL/SPIRITUAL ASSESSMENT/ INTERVENTIONS:  SW and Palliative Care RN, Monishia Howard,met with patient at [facility].  She was outside of the facility going for her daily walk.  She did not remember the team from the previous visit.  Reviewed safety precautions and she stated she understood.  She only stays on the facility property.  She stated her braced were uncomfortable, but stated no other complaints.  Provided active listening and supportive counseling. 3. PATIENT/CAREGIVER EDUCATION/ COPING:  Provided education regarding Palliative Care.  She copes by expressing her feelings openly. 4. PERSONAL EMERGENCY PLAN:  The facility checks on patient everyday through a speaker system in her apartment.  If she does not answer, a facility member will go to her apartment to check on her. 5. COMMUNITY RESOURCES COORDINATION/ HEALTH CARE NAVIGATION:  An agency nurse gives patient her medications every morning. 6. FINANCIAL/LEGAL CONCERNS/INTERVENTIONS:  None per family.     SOCIAL HX:  Social History   Tobacco Use  . Smoking status: Never Smoker  . Smokeless tobacco: Never Used  Substance Use Topics  . Alcohol use: Never    Frequency: Never    CODE STATUS:  Full Code  ADVANCED DIRECTIVES: N MOST FORM COMPLETE:  N HOSPICE EDUCATION PROVIDED: N PPS:  Patient reports her appetite is normal.  She stands and walks independently. Duration of visit and documentation:  30  minutes.      [name] Corn Orvella Digiulio, LCSW

## 2018-02-08 NOTE — Progress Notes (Addendum)
COMMUNITY PALLIATIVE CARE RN NOTE  PATIENT NAME: [name] [name] DOB: Jul 30, 1931 MRN: [id number]  PRIMARY CARE PROVIDER: Gildardo Cranker, DO  RESPONSIBLE PARTY:  Acct ID - Guarantor Home Phone Work Phone Relationship Acct Type  [id number] [name] [name] [phone]  Self P/F     [place], [address], [place], [place] [address]    PLAN OF CARE and INTERVENTION:  1. ADVANCE CARE PLANNING/GOALS OF CARE: Remain at current facility and avoid hospitalizations 2. PATIENT/CAREGIVER EDUCATION: Reinforced Safe Mobility and Fall Prevention 3. DISEASE STATUS: Joint visit made with Palliative Care SW, [name] [name]. Met with patient outside, as she was about to go for a walk around the facility campus, as she does daily. She denies any pain. She does report some discomfort/tightness with her braces she has on the top row of her teeth, placed s/p fall a few months ago for correction. She is alert and oriented x 2 (person/place), pleasant and conversational. She ambulates independently without the use of assistive devices and is able to dress, toilet and feed herself without assistance. Her niece has stressed to the patient the importance of remaining on the campus when walking for safety. Patient was able to voice this during visit. She continues with a home health RN to visit daily to administer her medications due to patient's forgetfulness and confusion.  Spoke with niece [name] [name] prior to visit, who reports that she was told by one of patient's friends that she is more forgetful and was unable to recognize her for the 1st time, but remained pleasant throughout interaction. Her intake is normal. No complaints at this time. Will continue to monitor.  HISTORY OF PRESENT ILLNESS:  This is a 84 yo female who resides at [place] unit. Palliative Care Team continues to follow patient. Will continue to visit monthly and PRN.  CODE STATUS: FULL CODE ADVANCED DIRECTIVES: Y (HCPOA) MOST FORM: no PPS:  50%   PHYSICAL EXAM:   LUNGS: clear to auscultation  CARDIAC: Cor RRR EXTREMITIES: No edema SKIN: Skin color, texture, turgor normal. No rashes or lesions  NEURO: Alert and oriented x 2, intermittent confusion, pleasant mood, ambulatory    (Duration of visit and documentation 45 minutes)   Daryl Eastern, RN, BSN

## 2018-02-28 DIAGNOSIS — M25775 Osteophyte, left foot: Secondary | ICD-10-CM | POA: Diagnosis not present

## 2018-02-28 DIAGNOSIS — L600 Ingrowing nail: Secondary | ICD-10-CM | POA: Diagnosis not present

## 2018-03-05 ENCOUNTER — Other Ambulatory Visit: Payer: Medicare Other | Admitting: Licensed Clinical Social Worker

## 2018-03-05 DIAGNOSIS — Z515 Encounter for palliative care: Secondary | ICD-10-CM

## 2018-03-05 NOTE — Progress Notes (Signed)
COMMUNITY PALLIATIVE CARE SW NOTE  PATIENT NAME: [name] [name] DOB: May 17, 1931 MRN: [id number]  PRIMARY CARE PROVIDER: Gildardo Cranker, DO  RESPONSIBLE PARTY:  Acct ID - Guarantor Home Phone Work Phone Relationship Acct Type  [id number] [name] [name] [phone]  Self P/F     [place], [address], [place], [place] [address]     PLAN OF CARE and INTERVENTIONS:             1. GOALS OF CARE/ ADVANCE CARE PLANNING:  Goal is for patient to be treated with dignity and grace.  Patient is a full code. 2. SOCIAL/EMOTIONAL/SPIRITUAL ASSESSMENT/ INTERVENTIONS:  SW met with patient in the atrium at [facility].  Her legs were propped up on an ottoman and her eyes closed.  She responded to gentle touch.  SW provided active listening and supportive counseling while patient discussed her loss of purpose.  She feels the facility is very large and does not provide the activities that she enjoys.  Patient was able to recall living by herself previously and enjoying it much better.  Patient's left big toe was bandaged and wrapped.  She said she cut it but does not remember how or when.  She denied pain.  SW encouraged patient to continue participating in activities she enjoys, like playing the piano. 3. PATIENT/CAREGIVER EDUCATION/ COPING:  Patient copes by expressing her feelings openly. 4. PERSONAL EMERGENCY PLAN:  The facility checks on patient everyday through a speaker system in her apartment.  If she does not answer, a facility member will go to her apartment to check on her. 5. COMMUNITY RESOURCES COORDINATION/ HEALTH CARE NAVIGATION:  An agency nurse gives patient her medications every morning from a lock box. 6. FINANCIAL/LEGAL CONCERNS/INTERVENTIONS:  None per family.     SOCIAL HX:  Social History   Tobacco Use  . Smoking status: Never Smoker  . Smokeless tobacco: Never Used  Substance Use Topics  . Alcohol use: Never    Frequency: Never    CODE STATUS:  Full Code  ADVANCED DIRECTIVES:  N MOST FORM COMPLETE:  N HOSPICE EDUCATION PROVIDED: N PPS:  Patient's appetite is normal.  She can stand and walk independently. Duration of visit and documentation:  60 minutes.      [name] Dan Dissinger, LCSW

## 2018-03-06 ENCOUNTER — Other Ambulatory Visit: Payer: Self-pay | Admitting: *Deleted

## 2018-03-06 MED ORDER — HYDROCHLOROTHIAZIDE 25 MG PO TABS: 25.0000 mg | ORAL_TABLET | Freq: Every day | ORAL | 0 refills | Status: DC

## 2018-03-06 MED ORDER — POTASSIUM CHLORIDE CRYS ER 20 MEQ PO TBCR: 20.0000 meq | EXTENDED_RELEASE_TABLET | Freq: Every day | ORAL | 0 refills | Status: DC

## 2018-03-06 NOTE — Telephone Encounter (Signed)
[place] 

## 2018-03-12 ENCOUNTER — Other Ambulatory Visit: Payer: Medicare Other | Admitting: Licensed Clinical Social Worker

## 2018-03-12 ENCOUNTER — Other Ambulatory Visit: Payer: Medicare Other | Admitting: *Deleted

## 2018-03-12 DIAGNOSIS — Z515 Encounter for palliative care: Secondary | ICD-10-CM

## 2018-03-12 NOTE — Progress Notes (Signed)
COMMUNITY PALLIATIVE CARE SW NOTE  PATIENT NAME: [name] [name] DOB: 1931/12/02 MRN: [id number]  PRIMARY CARE PROVIDER: Lauree Chandler, NP  RESPONSIBLE PARTY:  Acct ID - Guarantor Home Phone Work Phone Relationship Acct Type  [id number] [name] [phone]  Self P/F     [place], [address], [place], [place] [address]     PLAN OF CARE and INTERVENTIONS:             1. GOALS OF CARE/ ADVANCE CARE PLANNING:  Patient's niece wishes for patient to be treated with dignity and grace.  Patient is a full code. 2. SOCIAL/EMOTIONAL/SPIRITUAL ASSESSMENT/ INTERVENTIONS:  SW met with patient in the dining room at [facility].  She was getting ready to eat lunch.  She complained about the discomfort of her braces.  SW attempted to engage her in conversation regarding her niece.  She stated she did not recall her niece's name and the last time she saw her.  She had difficulty finding words regarding her healed toe.  Patient expressed no other concerns. 3. PATIENT/CAREGIVER EDUCATION/ COPING:  Patient copes by expressing her feelings openly. 4. PERSONAL EMERGENCY PLAN:  The facility checks on patient everyday through a speaker system in her apartment. If she does not answer, a facility member will go to her apartment to check on her. 5. COMMUNITY RESOURCES COORDINATION/ HEALTH CARE NAVIGATION:  An agency nurse gives patient her medications every morning from a lock box. 6. FINANCIAL/LEGAL CONCERNS/INTERVENTIONS:  None per family.     SOCIAL HX:  Social History   Tobacco Use  . Smoking status: Never Smoker  . Smokeless tobacco: Never Used  Substance Use Topics  . Alcohol use: Never    Frequency: Never   CODE STATUS:  Full Code  ADVANCED DIRECTIVES: N MOST FORM COMPLETE:  N HOSPICE EDUCATION PROVIDED: N PPS:  Patient's appetite is normal.  She can stand and walk independently. Duration of visit and documentation:  60 minutes.    [name] Mirayah Wren, LCSW

## 2018-03-13 ENCOUNTER — Telehealth: Payer: Self-pay | Admitting: Licensed Clinical Social Worker

## 2018-03-13 NOTE — Telephone Encounter (Signed)
Palliative Care SW left a vm with patient's niece, [name], and informed her of the visit with her aunt yesterday.

## 2018-03-14 NOTE — Progress Notes (Signed)
COMMUNITY PALLIATIVE CARE RN NOTE  PATIENT NAME: [name] [name] DOB: 04/16/1931 MRN: [id number]  PRIMARY CARE PROVIDER: Lauree Chandler, NP  RESPONSIBLE PARTY:  Acct ID - Guarantor Home Phone Work Phone Relationship Acct Type  [id number] [name] [name] [phone]  Self P/F     [place], [place] [address], [place], [place] [address]    PLAN OF CARE and INTERVENTION:  1. ADVANCE CARE PLANNING/GOALS OF CARE: Remain at current facility and avoid going to the hospital 2. PATIENT/CAREGIVER EDUCATION: Reinforced Safe Mobility 3. DISEASE STATUS: Met with patient along with Palliative Care SW in the lobby area of the facility. She is awake, alert, pleasant and smiling. She did report that she is noticing some days that she is more forgetful, especially in regards to her short term memory. She did recognize this RN, but did not remember my name or occupation. She also does not remember her niece's name who is her Primary Caregiver. She denies pain. No dyspnea noted. She remains very active and participates in multiple facility activities per day. She loves to walk and is able to do so without assistive devices. She continues to report slight tightness of her braces and difficulty closing her mouth over them, but despite this her intake remains good. She continues with home health aide coming in daily to make sure that she takes her medications. No complaints this visit. Will continue to monitor.  HISTORY OF PRESENT ILLNESS:  This is a 84 yo female who resides at [facility] in [place]. Palliative Care Team continues to follow patient. Will continue to visit monthly and PRN.   CODE STATUS: FULL CODE  ADVANCED DIRECTIVES: Y (HCPOA) MOST FORM: no PPS: 50%   PHYSICAL EXAM:   LUNGS: clear to auscultation  CARDIAC: Cor RRR EXTREMITIES: no edema SKIN: Skin color, texture, turgor normal. No rashes or lesions  NEURO: Alert and oriented x 2, poor short term memory, intermittent confusion,  ambulatory   (Duration of visit and documentation 30 minutes)    Daryl Eastern, RN, BSN

## 2018-03-15 ENCOUNTER — Encounter (HOSPITAL_COMMUNITY): Payer: Self-pay | Admitting: Emergency Medicine

## 2018-03-15 ENCOUNTER — Emergency Department (HOSPITAL_COMMUNITY): Payer: Medicare Other

## 2018-03-15 ENCOUNTER — Emergency Department (HOSPITAL_COMMUNITY)
Admission: EM | Admit: 2018-03-15 | Discharge: 2018-03-16 | Disposition: A | Discharge status: Home / Self Care | Payer: Medicare Other | Attending: Emergency Medicine | Admitting: Emergency Medicine

## 2018-03-15 DIAGNOSIS — N183 Chronic kidney disease, stage 3 (moderate): Secondary | ICD-10-CM | POA: Insufficient documentation

## 2018-03-15 DIAGNOSIS — Z79899 Other long term (current) drug therapy: Secondary | ICD-10-CM | POA: Diagnosis not present

## 2018-03-15 DIAGNOSIS — I129 Hypertensive chronic kidney disease with stage 1 through stage 4 chronic kidney disease, or unspecified chronic kidney disease: Secondary | ICD-10-CM | POA: Diagnosis not present

## 2018-03-15 DIAGNOSIS — F0280 Dementia in other diseases classified elsewhere without behavioral disturbance: Secondary | ICD-10-CM | POA: Diagnosis not present

## 2018-03-15 DIAGNOSIS — I10 Essential (primary) hypertension: Secondary | ICD-10-CM | POA: Diagnosis not present

## 2018-03-15 DIAGNOSIS — R079 Chest pain, unspecified: Secondary | ICD-10-CM | POA: Diagnosis not present

## 2018-03-15 DIAGNOSIS — R0789 Other chest pain: Secondary | ICD-10-CM | POA: Diagnosis not present

## 2018-03-15 DIAGNOSIS — Z7982 Long term (current) use of aspirin: Secondary | ICD-10-CM | POA: Insufficient documentation

## 2018-03-15 DIAGNOSIS — R072 Precordial pain: Secondary | ICD-10-CM | POA: Insufficient documentation

## 2018-03-15 DIAGNOSIS — G309 Alzheimer's disease, unspecified: Secondary | ICD-10-CM | POA: Insufficient documentation

## 2018-03-15 DIAGNOSIS — R404 Transient alteration of awareness: Secondary | ICD-10-CM | POA: Diagnosis not present

## 2018-03-15 HISTORY — DX: Dementia in other diseases classified elsewhere, unspecified severity, without behavioral disturbance, psychotic disturbance, mood disturbance, and anxiety: F02.80

## 2018-03-15 HISTORY — DX: Alzheimer's disease, unspecified: G30.9

## 2018-03-15 LAB — CBC
HCT: 38.3 % (ref 36.0–46.0)
Hemoglobin: 11.6 g/dL — ABNORMAL LOW (ref 12.0–15.0)
MCH: 27.7 pg (ref 26.0–34.0)
MCHC: 30.3 g/dL (ref 30.0–36.0)
MCV: 91.4 fL (ref 80.0–100.0)
Platelets: 173 K/uL (ref 150–400)
RBC: 4.19 MIL/uL (ref 3.87–5.11)
RDW: 12.6 % (ref 11.5–15.5)
WBC: 3 K/uL — ABNORMAL LOW (ref 4.0–10.5)
nRBC: 0 % (ref 0.0–0.2)

## 2018-03-15 LAB — BASIC METABOLIC PANEL
Anion gap: 10 (ref 5–15)
BUN: 23 mg/dL (ref 8–23)
CO2: 27 mmol/L (ref 22–32)
CREATININE: 1.2 mg/dL — AB (ref 0.44–1.00)
Calcium: 9.3 mg/dL (ref 8.9–10.3)
Chloride: 104 mmol/L (ref 98–111)
GFR calc Af Amer: 48 mL/min — ABNORMAL LOW (ref >60)
GFR calc non Af Amer: 41 mL/min — ABNORMAL LOW (ref >60)
GLUCOSE: 103 mg/dL — AB (ref 70–99)
Potassium: 3.6 mmol/L (ref 3.5–5.1)
Sodium: 141 mmol/L (ref 135–145)

## 2018-03-15 LAB — I-STAT TROPONIN, ED: Troponin i, poc: 0 ng/mL (ref 0.00–0.08)

## 2018-03-15 MED ORDER — DOXYCYCLINE HYCLATE 100 MG PO TABS
100.0000 mg | ORAL_TABLET | Freq: Once | ORAL | Status: AC
  Administered 2018-03-15: 100 mg via ORAL
  Filled 2018-03-15: qty 1

## 2018-03-15 MED ORDER — DOXYCYCLINE HYCLATE 100 MG PO CAPS: 100.0000 mg | ORAL_CAPSULE | Freq: Two times a day (BID) | ORAL | 0 refills | Status: DC

## 2018-03-15 NOTE — Discharge Instructions (Addendum)
Your chest xray today may demonstrate early pneumonia  You will be started on DOXYCYCLINE 100 mg PO BID  Please call your doctor for follow upo

## 2018-03-15 NOTE — ED Triage Notes (Addendum)
Patient arrived with EMS from an independent facility reports central chest pain while watching TV this evening , denies SOB , no nausea or diaphoresis , she received ASA 324 mg and 1 NTG sl with relief , rates pain 0/10 at arrival , CBG = 138. Poor historian - hx: Alzh.Dementia . Hypertensive at arrival 180 /88.

## 2018-03-15 NOTE — ED Notes (Signed)
PTAR CALLED @11 :24pm

## 2018-03-15 NOTE — ED Notes (Signed)
Pt's daughter updated on pt's condition and plan of care. 

## 2018-03-15 NOTE — ED Provider Notes (Signed)
[facility] EMERGENCY DEPARTMENT Provider Note   CSN: [id number] Arrival date & time: 03/15/18  2102     History   Chief Complaint Chief Complaint  Patient presents with  . Chest Pain   Level 5 caveat: Dementia  HPI [name] [name] is a 84 y.o. female.  HPI Patient is an 84 year old female presents to the emergency department with reported central chest discomfort while watching TV this evening.  Patient is unable to recall her chest pain.  She has a history of dementia.  Patient reports that she was having discomfort in her upper teeth.  She has braces on.  She reports that there was heat type sensation in her face.  No other information is available at this time.  No family is available in the room at this time.  For EMS she received aspirin and nitroglycerin with relief in her pain.  Her blood sugar was 138.     Past Medical History:  Diagnosis Date  . Alzheimer's dementia ([place])   . Arthritis    Osteoarthritis  . Chicken pox    as a child  . Hypertension   . Mumps    as a child  . MVP (mitral valve prolapse)     Patient Active Problem List   Diagnosis Date Noted  . High risk medication use 01/24/2018  . CKD (chronic kidney disease) stage 3, GFR 30-59 ml/min ([place]) 01/24/2018  . DDD (degenerative disc disease), lumbar 03/29/2015  . Leukopenia 03/29/2015  . Dementia without behavioral disturbance ([place]) 03/27/2015  . Alopecia 03/27/2015  . Nail biting 07/16/2013  . Finger infection 07/16/2013  . Loss of weight 12/03/2012  . Hypertension 07/12/2010  . Mitral valve prolapse 07/12/2010    Past Surgical History:  Procedure Laterality Date  . ABDOMINAL HYSTERECTOMY    . CATARACT EXTRACTION Right 11 2013  . EYE SURGERY    . 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 TONSILLECTOMY AND ADENOIDECTOMY       OB History   None      Home Medications    Prior to Admission medications   Medication Sig Start Date End Date Taking? Authorizing Provider    aspirin EC 81 MG tablet Take 81 mg by mouth daily.    [provider]  brimonidine (ALPHAGAN P) 0.1 % SOLN Place 1 drop 2 (two) times daily into both eyes.    [provider]  hydrochlorothiazide (HYDRODIURIL) 25 MG tablet Take 1 tablet (25 mg total) by mouth daily. 03/06/18   Lauree Chandler, NP  lisinopril (PRINIVIL,ZESTRIL) 10 MG tablet TAKE 1 TABLET DAILY TO CONTROL BLOOD PRESSURE. 01/12/18   Gildardo Cranker, DO  potassium chloride SA (KLOR-CON M20) 20 MEQ tablet Take 1 tablet (20 mEq total) by mouth daily. 03/06/18   Lauree Chandler, NP  PREVIDENT 5000 ENAMEL PROTECT 1.1-5 % PSTE Place 1 application onto teeth See admin instructions. Use daily as directed  05/19/17   [provider]    Family History Family History  Problem Relation Age of Onset  . Alzheimer's disease Mother   . Cancer Father   . Diabetes Sister   . Hypertension Sister   . Hypertension Brother   . Diabetes Brother     Social History Social History   Tobacco Use  . Smoking status: Never Smoker  . Smokeless tobacco: Never Used  Substance Use Topics  . Alcohol use: Never    Frequency: Never  . Drug use: No  Allergies   Exelon [rivastigmine tartrate]   Review of Systems Review of Systems  Unable to perform ROS: Dementia     Physical Exam Updated Vital Signs BP (!) 162/85   Pulse (!) 59   Resp 15   SpO2 100%   Physical Exam  Constitutional: She appears well-developed and well-nourished. No distress.  HENT:  Head: Normocephalic and atraumatic.  Eyes: EOM are normal.  Neck: Normal range of motion.  Cardiovascular: Normal rate, regular rhythm and normal heart sounds.  Pulmonary/Chest: Effort normal and breath sounds normal.  Abdominal: Soft. She exhibits no distension. There is no tenderness.  Musculoskeletal: Normal range of motion.  Neurological: She is alert.  Skin: Skin is warm and dry.  Psychiatric: She has a normal mood and affect. Judgment normal.   Nursing note and vitals reviewed.    ED Treatments / Results  Labs (all labs ordered are listed, but only abnormal results are displayed) Labs Reviewed  BASIC METABOLIC PANEL - Abnormal; Notable for the following components:      Result Value   Glucose, Bld 103 (*)    Creatinine, Ser 1.20 (*)    GFR calc non Af Amer 41 (*)    GFR calc Af Amer 48 (*)    All other components within normal limits  CBC - Abnormal; Notable for the following components:   WBC 3.0 (*)    Hemoglobin 11.6 (*)    All other components within normal limits  I-STAT TROPONIN, ED    EKG EKG Interpretation  Date/Time:  Thursday March 15 2018 21:09:38 EST Ventricular Rate:  62 PR Interval:    QRS Duration: 98 QT Interval:  441 QTC Calculation: 448 R Axis:   77 Text Interpretation:  Sinus rhythm Atrial premature complex Anteroseptal infarct, age indeterminate No significant change was found Confirmed by Jola Schmidt [phone]) on 03/15/2018 9:17:24 PM   Radiology Dg Chest 2 View  Result Date: 03/15/2018 CLINICAL DATA:  Chest pain. EXAM: CHEST - 2 VIEW COMPARISON:  03/27/2015 and chest CT dated 04/08/2015. FINDINGS: Interval enlargement of the cardiac silhouette. Interval minimal patchy density at both lung bases. The lungs remain mildly hyperexpanded. Mild peribronchial thickening without significant change. Unremarkable bones. IMPRESSION: 1. Interval minimal patchy atelectasis or pneumonia at both lung bases. 2. Interval mild cardiomegaly. 3. Stable mild changes of COPD and chronic bronchitis. Electronically Signed   By: Claudie Revering M.D.   On: 03/15/2018 21:49    Procedures Procedures (including critical care time)  Medications Ordered in ED Medications - No data to display   Initial Impression / Assessment and Plan / ED Course  I have reviewed the triage vital signs and the nursing notes.  Pertinent labs & imaging results that were available during my care of the patient were reviewed by me and  considered in my medical decision making (see chart for details).     Patient is overall well-appearing.  She has no complaints at this time.  EKG without ischemic changes.  Troponin is negative.  Low suspicion for ACS.  Chest x-ray demonstrates atelectasis versus developing infiltrates.  I suspect these are more likely atelectasis but given her advanced age and her presentation of chest discomfort she will be covered with twice daily doxycycline x1 week for possible early developing pneumonia.  Recommended that she follow-up with her primary care physician this week.  Final Clinical Impressions(s) / ED Diagnoses   Final diagnoses:  None    ED Discharge Orders    None  Jola Schmidt, MD 03/15/18 [phone]

## 2018-03-16 DIAGNOSIS — Z7401 Bed confinement status: Secondary | ICD-10-CM | POA: Diagnosis not present

## 2018-03-16 DIAGNOSIS — M2550 Pain in unspecified joint: Secondary | ICD-10-CM | POA: Diagnosis not present

## 2018-03-28 DIAGNOSIS — L600 Ingrowing nail: Secondary | ICD-10-CM | POA: Diagnosis not present

## 2018-03-29 ENCOUNTER — Encounter: Payer: Self-pay | Admitting: Nurse Practitioner

## 2018-03-29 ENCOUNTER — Ambulatory Visit (INDEPENDENT_AMBULATORY_CARE_PROVIDER_SITE_OTHER): Payer: Medicare Other | Admitting: Nurse Practitioner

## 2018-03-29 VITALS — BP 150/80 | HR 96 | Temp 97.6°F | Ht 67.0 in | Wt 134.8 lb

## 2018-03-29 DIAGNOSIS — R079 Chest pain, unspecified: Secondary | ICD-10-CM | POA: Diagnosis not present

## 2018-03-29 DIAGNOSIS — F0390 Unspecified dementia without behavioral disturbance: Secondary | ICD-10-CM

## 2018-03-29 DIAGNOSIS — R634 Abnormal weight loss: Secondary | ICD-10-CM

## 2018-03-29 DIAGNOSIS — I10 Essential (primary) hypertension: Secondary | ICD-10-CM | POA: Diagnosis not present

## 2018-03-29 DIAGNOSIS — N183 Chronic kidney disease, stage 3 unspecified: Secondary | ICD-10-CM

## 2018-03-29 NOTE — Progress Notes (Signed)
Careteam: Patient Care Team: Lauree Chandler, NP as PCP - General (Geriatric Medicine) Duffy, [name], LCSW as Social Worker (Licensed Clinical Social Worker)  Advanced Directive information Does Patient Have a Catering manager?: No, Would patient like information on creating a medical advance directive?: No - Patient declined  Allergies  Allergen Reactions  . Exelon [Rivastigmine Tartrate] Other (See Comments)    Dizziness, stomach aches, confusion and paranoid     Chief Complaint  Patient presents with  . Follow-up    ED follow up and 2 month follow up     HPI: Patient is a 84 y.o. female seen in the office today to follow up ED. Previous pt of Dr Saralyn Pilar. Here with her niece who is her caregiver/POA She went to Ed on 03/15/18 .  EKG without ischemic changes.  Troponin is negative.  Low suspicion for ACS.  Chest x-ray demonstrates atelectasis versus developing infiltrates. It was suspected these are more likely atelectasis but given her advanced age and her presentation of chest discomfort she will be covered with twice daily doxycycline x1 week for possible early developing pneumonia. Completed course of doxycyline without difficulty.  No more chest pains. No shortness of breath or cough.   htn- elevated today, low sodium diet. Taking lisinopril and hctz with potassium supplement.   Went to podiatrist yesterday and had toenails cut   Dementia- living at [facility] in independent living with assistance with medication. Able to bath and change clothes dependently. Niece manages finances. SW and nurse following with palliative care  Review of Systems:  Review of Systems  Unable to perform ROS: Dementia    Past Medical History:  Diagnosis Date  . Alzheimer's dementia ([place])   . Arthritis    Osteoarthritis  . Chicken pox    as a child  . Hypertension   . Mumps    as a child  . MVP (mitral valve prolapse)    Past Surgical History:  Procedure Laterality  Date  . ABDOMINAL HYSTERECTOMY    . CATARACT EXTRACTION Right 11 2013  . EYE SURGERY    . 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 TONSILLECTOMY AND ADENOIDECTOMY     Social History:   reports that she has never smoked. She has never used smokeless tobacco. She reports that she does not drink alcohol or use drugs.  Family History  Problem Relation Age of Onset  . Alzheimer's disease Mother   . Cancer Father   . Diabetes Sister   . Hypertension Sister   . Hypertension Brother   . Diabetes Brother     Medications: Patient's Medications  New Prescriptions   No medications on file  Previous Medications   ASPIRIN EC 81 MG TABLET    Take 81 mg by mouth daily.   BRIMONIDINE (ALPHAGAN P) 0.1 % SOLN    Place 1 drop 2 (two) times daily into both eyes.   HYDROCHLOROTHIAZIDE (HYDRODIURIL) 25 MG TABLET    Take 1 tablet (25 mg total) by mouth daily.   LISINOPRIL (PRINIVIL,ZESTRIL) 10 MG TABLET    TAKE 1 TABLET DAILY TO CONTROL BLOOD PRESSURE.   POTASSIUM CHLORIDE SA (KLOR-CON M20) 20 MEQ TABLET    Take 1 tablet (20 mEq total) by mouth daily.   PREVIDENT 5000 ENAMEL PROTECT 1.1-5 % PSTE    Place 1 application onto teeth See admin instructions. Use daily as directed   Modified Medications   No medications on file  Discontinued  Medications   DOXYCYCLINE (VIBRAMYCIN) 100 MG CAPSULE    Take 1 capsule (100 mg total) by mouth 2 (two) times daily.     Physical Exam:  Vitals:   03/29/18 1541  BP: (!) 150/80  Pulse: 96  Temp: 97.6 F (36.4 C)  TempSrc: Oral  SpO2: 99%  Weight: 134 lb 12.8 oz (61.1 kg)  Height: 5\' 7"  (1.702 m)   Body mass index is 21.11 kg/m.  Physical Exam Constitutional:      Appearance: Normal appearance. She is well-developed.  HENT:     Mouth/Throat:     Pharynx: No oropharyngeal exudate.     Comments: braces Eyes:     General: No scleral icterus.    Pupils: Pupils are equal, round, and reactive to light.  Neck:     Musculoskeletal: Neck supple.      Thyroid: No thyromegaly.     Vascular: No carotid bruit.     Trachea: No tracheal deviation.  Cardiovascular:     Rate and Rhythm: Normal rate and regular rhythm.     Heart sounds: Normal heart sounds. No murmur. No friction rub. No gallop.      Comments: No LE edema b/l. no calf TTP.  Pulmonary:     Effort: Pulmonary effort is normal. No respiratory distress.     Breath sounds: Normal breath sounds. No stridor. No wheezing or rales.  Abdominal:     General: Bowel sounds are normal. There is no distension.     Palpations: Abdomen is soft. Abdomen is not rigid. There is no hepatomegaly or mass.     Tenderness: There is no abdominal tenderness. There is no guarding or rebound.     Hernia: No hernia is present.  Lymphadenopathy:     Cervical: No cervical adenopathy.  Skin:    General: Skin is warm and dry.     Findings: No rash.  Neurological:     Mental Status: She is alert.     Deep Tendon Reflexes: Reflexes are normal and symmetric.  Psychiatric:        Behavior: Behavior normal.        Thought Content: Thought content normal.     Labs reviewed: Basic Metabolic Panel: Recent Labs    05/23/17 1525 08/18/17 1218 01/23/18 1410 03/15/18 2120  NA 143 146 141 141  K 4.1 4.1 4.3 3.6  CL 105 105 102 104  CO2 32 33* 33* 27  GLUCOSE 74 75 54* 103*  BUN 37* 31* 27* 23  CREATININE 1.34* 1.27* 1.35* 1.20*  CALCIUM 9.4 9.4 9.4 9.3  TSH 2.78  --   --   --    Liver Function Tests: Recent Labs    05/23/17 1525 01/23/18 1410  AST 34 23  ALT 35* 17  BILITOT 0.4 0.4  PROT 7.6 7.5   No results for input(s): LIPASE, AMYLASE in the last 8760 hours. No results for input(s): AMMONIA in the last 8760 hours. CBC: Recent Labs    05/23/17 1525 03/15/18 2120  WBC 2.5* 3.0*  NEUTROABS 1,320*  --   HGB 11.9 11.6*  HCT 37.1 38.3  MCV 89.8 91.4  PLT 167 173   Lipid Panel: No results for input(s): CHOL, HDL, LDLCALC, TRIG, CHOLHDL, LDLDIRECT in the last 8760 hours. TSH: Recent  Labs    05/23/17 1525  TSH 2.78   A1C: No results found for: HGBA1C   Assessment/Plan 1. Essential hypertension Slightly elevated today, will need to change medication if continues to be high,  continues on lisinopril with HCTZ.   2. Dementia without behavioral disturbance, unspecified dementia type ([place]) Stable. Not on medication. Resides at facility, no acute changes in cognitive or functional status.  3. CKD (chronic kidney disease) stage 3, GFR 30-59 ml/min ([facility]) Stable from recent labs, Encourage proper hydration and to avoid NSAIDS (Aleve, Advil, Motrin, Ibuprofen)   4. Chest pain, unspecified type resolved  5. Loss of weight Overall improved since initial visit- previously living along and now in facility which has been beneficial. Continue with good protein intake.   Next appt: 07/03/2018 Carlos American. [place] [phone]

## 2018-04-27 ENCOUNTER — Other Ambulatory Visit: Payer: Medicare Other | Admitting: Licensed Clinical Social Worker

## 2018-04-27 DIAGNOSIS — Z515 Encounter for palliative care: Secondary | ICD-10-CM

## 2018-04-30 NOTE — Progress Notes (Signed)
COMMUNITY PALLIATIVE CARE SW NOTE  PATIENT NAME: [name] [name] DOB: Dec 04, 1931 MRN: [id number]  PRIMARY CARE PROVIDER: Lauree Chandler, NP  RESPONSIBLE PARTY:  Acct ID - Guarantor Home Phone Work Phone Relationship Acct Type  [id number] [name] [phone]  Self P/F     [place], [place], [place], [place] [address]     PLAN OF CARE and INTERVENTIONS:             1. GOALS OF CARE/ ADVANCE CARE PLANNING:  Goal is for patient to be treated with grace and dignity.  She is a full code. 2. SOCIAL/EMOTIONAL/SPIRITUAL ASSESSMENT/ INTERVENTIONS:  SW met with patient in the lobby at [facility] where she was interacting with another resident.  Patient smiles and gives good eye contact.  Patient could not recall going to the hospital last month or when she last spoke with her niece last.  She is very open to stating when she does not know the answer to a question.  Patient denied pain. 3. PATIENT/CAREGIVER EDUCATION/ COPING:  Patient copes by expressing her feelings openly. 4. PERSONAL EMERGENCY PLAN:  [facility] calls patient daily through a speaker system in her apartment. If she does not answer, a facility member will go to her apartment to check on her. 5. COMMUNITY RESOURCES COORDINATION/ HEALTH CARE NAVIGATION:  A nurse from an outside agency gives patient her medication every morning from a lock box in her apartment. 6. FINANCIAL/LEGAL CONCERNS/INTERVENTIONS:  None per family.     SOCIAL HX:  Social History   Tobacco Use  . Smoking status: Never Smoker  . Smokeless tobacco: Never Used  Substance Use Topics  . Alcohol use: Never    Frequency: Never    CODE STATUS:  Full Code  ADVANCED DIRECTIVES: N MOST FORM COMPLETE:  N HOSPICE EDUCATION PROVIDED:  N PPS:  Patient's appetite is normal.  She stands and ambulates independently. Duration of visit and documentation:  45 minutes.      [name] Breon Rehm, LCSW

## 2018-05-01 ENCOUNTER — Ambulatory Visit: Payer: Self-pay | Admitting: Nurse Practitioner

## 2018-05-01 DIAGNOSIS — H4321 Crystalline deposits in vitreous body, right eye: Secondary | ICD-10-CM | POA: Diagnosis not present

## 2018-05-01 DIAGNOSIS — H401232 Low-tension glaucoma, bilateral, moderate stage: Secondary | ICD-10-CM | POA: Diagnosis not present

## 2018-05-18 ENCOUNTER — Other Ambulatory Visit: Payer: Medicare Other | Admitting: Licensed Clinical Social Worker

## 2018-05-18 DIAGNOSIS — Z515 Encounter for palliative care: Secondary | ICD-10-CM

## 2018-05-21 NOTE — Progress Notes (Signed)
COMMUNITY PALLIATIVE CARE SW NOTE  PATIENT NAME: [name] [name] DOB: Mar 18, 1932 MRN: [id number]  PRIMARY CARE PROVIDER: Lauree Chandler, NP  RESPONSIBLE PARTY:  Acct ID - Guarantor Home Phone Work Phone Relationship Acct Type  [id number] [name] [phone]  Self P/F     [place], [address], [place], [place] [address]     PLAN OF CARE and INTERVENTIONS:             1. GOALS OF CARE/ ADVANCE CARE PLANNING:  Goal is for patient to remain at [facility] as long as possible.  She is a full code. 2. SOCIAL/EMOTIONAL/SPIRITUAL ASSESSMENT/ INTERVENTIONS:  SW observed patient attending a music activity in the facility.  She was attentive.  Staff reported no concerns at this time. 3. PATIENT/CAREGIVER EDUCATION/ COPING:  Patient copes by expressing her feelings openly. 4. PERSONAL EMERGENCY PLAN:  [facility] calls patient through her speaker system daily. If she does not answer, a facility member will go to her apartment 5. COMMUNITY RESOURCES COORDINATION/ HEALTH CARE NAVIGATION:  A nurse from an agency gives patient her medicine every morning. 6. FINANCIAL/LEGAL CONCERNS/INTERVENTIONS:  None per family.     SOCIAL HX:  Social History   Tobacco Use  . Smoking status: Never Smoker  . Smokeless tobacco: Never Used  Substance Use Topics  . Alcohol use: Never    Frequency: Never    CODE STATUS:  Full Code  ADVANCED DIRECTIVES: N MOST FORM COMPLETE:  N HOSPICE EDUCATION PROVIDED:  N PPS:  Patient's appetite is normal per staff.  She ambulates independently. Duration of visit and documentation:  30 minutes.      [name] Eliazar Olivar, LCSW

## 2018-05-23 ENCOUNTER — Other Ambulatory Visit: Payer: Self-pay | Admitting: Nurse Practitioner

## 2018-05-23 DIAGNOSIS — I10 Essential (primary) hypertension: Secondary | ICD-10-CM

## 2018-05-30 ENCOUNTER — Other Ambulatory Visit: Payer: Medicare Other | Admitting: Licensed Clinical Social Worker

## 2018-05-30 ENCOUNTER — Other Ambulatory Visit: Payer: Medicare Other | Admitting: *Deleted

## 2018-05-30 DIAGNOSIS — Z515 Encounter for palliative care: Secondary | ICD-10-CM

## 2018-05-30 NOTE — Progress Notes (Signed)
COMMUNITY PALLIATIVE CARE SW NOTE  PATIENT NAME: [name] [name] DOB: 1931-11-23 MRN: [id number]  PRIMARY CARE PROVIDER: Lauree Chandler, NP  RESPONSIBLE PARTY:  Acct ID - Guarantor Home Phone Work Phone Relationship Acct Type  [id number] [name] [phone]  Self P/F     [place], [address], [place], [place] [address]     PLAN OF CARE and INTERVENTIONS:             1. GOALS OF CARE/ ADVANCE CARE PLANNING:  Niece's goal is for patient to remain at the facility.  She is currently a full code. 2. SOCIAL/EMOTIONAL/SPIRITUAL ASSESSMENT/ INTERVENTIONS:  SW and Palliative Care RN, Daryl Eastern, met with patient at [facility] in the common area.  She displayed a bright affect and engaged in conversation.  She stated her niece visited recently and a staff member confirmed.  She denied pain.  Patient stated she continues to adjust to the move to the facility, which has been several months.  She participates in facility activities, especially walking.  She will walk around the facility when it is too cold outside.  She also talked about how she continues to play her piano.  She responded positively to social interaction. 3. PATIENT/CAREGIVER EDUCATION/ COPING:  Patient expresses her feelings openly. 4. PERSONAL EMERGENCY PLAN:  The facility calls patient in her apartment through a speaker system daily.  Staff will go to her apartment if she does not answer. 5. COMMUNITY RESOURCES COORDINATION/ HEALTH CARE NAVIGATION:  An agency nurse gives patient her medicine every morning. 6. FINANCIAL/LEGAL CONCERNS/INTERVENTIONS:  None.     SOCIAL HX:  Social History   Tobacco Use  . Smoking status: Never Smoker  . Smokeless tobacco: Never Used  Substance Use Topics  . Alcohol use: Never    Frequency: Never    CODE STATUS:  Full Code  ADVANCED DIRECTIVES: N MOST FORM COMPLETE:  N HOSPICE EDUCATION PROVIDED: N PPS: Patient reports her appetite is normal.  She is able to stand and ambulate  independently. Duration of visit and documentation:  30 minutes.      [name] Anias Bartol, LCSW

## 2018-05-30 NOTE — Progress Notes (Signed)
COMMUNITY PALLIATIVE CARE RN NOTE  PATIENT NAME: [name] [name] DOB: 1931-10-13 MRN: [id number]  PRIMARY CARE PROVIDER: Lauree Chandler, NP  RESPONSIBLE PARTY:  Acct ID - Guarantor Home Phone Work Phone Relationship Acct Type  [id number] [name] [phone]  Self P/F     [place], [address] [place] [address], [place], [place] [address]    PLAN OF CARE and INTERVENTION:  1. ADVANCE CARE PLANNING/GOALS OF CARE: To remain at current facility. She is a Full Code 2. PATIENT/CAREGIVER EDUCATION: Reinforced Safe Mobility 3. DISEASE STATUS: Joint visit made with Palliative Care SW, Jasmine Awe, Met with patient in the lobby area at the facility. Patient is very pleasant and engaging. She is forgetful and repetitive at times. She denies pain. She enjoys walking, so is often found at various locations in and around the facility. She states that she is feeling better overall, and wants to be around people. At one point, she reports spending more time in her apartment and was somewhat upset that another resident was no longer speaking or spending time with her like she used to. She says that her niece from [place] visiting her within the past 2 weeks. This was confirmed by receptionist. Her intake is normal. An agency RN continues to visit each morning to give patient her medications. She is continent of both bowel and bladder. She continues to play her piano some in her apartment. She also says she is still trying to get used to being at this facility, but overall feels she is adjusting well. She looks well and has no current complaints or concerns. Will continue to monitor.    HISTORY OF PRESENT ILLNESS:  This is a 85 yo female who resides at [facility]. Palliative Care Team continues to follow patient. Team to continue to visit monthly and PRN.  CODE STATUS: Full Code ADVANCED DIRECTIVES: N MOST FORM: no PPS: 50%   PHYSICAL EXAM:   LUNGS: clear to auscultation  CARDIAC: Cor RRR EXTREMITIES: No  edema SKIN: Exposed skin is dry and intact  NEURO: Alert and oriented to person/place, forgetful, pleasant mood, ambulatory   (Duration of visit and documentation 30 minutes)    Daryl Eastern, RN, BSN

## 2018-06-14 ENCOUNTER — Other Ambulatory Visit: Payer: Medicare Other | Admitting: Licensed Clinical Social Worker

## 2018-06-14 ENCOUNTER — Other Ambulatory Visit: Payer: Medicare Other | Admitting: *Deleted

## 2018-06-14 DIAGNOSIS — Z515 Encounter for palliative care: Secondary | ICD-10-CM

## 2018-06-14 NOTE — Progress Notes (Signed)
COMMUNITY PALLIATIVE CARE SW NOTE  PATIENT NAME: [name] [name] DOB: 1931/09/18 MRN: [id number]  PRIMARY CARE PROVIDER: Lauree Chandler, NP  RESPONSIBLE PARTY:  Acct ID - Guarantor Home Phone Work Phone Relationship Acct Type  [id number] [name] [phone]  Self P/F     [place], [address], [place], [place] [address]     PLAN OF CARE and INTERVENTIONS:             1. GOALS OF CARE/ ADVANCE CARE PLANNING:  Goal is for patient to remain at the facility.  She is a full code. 2. SOCIAL/EMOTIONAL/SPIRITUAL ASSESSMENT/ INTERVENTIONS:  SW and Palliative Care RN, Daryl Eastern, met with patient in the lobby of [facility].  She welcomed the SW and RN with familiarity.  She displayed a bright affect and answered questions appropriately.  The visit ended due to a friend requesting she join her for a facility activity.  Patient denied pain and had no concerns at this time. 3. PATIENT/CAREGIVER EDUCATION/ COPING:  Patient copes by expressing her feelings openly and walking around the facility. 4. PERSONAL EMERGENCY PLAN: The facility calls her apartment through a speaker daily.  If she does not answer, the staff will go to her apartment.  5. COMMUNITY RESOURCES COORDINATION/ HEALTH CARE NAVIGATION:  A nurse from an agency provides patient with her medications every morning from a lock box. 6. FINANCIAL/LEGAL CONCERNS/INTERVENTIONS:  None.     SOCIAL HX:  Social History   Tobacco Use  . Smoking status: Never Smoker  . Smokeless tobacco: Never Used  Substance Use Topics  . Alcohol use: Never    Frequency: Never    CODE STATUS:  Full Code  ADVANCED DIRECTIVES: N MOST FORM COMPLETE:  N HOSPICE EDUCATION PROVIDED:  N PPS:  Patient's appetite is normal.  She can stand and ambulate independently. Duration of visit and documentation:  30 minutes.      [name] Nikodem Leadbetter, LCSW

## 2018-06-15 NOTE — Progress Notes (Signed)
COMMUNITY PALLIATIVE CARE RN NOTE  PATIENT NAME: [name] [name] DOB: [birth year] MRN: [id number]  PRIMARY CARE PROVIDER: Lauree Chandler, NP  RESPONSIBLE PARTY:  Acct ID - Guarantor Home Phone Work Phone Relationship Acct Type  [id number] [name] [phone]  Self P/F     [address], [place], [place] [address]    PLAN OF CARE and INTERVENTION:  1. ADVANCE CARE PLANNING/GOALS OF CARE: She wants to remain at current facility. She is a Full Code. 2. PATIENT/CAREGIVER EDUCATION: Reinforced Safe Mobility 3. DISEASE STATUS: Joint visit made with Palliative Care SW, Lynn Duffy. Met with patient in the common area of the facility. She is sitting in a chair performing arm exercises with small weights. She is pleasant and smiling. She is intermittently confused at times and forgetful. She does not remember names/relationships, but does recognize the person as being someone she knows. She denies pain. She remains very active and participates in facility activities. She is about to go walking with a group of other residents. She says her appetite remains good. She continues with a hired Therapist, sports that comes daily to make sure that she takes her medications. She is ambulatory without assistive devices. She does require some assistance with bathing and dressing, mainly prompting. She denies having any issues or concerns at this time. She is adjusting well to this facility. Will continue to monitor.  HISTORY OF PRESENT ILLNESS: This is a 85 yo female who resides at [facility]. Palliative Care Team continues to follow patient. Will continue to visit monthly and PRN.   CODE STATUS: Full Code ADVANCED DIRECTIVES: N MOST FORM: no PPS: 50%   PHYSICAL EXAM:   LUNGS: clear to auscultation  CARDIAC: Cor RRR EXTREMITIES: No edema SKIN: Exposed skin is dry and intact  NEURO: Alert and oriented to person/place, pleasant mood, forgetful, ambulatory   (Duration of visit and documentation 30  minutes)    Daryl Eastern, RN, BSN

## 2018-07-03 ENCOUNTER — Other Ambulatory Visit: Payer: Self-pay

## 2018-07-03 ENCOUNTER — Telehealth (INDEPENDENT_AMBULATORY_CARE_PROVIDER_SITE_OTHER): Payer: Medicare Other | Admitting: Nurse Practitioner

## 2018-07-03 DIAGNOSIS — F0390 Unspecified dementia without behavioral disturbance: Secondary | ICD-10-CM | POA: Diagnosis not present

## 2018-07-03 DIAGNOSIS — N183 Chronic kidney disease, stage 3 unspecified: Secondary | ICD-10-CM

## 2018-07-03 DIAGNOSIS — I10 Essential (primary) hypertension: Secondary | ICD-10-CM | POA: Diagnosis not present

## 2018-07-03 MED ORDER — LISINOPRIL 20 MG PO TABS: 20.0000 mg | ORAL_TABLET | Freq: Every day | ORAL | 3 refills | Status: DC

## 2018-07-03 NOTE — Patient Instructions (Signed)
Increase lisinopril to 20 mg daily  Recommend cutting back on sodium (foods high in sodium and any added salt)  Follow up in office in 8 weeks- blood pressure and weight.

## 2018-07-03 NOTE — Progress Notes (Signed)
Virtual Visit via Telephone Note  I connected with [name] [name] on 07/03/18 at 11:30 AM EDT by telephone and verified that I am speaking with the correct person using two identifiers.   I discussed the limitations, risks, security and privacy concerns of performing an evaluation and management service by telephone and the availability of in person appointments. I also discussed with the patient that there may be a patient responsible charge related to this service. The patient expressed understanding and agreed to proceed.  Patient has a follow up visit to discuss Dementia and HTN.Patient blood pressure has been elevated. This service is provided via telemedicine  No vital signs collected/recorded due to the encounter was a telemedicine visit.   Location of patient (ex: home, work):home  Patient consents to a telephone visit: YES  Location of the provider (ex: office, home): Office  Name of any referring provider: Dani Gobble   Names of all persons participating in the telemedicine service and their role in the encounter: [name] [name](Niece) Caregiver  Time spent on call: 5 mins  History of Present Illness:  2 hours ago Clayborn Heron was speaking with her niece over the phone and then she started to mumble (sounded like phone dropped) could hear movement. Demtria called the front desk and they came to her room stated that she fell. There was no injury.   Blood pressure was taken by staff 137/90 and temp was 98.7.    blood pressure high at facility  172/84 168/81 151/90 161/93  Memory is a lot worse. Lives alone in independent living. Niece has a nurse that helps her with bathing, dressing,  medication. Walking and going to exercise class. Nurse reports that she is eating and knows when it is mealtime.  Unsure if she ate this morning.  Weight loss- eating good per niece, she states she does not look like she has lost weight but can check with fitness ppl.    Observations/Objective:   Assessment and Plan: 1. CKD (chronic kidney disease) stage 3, GFR 30-59 ml/min (HCC) -stable on last labs. Continue proper hydration, avoiding NSADS. To follow up labs at next ov  2. Dementia without behavioral disturbance, unspecified dementia type ([place]) -ongoing progression noted. Niece has caregivers helping with ADLs.   3. Essential hypertension -not controlled. Will increase lisinopril to 20 mg by mouth daily at this time and have staff continue to monitor BP. To limit sodium. - lisinopril (PRINIVIL,ZESTRIL) 20 MG tablet; Take 1 tablet (20 mg total) by mouth daily.  Dispense: 90 tablet; Refill: 3   Follow Up Instructions:   I discussed the assessment and treatment plan with the patient. The patient was provided an opportunity to ask questions and all were answered. The patient agreed with the plan and demonstrated an understanding of the instructions.   The patient was advised to call back or seek an in-person evaluation if the symptoms worsen or if the condition fails to improve as anticipated.  I provided 16 minutes of non-face-to-face time during this encounter.  8 week follow up in office.  AVS was mailed.

## 2018-07-18 ENCOUNTER — Ambulatory Visit: Payer: Self-pay

## 2018-07-23 ENCOUNTER — Ambulatory Visit: Payer: Self-pay

## 2018-08-09 ENCOUNTER — Telehealth: Payer: Self-pay | Admitting: Licensed Clinical Social Worker

## 2018-08-09 NOTE — Telephone Encounter (Signed)
Palliative Care SW left a vm with patient niece, [name], and requested to schedule a virtual check-in visit.

## 2018-08-24 ENCOUNTER — Other Ambulatory Visit: Payer: Medicare Other | Admitting: Licensed Clinical Social Worker

## 2018-08-24 ENCOUNTER — Other Ambulatory Visit: Payer: Self-pay

## 2018-08-24 DIAGNOSIS — Z515 Encounter for palliative care: Secondary | ICD-10-CM

## 2018-08-27 NOTE — Progress Notes (Signed)
COMMUNITY PALLIATIVE CARE SW NOTE  PATIENT NAME: [name] [name] DOB: May 06, 1931 MRN: [id number]  PRIMARY CARE PROVIDER: Lauree Chandler, NP  RESPONSIBLE PARTY:  Acct ID - Guarantor Home Phone Work Phone Relationship Acct Type  [id number] IVAN, MASKELL [phone]  Self P/F     [address] [place], [place] [address]   Due to the COVID-19 crisis, this virtual check-in visit was done via telephone from my office and it was initiated and consent given by this patient and or family.  PLAN OF CARE and INTERVENTIONS:             1. GOALS OF CARE/ ADVANCE CARE PLANNING:  Patient's goal is to remain at [facility].  Patient is a full code. 2. SOCIAL/EMOTIONAL/SPIRITUAL ASSESSMENT/ INTERVENTIONS:  SW conducted a virtual check-in visit with patient's niece, [name].  She reports patient is more forgetful.  The isolation has caused the patient increased anxiety also.  A CNA is taking her blood pressure daily due to a recent spike.  Medications were changed per, [name], to address this issue.  Patient continues playing her piano.  SW provided active listening and supportive counseling while [name] expressed caregiver stress. 3. PATIENT/CAREGIVER EDUCATION/ COPING:  Patient and her niece express their feelings openly.  SW provided education regarding virtual check-in visits and she said she understood. 4. PERSONAL EMERGENCY PLAN:  Per facility protocol. 5. COMMUNITY RESOURCES COORDINATION/ HEALTH CARE NAVIGATION:  Facility staff are sitting with patient while she eats her meals, then they take her for a walk. 6. FINANCIAL/LEGAL CONCERNS/INTERVENTIONS:  None.     SOCIAL HX:  Social History   Tobacco Use  . Smoking status: Never Smoker  . Smokeless tobacco: Never Used  Substance Use Topics  . Alcohol use: Never    Frequency: Never    CODE STATUS:  Full Code  ADVANCED DIRECTIVES: N MOST FORM COMPLETE: N HOSPICE EDUCATION PROVIDED: N PPS:  Patient was not eating well when her  meals were dropped off in her room.  Now that she has facility staff with her during meals and prompting her, her appetite has improved.  She ambulates independently. Duration of visit and documentation:  45 minutes.      [name] Shaunita Seney, LCSW

## 2018-08-28 ENCOUNTER — Other Ambulatory Visit: Payer: Self-pay

## 2018-08-28 ENCOUNTER — Telehealth: Payer: Self-pay

## 2018-08-28 ENCOUNTER — Ambulatory Visit (INDEPENDENT_AMBULATORY_CARE_PROVIDER_SITE_OTHER): Payer: Medicare Other | Admitting: Nurse Practitioner

## 2018-08-28 ENCOUNTER — Encounter: Payer: Self-pay | Admitting: Nurse Practitioner

## 2018-08-28 ENCOUNTER — Ambulatory Visit: Payer: Medicare Other | Admitting: Nurse Practitioner

## 2018-08-28 VITALS — BP 118/72 | HR 72 | Temp 97.7°F | Ht 67.0 in | Wt 129.0 lb

## 2018-08-28 DIAGNOSIS — L723 Sebaceous cyst: Secondary | ICD-10-CM

## 2018-08-28 DIAGNOSIS — N183 Chronic kidney disease, stage 3 unspecified: Secondary | ICD-10-CM

## 2018-08-28 DIAGNOSIS — R634 Abnormal weight loss: Secondary | ICD-10-CM | POA: Diagnosis not present

## 2018-08-28 DIAGNOSIS — I10 Essential (primary) hypertension: Secondary | ICD-10-CM | POA: Diagnosis not present

## 2018-08-28 DIAGNOSIS — F0390 Unspecified dementia without behavioral disturbance: Secondary | ICD-10-CM

## 2018-08-28 LAB — CBC WITH DIFFERENTIAL/PLATELET
Absolute Monocytes: 428 cells/uL (ref 200–950)
Basophils Absolute: 30 cells/uL (ref 0–200)
Basophils Relative: 1.2 %
Eosinophils Absolute: 20 cells/uL (ref 15–500)
Eosinophils Relative: 0.8 %
HCT: 40.4 % (ref 35.0–45.0)
Hemoglobin: 12.9 g/dL (ref 11.7–15.5)
Lymphs Abs: 700 cells/uL — ABNORMAL LOW (ref 850–3900)
MCH: 28.2 pg (ref 27.0–33.0)
MCHC: 31.9 g/dL — ABNORMAL LOW (ref 32.0–36.0)
MCV: 88.4 fL (ref 80.0–100.0)
MPV: 11.5 fL (ref 7.5–12.5)
Monocytes Relative: 17.1 %
Neutro Abs: 1323 cells/uL — ABNORMAL LOW (ref 1500–7800)
Neutrophils Relative %: 52.9 %
Platelets: 186 10*3/uL (ref 140–400)
RBC: 4.57 10*6/uL (ref 3.80–5.10)
RDW: 12.5 % (ref 11.0–15.0)
Total Lymphocyte: 28 %
WBC: 2.5 10*3/uL — ABNORMAL LOW (ref 3.8–10.8)

## 2018-08-28 LAB — COMPLETE METABOLIC PANEL WITH GFR
AG Ratio: 1.3 (calc) (ref 1.0–2.5)
ALT: 13 U/L (ref 6–29)
AST: 21 U/L (ref 10–35)
Albumin: 4.1 g/dL (ref 3.6–5.1)
Alkaline phosphatase (APISO): 40 U/L (ref 37–153)
BUN/Creatinine Ratio: 16 (calc) (ref 6–22)
BUN: 19 mg/dL (ref 7–25)
CO2: 35 mmol/L — ABNORMAL HIGH (ref 20–32)
Calcium: 9.5 mg/dL (ref 8.6–10.4)
Chloride: 104 mmol/L (ref 98–110)
Creat: 1.2 mg/dL — ABNORMAL HIGH (ref 0.60–0.88)
GFR, Est African American: 48 mL/min/{1.73_m2} — ABNORMAL LOW (ref >=60)
GFR, Est Non African American: 41 mL/min/{1.73_m2} — ABNORMAL LOW (ref >=60)
Globulin: 3.1 g/dL (calc) (ref 1.9–3.7)
Glucose, Bld: 58 mg/dL — ABNORMAL LOW (ref 65–139)
Potassium: 4.1 mmol/L (ref 3.5–5.3)
Sodium: 143 mmol/L (ref 135–146)
Total Bilirubin: 0.5 mg/dL (ref 0.2–1.2)
Total Protein: 7.2 g/dL (ref 6.1–8.1)

## 2018-08-28 NOTE — Telephone Encounter (Signed)
Patient was in office today with caregiver [name]. [name] had a previous medication list and it indicated that patient is taking 1/2 tablet of potassium (hand written) although we have it listed as a whole tablet.  I called [name] (patients niece) to clarify. Per [name] whom is in [place] and was unable to make it to appointment today, patient is taking whole tablet. Patient 1/2's  tablet to assist with swallowing for it is fairly large, yet she takes both 1/2's.   I thanked [name] for clearing the confusion. [name] then asked if blood work and a urine sample was collected for patient appears to have some increased confusion. I informed Demtria that we did collect blood work yet no mention of increased confusion was bought up at appointment. No urine sample was collected.   I advised that I will send message to Lauree Chandler, NP to see if she would recommend we have patient come back to collect a urine sample or if we will wait to see what blood work shows first.  Please advise

## 2018-08-28 NOTE — Telephone Encounter (Signed)
Thanks for clarifying potassium order. Caregiver noted slow decline in memory without acute changes, no mention of urinary symptoms. we did blood work and if there is any concerns of infection with blood counts we would collect urine at that time.

## 2018-08-28 NOTE — Progress Notes (Signed)
Careteam: Patient Care Team: Lauree Chandler, NP as PCP - General (Geriatric Medicine) Duffy, [name], LCSW as Social Worker (Licensed Clinical Social Worker)  Advanced Directive information Does Patient Have a Catering manager?: Yes, Type of Advance Directive: Healthcare Power of [place], Does patient want to make changes to medical advance directive?: No - Patient declined  Allergies  Allergen Reactions  . Exelon [Rivastigmine Tartrate] Other (See Comments)    Dizziness, stomach aches, confusion and paranoid     Chief Complaint  Patient presents with  . Medical Management of Chronic Issues    5 month follow-up.  Left ankle swelling off/on. Examine swollen gland under right arm. Swollen glan onset about 3 months. Here with [name] [name]   . Immunizations    Discuss need for shingrix   . Weight Loss    Patient with decreased appetitie and weight loss   . Medication Management    Discuss potassium, patient was advised to break in half      HPI: Patient is a 85 y.o. female seen in the office today for routine follow up.  Pt with hx of dementia, living at [facility] alone. Has a caregiver who comes in routinely. Weight loss- She has lost weight and caregiver noticed so they increased help and now has a service that brings her meals and sits with her to eat.  Dementia- memory continues to worsen. Slow decline without acute changes. No increase in anxiety or depression.   htn- blood pressure was elevated and lisinopril to 20 mg daily. She is also taking hctz with potassium supplement  Blood pressure is check every morning. Blood pressure is now being taken daily. caregiiver reports bp: 115/60, 126/80, 127/70 HR 72.  Reports potassium supplement is being cut in half. Nurse care managing her medication.   Reports swollen area right arm, not painful. Been going on for about 3 months. Caregiver massaged in when she bathed her and "oil was expressed" and it improved.    Review of Systems: limited due to dementia.  Review of Systems  Constitutional: Negative for chills, fever and weight loss.  HENT: Negative for tinnitus.   Respiratory: Negative for cough, sputum production and shortness of breath.   Cardiovascular: Negative for chest pain, palpitations and leg swelling.  Gastrointestinal: Negative for abdominal pain, constipation, diarrhea and heartburn.  Genitourinary: Negative for dysuria, frequency and urgency.  Musculoskeletal: Negative for back pain, falls, joint pain and myalgias.  Skin: Negative.   Neurological: Negative for dizziness and headaches.  Psychiatric/Behavioral: Positive for memory loss. Negative for depression. The patient does not have insomnia.     Past Medical History:  Diagnosis Date  . Alzheimer's dementia ([place])   . Arthritis    Osteoarthritis  . Chicken pox    as a child  . Hypertension   . Mumps    as a child  . MVP (mitral valve prolapse)    Past Surgical History:  Procedure Laterality Date  . ABDOMINAL HYSTERECTOMY    . CATARACT EXTRACTION Right 11 2013  . EYE SURGERY    . 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 TONSILLECTOMY AND ADENOIDECTOMY     Social History:   reports that she has never smoked. She has never used smokeless tobacco. She reports that she does not drink alcohol or use drugs.  Family History  Problem Relation Age of Onset  . Alzheimer's disease Mother   . Cancer Father   . Diabetes Sister   .  Hypertension Sister   . Hypertension Brother   . Diabetes Brother     Medications: Patient's Medications  New Prescriptions   No medications on file  Previous Medications   ASPIRIN EC 81 MG TABLET    Take 81 mg by mouth daily.   BRIMONIDINE (ALPHAGAN P) 0.1 % SOLN    Place 1 drop 2 (two) times daily into both eyes.   HYDROCHLOROTHIAZIDE (HYDRODIURIL) 25 MG TABLET    TAKE 1 TABLET ONCE DAILY.   LISINOPRIL (PRINIVIL,ZESTRIL) 20 MG TABLET    Take 1 tablet (20 mg total) by mouth daily.   POTASSIUM  CHLORIDE SA (K-DUR,KLOR-CON) 20 MEQ TABLET    TAKE 1 TABLET ONCE DAILY.  Modified Medications   No medications on file  Discontinued Medications   No medications on file     Physical Exam:  Vitals:   08/28/18 1046  BP: 118/72  Pulse: 72  Temp: 97.7 F (36.5 C)  TempSrc: Oral  Weight: 129 lb (58.5 kg)  Height: 5\' 7"  (1.702 m)   Body mass index is 20.2 kg/m.   Wt Readings from Last 3 Encounters:  08/28/18 129 lb (58.5 kg)  03/29/18 134 lb 12.8 oz (61.1 kg)  01/23/18 136 lb (61.7 kg)     Physical Exam Constitutional:      Appearance: Normal appearance. She is well-developed.  HENT:     Mouth/Throat:     Mouth: Mucous membranes are moist.  Eyes:     General: No scleral icterus.    Pupils: Pupils are equal, round, and reactive to light.  Neck:     Musculoskeletal: Neck supple.     Thyroid: No thyromegaly.     Vascular: No carotid bruit.     Trachea: No tracheal deviation.  Cardiovascular:     Rate and Rhythm: Normal rate and regular rhythm.     Heart sounds: Normal heart sounds. No murmur. No friction rub. No gallop.      Comments: No LE edema b/l. no calf TTP.  Pulmonary:     Effort: Pulmonary effort is normal. No respiratory distress.     Breath sounds: Normal breath sounds. No stridor. No wheezing or rales.  Abdominal:     General: Bowel sounds are normal. There is no distension.     Palpations: Abdomen is soft. Abdomen is not rigid. There is no hepatomegaly or mass.     Tenderness: There is no abdominal tenderness. There is no guarding or rebound.     Hernia: No hernia is present.  Lymphadenopathy:     Cervical: No cervical adenopathy.  Skin:    General: Skin is warm and dry.     Findings: No rash.     Comments: Small nontender sebaceous cyst under right axilla. No redness or drainage.   Neurological:     Mental Status: She is alert.     Deep Tendon Reflexes: Reflexes are normal and symmetric.  Psychiatric:        Behavior: Behavior normal.         Thought Content: Thought content normal.     Labs reviewed: Basic Metabolic Panel: Recent Labs    01/23/18 1410 03/15/18 2120  NA 141 141  K 4.3 3.6  CL 102 104  CO2 33* 27  GLUCOSE 54* 103*  BUN 27* 23  CREATININE 1.35* 1.20*  CALCIUM 9.4 9.3   Liver Function Tests: Recent Labs    01/23/18 1410  AST 23  ALT 17  BILITOT 0.4  PROT 7.5  No results for input(s): LIPASE, AMYLASE in the last 8760 hours. No results for input(s): AMMONIA in the last 8760 hours. CBC: Recent Labs    03/15/18 2120  WBC 3.0*  HGB 11.6*  HCT 38.3  MCV 91.4  PLT 173   Lipid Panel: No results for input(s): CHOL, HDL, LDLCALC, TRIG, CHOLHDL, LDLDIRECT in the last 8760 hours. TSH: No results for input(s): TSH in the last 8760 hours. A1C: No results found for: HGBA1C   Assessment/Plan 1. Dementia without behavioral disturbance, unspecified dementia type ([place]) -progressive decline. Lives alone at [facility] with caregivers checking on her frequently. Has meals set up and assistance with medication.  2. CKD (chronic kidney disease) stage 3, GFR 30-59 ml/min (HCC) -Encourage proper hydration and to avoid NSAIDS (Aleve, Advil, Motrin, Ibuprofen)  - COMPLETE METABOLIC PANEL WITH GFR - CBC with Differential/Platelets  3. Essential hypertension Blood pressure well controlled at this time. Will continue on lisinopril, hctz with potassium supplement - COMPLETE METABOLIC PANEL WITH GFR - CBC with Differential/Platelets  4. Sebaceous cyst of right axilla -small and non-tender at this time. Can use warm compress and massage if increases in size.   5. Loss of weight Weight has been trending down and meal service has been added. Eating well with people bringing her food and sitting with her during meal times. Also to ADD protein supplement daily which she already has but does not take consistently.   Next appt: 3 months for routine follow up and AwV [facility]iep Ollis [address] [place], [facility] [phone]

## 2018-08-28 NOTE — Telephone Encounter (Signed)
[name] [name] called back to inform me that she spoke with [name] (caregiver) and urine was clear. [name] [name] agrees that we should wait until blood work results back

## 2018-08-28 NOTE — Telephone Encounter (Signed)
Left detailed message on [name]'s voicemail with [name]'s response.  I instructed [name] to return call if she has questions or concerns

## 2018-08-28 NOTE — Patient Instructions (Signed)
To use warm compress under right arm. Can massage area and express. To notify for redness, warmth or increase in pain  To add supplement daily (does not replacement meal but adds protein)   Follow up in 3 months, also needs to schedule AWV

## 2018-09-05 ENCOUNTER — Emergency Department (HOSPITAL_COMMUNITY): Payer: Medicare Other

## 2018-09-05 ENCOUNTER — Emergency Department (HOSPITAL_COMMUNITY)
Admission: EM | Admit: 2018-09-05 | Discharge: 2018-09-05 | Disposition: A | Discharge status: Home / Self Care | Payer: Medicare Other | Attending: Emergency Medicine | Admitting: Emergency Medicine

## 2018-09-05 ENCOUNTER — Encounter (HOSPITAL_COMMUNITY): Payer: Self-pay | Admitting: Emergency Medicine

## 2018-09-05 ENCOUNTER — Other Ambulatory Visit: Payer: Self-pay

## 2018-09-05 DIAGNOSIS — J449 Chronic obstructive pulmonary disease, unspecified: Secondary | ICD-10-CM | POA: Diagnosis not present

## 2018-09-05 DIAGNOSIS — Z79899 Other long term (current) drug therapy: Secondary | ICD-10-CM | POA: Diagnosis not present

## 2018-09-05 DIAGNOSIS — E162 Hypoglycemia, unspecified: Secondary | ICD-10-CM | POA: Insufficient documentation

## 2018-09-05 DIAGNOSIS — I129 Hypertensive chronic kidney disease with stage 1 through stage 4 chronic kidney disease, or unspecified chronic kidney disease: Secondary | ICD-10-CM | POA: Insufficient documentation

## 2018-09-05 DIAGNOSIS — R279 Unspecified lack of coordination: Secondary | ICD-10-CM | POA: Diagnosis not present

## 2018-09-05 DIAGNOSIS — G309 Alzheimer's disease, unspecified: Secondary | ICD-10-CM | POA: Insufficient documentation

## 2018-09-05 DIAGNOSIS — R531 Weakness: Secondary | ICD-10-CM | POA: Diagnosis not present

## 2018-09-05 DIAGNOSIS — Z743 Need for continuous supervision: Secondary | ICD-10-CM | POA: Diagnosis not present

## 2018-09-05 DIAGNOSIS — R410 Disorientation, unspecified: Secondary | ICD-10-CM | POA: Diagnosis not present

## 2018-09-05 DIAGNOSIS — N183 Chronic kidney disease, stage 3 (moderate): Secondary | ICD-10-CM | POA: Insufficient documentation

## 2018-09-05 DIAGNOSIS — R5383 Other fatigue: Secondary | ICD-10-CM | POA: Diagnosis not present

## 2018-09-05 DIAGNOSIS — I10 Essential (primary) hypertension: Secondary | ICD-10-CM | POA: Diagnosis not present

## 2018-09-05 DIAGNOSIS — R4182 Altered mental status, unspecified: Secondary | ICD-10-CM | POA: Diagnosis present

## 2018-09-05 DIAGNOSIS — Z7982 Long term (current) use of aspirin: Secondary | ICD-10-CM | POA: Insufficient documentation

## 2018-09-05 LAB — BASIC METABOLIC PANEL
Anion gap: 7 (ref 5–15)
BUN: 25 mg/dL — ABNORMAL HIGH (ref 8–23)
CO2: 31 mmol/L (ref 22–32)
Calcium: 9.4 mg/dL (ref 8.9–10.3)
Chloride: 104 mmol/L (ref 98–111)
Creatinine, Ser: 1.18 mg/dL — ABNORMAL HIGH (ref 0.44–1.00)
GFR calc Af Amer: 49 mL/min — ABNORMAL LOW (ref >60)
GFR calc non Af Amer: 42 mL/min — ABNORMAL LOW (ref >60)
Glucose, Bld: 81 mg/dL (ref 70–99)
Potassium: 4.2 mmol/L (ref 3.5–5.1)
Sodium: 142 mmol/L (ref 135–145)

## 2018-09-05 LAB — CBC WITH DIFFERENTIAL/PLATELET
Abs Immature Granulocytes: 0 10*3/uL (ref 0.00–0.07)
Basophils Absolute: 0 10*3/uL (ref 0.0–0.1)
Basophils Relative: 1 %
Eosinophils Absolute: 0 10*3/uL (ref 0.0–0.5)
Eosinophils Relative: 1 %
HCT: 40 % (ref 36.0–46.0)
Hemoglobin: 12.4 g/dL (ref 12.0–15.0)
Immature Granulocytes: 0 %
Lymphocytes Relative: 34 %
Lymphs Abs: 1.1 10*3/uL (ref 0.7–4.0)
MCH: 28.8 pg (ref 26.0–34.0)
MCHC: 31 g/dL (ref 30.0–36.0)
MCV: 93 fL (ref 80.0–100.0)
Monocytes Absolute: 0.5 10*3/uL (ref 0.1–1.0)
Monocytes Relative: 17 %
Neutro Abs: 1.5 10*3/uL — ABNORMAL LOW (ref 1.7–7.7)
Neutrophils Relative %: 47 %
Platelets: 165 10*3/uL (ref 150–400)
RBC: 4.3 MIL/uL (ref 3.87–5.11)
RDW: 13.2 % (ref 11.5–15.5)
WBC: 3.1 10*3/uL — ABNORMAL LOW (ref 4.0–10.5)
nRBC: 0 % (ref 0.0–0.2)

## 2018-09-05 LAB — CBG MONITORING, ED
Glucose-Capillary: 54 mg/dL — ABNORMAL LOW (ref 70–99)
Glucose-Capillary: 84 mg/dL (ref 70–99)
Glucose-Capillary: 94 mg/dL (ref 70–99)

## 2018-09-05 NOTE — ED Notes (Signed)
CBG 54, PA at bedside. Patient given orange juice.

## 2018-09-05 NOTE — ED Notes (Signed)
Patient transported to X-ray 

## 2018-09-05 NOTE — ED Provider Notes (Addendum)
[facility] EMERGENCY DEPARTMENT Provider Note   CSN: [id number] Arrival date & time: 09/05/18  1359    History   Chief Complaint Chief Complaint  Patient presents with  . Fatigue    HPI [name] [name] is a 85 y.o. female.     HPI   Level 5 caveat due to dementia.  [name] [name] is a 85 y.o. female, with a history of Alzheimer's dementia, HTN, presenting to the ED with reported change in behavior. Patient is from [facility].  She was reportedly noted to be "lethargic and just not acting like herself."  She is reportedly oriented to self only.  Patient denies any pain, recent illness, or complaints.    Past Medical History:  Diagnosis Date  . Alzheimer's dementia ([place])   . Arthritis    Osteoarthritis  . Chicken pox    as a child  . Hypertension   . Mumps    as a child  . MVP (mitral valve prolapse)     Patient Active Problem List   Diagnosis Date Noted  . High risk medication use 01/24/2018  . CKD (chronic kidney disease) stage 3, GFR 30-59 ml/min ([place]) 01/24/2018  . DDD (degenerative disc disease), lumbar 03/29/2015  . Leukopenia 03/29/2015  . Dementia without behavioral disturbance ([place]) 03/27/2015  . Alopecia 03/27/2015  . Finger infection 07/16/2013  . Loss of weight 12/03/2012  . Hypertension 07/12/2010  . Mitral valve prolapse 07/12/2010    Past Surgical History:  Procedure Laterality Date  . ABDOMINAL HYSTERECTOMY    . CATARACT EXTRACTION Right 11 2013  . EYE SURGERY    . 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 TONSILLECTOMY AND ADENOIDECTOMY       OB History   No obstetric history on file.      Home Medications    Prior to Admission medications   Medication Sig Start Date End Date Taking? Authorizing Provider  aspirin EC 81 MG tablet Take 81 mg by mouth daily.    [provider]  brimonidine (ALPHAGAN P) 0.1 % SOLN Place 1 drop 2 (two) times daily into both eyes.    [provider]   hydrochlorothiazide (HYDRODIURIL) 25 MG tablet TAKE 1 TABLET ONCE DAILY. 05/23/18   Lauree Chandler, NP  lisinopril (PRINIVIL,ZESTRIL) 20 MG tablet Take 1 tablet (20 mg total) by mouth daily. 07/03/18   Lauree Chandler, NP  potassium chloride SA (K-DUR,KLOR-CON) 20 MEQ tablet TAKE 1 TABLET ONCE DAILY. 05/23/18   Lauree Chandler, NP    Family History Family History  Problem Relation Age of Onset  . Alzheimer's disease Mother   . Cancer Father   . Diabetes Sister   . Hypertension Sister   . Hypertension Brother   . Diabetes Brother     Social History Social History   Tobacco Use  . Smoking status: Never Smoker  . Smokeless tobacco: Never Used  Substance Use Topics  . Alcohol use: Never    Frequency: Never  . Drug use: No     Allergies   Exelon [rivastigmine tartrate]   Review of Systems Review of Systems  Unable to perform ROS: Dementia     Physical Exam Updated Vital Signs BP (!) 186/87   Pulse 65   Temp 97.9 F (36.6 C) (Oral)   Resp 17   Ht 5\' 7"  (1.702 m)   Wt 58.5 kg   SpO2 100%   BMI 20.20 kg/m   Physical Exam Vitals  signs and nursing note reviewed.  Constitutional:      General: She is not in acute distress.    Appearance: She is well-developed. She is not diaphoretic.  HENT:     Head: Normocephalic and atraumatic.     Mouth/Throat:     Mouth: Mucous membranes are moist.     Pharynx: Oropharynx is clear.  Eyes:     Conjunctiva/sclera: Conjunctivae normal.  Neck:     Musculoskeletal: Neck supple.  Cardiovascular:     Rate and Rhythm: Normal rate and regular rhythm.     Pulses: Normal pulses.          Radial pulses are 2+ on the right side and 2+ on the left side.       Posterior tibial pulses are 2+ on the right side and 2+ on the left side.     Heart sounds: Normal heart sounds.     Comments: Tactile temperature in the extremities appropriate and equal bilaterally. Pulmonary:     Effort: Pulmonary effort is normal. No respiratory  distress.     Breath sounds: Normal breath sounds.  Abdominal:     Palpations: Abdomen is soft.     Tenderness: There is no abdominal tenderness. There is no guarding.  Musculoskeletal:     Right lower leg: No edema.     Left lower leg: No edema.  Lymphadenopathy:     Cervical: No cervical adenopathy.  Skin:    General: Skin is warm and dry.  Neurological:     Mental Status: She is alert.     Comments: Oriented to self. Sensation grossly intact to light touch in the extremities. No noted speech deficits. No aphasia. Patient handles oral secretions without difficulty. No noted swallowing defects.  Equal grip strength bilaterally. Strength 5/5 in the upper extremities. Strength 5/5 in the lower extremities.  Coordination intact with finger to nose testing.  Cranial nerves III-XII grossly intact.  No noted visual field deficit. No facial droop.   Psychiatric:        Mood and Affect: Mood and affect normal.        Speech: Speech normal.        Behavior: Behavior normal.      ED Treatments / Results  Labs (all labs ordered are listed, but only abnormal results are displayed) Labs Reviewed  BASIC METABOLIC PANEL - Abnormal; Notable for the following components:      Result Value   BUN 25 (*)    Creatinine, Ser 1.18 (*)    GFR calc non Af Amer 42 (*)    GFR calc Af Amer 49 (*)    All other components within normal limits  CBC WITH DIFFERENTIAL/PLATELET - Abnormal; Notable for the following components:   WBC 3.1 (*)    Neutro Abs 1.5 (*)    All other components within normal limits  CBG MONITORING, ED - Abnormal; Notable for the following components:   Glucose-Capillary 54 (*)    All other components within normal limits  URINE CULTURE  URINALYSIS, ROUTINE W REFLEX MICROSCOPIC   WBC  Date Value Ref Range Status  09/05/2018 3.1 (L) 4.0 - 10.5 K/uL Final  08/28/2018 2.5 (L) 3.8 - 10.8 Thousand/uL Final  03/15/2018 3.0 (L) 4.0 - 10.5 K/uL Final  05/23/2017 2.5 (L) 3.8  - 10.8 Thousand/uL Final   BUN  Date Value Ref Range Status  09/05/2018 25 (H) 8 - 23 mg/dL Final  08/28/2018 19 7 - 25 mg/dL Final  03/15/2018 23  8 - 23 mg/dL Final  01/23/2018 27 (H) 7 - 25 mg/dL Final  10/07/2015 29 (H) 8 - 27 mg/dL Final  03/27/2015 20 8 - 27 mg/dL Final   Creat  Date Value Ref Range Status  08/28/2018 1.20 (H) 0.60 - 0.88 mg/dL Final    Comment:    For patients >6 years of age, the reference limit for Creatinine is approximately 13% higher for people identified as African-American. .   01/23/2018 1.35 (H) 0.60 - 0.88 mg/dL Final    Comment:    For patients >71 years of age, the reference limit for Creatinine is approximately 13% higher for people identified as African-American. .   08/18/2017 1.27 (H) 0.60 - 0.88 mg/dL Final    Comment:    For patients >17 years of age, the reference limit for Creatinine is approximately 13% higher for people identified as African-American. .   05/23/2017 1.34 (H) 0.60 - 0.88 mg/dL Final    Comment:    For patients >18 years of age, the reference limit for Creatinine is approximately 13% higher for people identified as African-American. .    Creatinine, Ser  Date Value Ref Range Status  09/05/2018 1.18 (H) 0.44 - 1.00 mg/dL Final  03/15/2018 1.20 (H) 0.44 - 1.00 mg/dL Final      EKG EKG Interpretation  Date/Time:  Wednesday Sep 05 2018 14:11:18 EDT Ventricular Rate:  60 PR Interval:    QRS Duration: 89 QT Interval:  431 QTC Calculation: 431 R Axis:   67 Text Interpretation:  Sinus rhythm Anterior infarct, old No significant change since last tracing Confirmed by Lennice Sites [phone]) on 09/05/2018 3:15:07 PM   Radiology Dg Chest 2 View  Result Date: 09/05/2018 CLINICAL DATA:  Fatigue EXAM: CHEST - 2 VIEW COMPARISON:  Chest x-ray dated 03/15/2018 FINDINGS: The heart size is stable. There are persistent findings of COPD. There is no pneumothorax. No large pleural effusion. No acute osseous  abnormality. There is diffuse osteopenia. There is likely scarring or atelectasis at the lung bases bilaterally, left worse than right. IMPRESSION: No active cardiopulmonary disease.  Stable findings as above. Electronically Signed   By: Constance Holster M.D.   On: 09/05/2018 15:04    Procedures Procedures (including critical care time)  Medications Ordered in ED Medications - No data to display   Initial Impression / Assessment and Plan / ED Course  I have reviewed the triage vital signs and the nursing notes.  Pertinent labs & imaging results that were available during my care of the patient were reviewed by me and considered in my medical decision making (see chart for details).  Clinical Course as of Sep 04 1513  Wed Sep 05, 2018  1418 Juice and snacks were ordered for the patient.  She was observed while she drank her juice to assure her ability to swallow safely.  Glucose-Capillary(!): 54 [SJ]  [address] [place]. They state patient is in independent living. She has a home health service that comes by to check on her and help her with her medications. They are no longer on site. They reported patient "seemed lethargic and not like herself."  They know of no recent illness or issues.   [SJ]    Clinical Course User Index [SJ] Jinnifer Montejano C, PA-C       Patient presents with reported change in behavior.  Her presentation here in the ED seems to be consistent with her reported baseline.  She has no evidence of neurologic deficits.  Her initial CBG here in the ED was 54.  She was given crackers and juice, which brought her glucose back into a normal range.  Chest x-ray unremarkable.  Findings on her CBC and BMP were consistent with previous values.   Findings and plan of care discussed with Duffy Bruce, MD. Dr. Ellender Hose personally evaluated and examined this patient.  End of shift patient care handoff report given to Trinidad Curet, EM resident.  Plan: UA pending.  Patient  likely to be discharged.   Vitals:   09/05/18 1406 09/05/18 1407 09/05/18 1430 09/05/18 1500  BP: (!) 186/87  (!) 183/92 (!) 187/91  Pulse: 65  69 63  Resp: 17  18 17   Temp: 97.9 F (36.6 C)     TempSrc: Oral     SpO2: 100%  99% 100%  Weight:  58.5 kg    Height:  5\' 7"  (1.702 m)       Final Clinical Impressions(s) / ED Diagnoses   Final diagnoses:  None    ED Discharge Orders    None       Layla Maw 09/05/18 1523    JoyHelane Gunther, PA-C 09/05/18 1536    Duffy Bruce, MD 09/05/18 2027

## 2018-09-05 NOTE — ED Notes (Signed)
[facility] called to order patient dinner. Upon checking with [facility] to see where patient is on the list to be transported home, patient was marked as "will call," and they were still waiting to hear back if she was actually ready to go. [facility] notified patient in fact ready to go home. Patient updated on delay.

## 2018-09-05 NOTE — ED Provider Notes (Signed)
Medical Decision Making: Care of patient assumed at [facility] with history.  See their note for further details.  Briefly, 85 y.o. female with PMH/PSH as below.  Past Medical History:  Diagnosis Date  . Alzheimer's dementia ([place])   . Arthritis    Osteoarthritis  . Chicken pox    as a child  . Hypertension   . Mumps    as a child  . MVP (mitral valve prolapse)    Past Surgical History:  Procedure Laterality Date  . ABDOMINAL HYSTERECTOMY    . CATARACT EXTRACTION Right 11 2013  . EYE SURGERY    . 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 TONSILLECTOMY AND ADENOIDECTOMY       Briefly, patient is a 85 year old female with history of Alzheimer's and HTN presents after her home health nurse was concerned she was more lethargic.  Blood sugar 54 on arrival.  No infectious concerns.  Has improved after eating and drinking.  Labs reassuring.  WBC 3.1 which is near her baseline.  Blood sugar is 81 on her BMP.  Chest x-ray unremarkable.  Current plan is as follows: Follow-up urinalysis.  Update: Patient refusing urinalysis.  On my evaluation, she is resting comfortably.  She states that she does not have any dysuria.  Patient is afebrile and I have low clinical suspicion for urinary tract infection. Labs not consistent with infectious etiology of her lethargy.  She was likely transiently hypoglycemic which has now improved.  Patient discharged back to her nursing facility.  I personally reviewed and interpreted all labs.   Radiology Studies:  Images viewed and used in my medical decision making. Formal interpretations by Radiology.       Trinidad Curet, MD 09/05/18 [place], [place], DO 09/05/18 [place]

## 2018-09-05 NOTE — ED Triage Notes (Signed)
Per [facility] pt coming from [facility], staff reports patient was lethargic when making their rounds this afternoon. Patient has dementia and baseline is only alert to self. Staff report only change in medical history was an increase in her lisinopril about 2 weeks ago.

## 2018-09-25 DIAGNOSIS — H401232 Low-tension glaucoma, bilateral, moderate stage: Secondary | ICD-10-CM | POA: Diagnosis not present

## 2018-10-09 ENCOUNTER — Other Ambulatory Visit: Payer: Medicare Other | Admitting: Licensed Clinical Social Worker

## 2018-10-09 DIAGNOSIS — Z515 Encounter for palliative care: Secondary | ICD-10-CM

## 2018-10-10 ENCOUNTER — Other Ambulatory Visit: Payer: Self-pay

## 2018-10-10 NOTE — Progress Notes (Signed)
COMMUNITY PALLIATIVE CARE SW NOTE  PATIENT NAME: Charlisa Cham DOB: 05-17-1931 MRN: [id number]  PRIMARY CARE PROVIDER: Lauree Chandler, NP  RESPONSIBLE PARTY:  Acct ID - Guarantor Home Phone Work Phone Relationship Acct Type  [id number] LASHONNA, RIEKE [phone]  Self P/F     [address], [place], [place] [address]   Due to the COVID-19 crisis, this virtual check-in visit was done via telephone from my office and it was initiated and consent given by this patient and or family.   PLAN OF CARE and INTERVENTIONS:             1. GOALS OF CARE/ ADVANCE CARE PLANNING:  Goal is for patient to remain at [facility].  She is a full code. 2. SOCIAL/EMOTIONAL/SPIRITUAL ASSESSMENT/ INTERVENTIONS:  SW conducted a virtual check-in visit with patient's niece [name].  Patient's braces were removed yesterday.  [name] feels patient's dementia is progressing.  She requires prompting to eat and [name] feels she has lost weight.  SW provided active listening and supportive counseling while she discussed caring for her mother who also has dementia. 3. PATIENT/CAREGIVER EDUCATION/ COPING:  Patient and her niece express their feelings openly. 4. PERSONAL EMERGENCY PLAN:  Per facility protocol. 5. COMMUNITY RESOURCES COORDINATION/ HEALTH CARE NAVIGATION:  Patient's meals are delivered to her room and facility staff sit with her while she eats. 6. FINANCIAL/LEGAL CONCERNS/INTERVENTIONS:  None.     SOCIAL HX:  Social History   Tobacco Use  . Smoking status: Never Smoker  . Smokeless tobacco: Never Used  Substance Use Topics  . Alcohol use: Never    Frequency: Never    CODE STATUS:  Full Code  ADVANCED DIRECTIVES: N MOST FORM COMPLETE:  N HOSPICE EDUCATION PROVIDED: N PPS:  Appetite is normal.  Patient ambulates independently. Duration of visit and documentation:  45 minutes.      [name] Shalana Jardin, LCSW

## 2018-10-25 DIAGNOSIS — H10021 Other mucopurulent conjunctivitis, right eye: Secondary | ICD-10-CM | POA: Diagnosis not present

## 2018-11-09 ENCOUNTER — Other Ambulatory Visit: Payer: Self-pay

## 2018-11-09 ENCOUNTER — Other Ambulatory Visit: Payer: Medicare Other | Admitting: Licensed Clinical Social Worker

## 2018-11-09 DIAGNOSIS — Z515 Encounter for palliative care: Secondary | ICD-10-CM

## 2018-11-09 NOTE — Progress Notes (Signed)
  COMMUNITY PALLIATIVE CARE SW NOTE  PATIENT NAME: [name] [name] DOB: Mar 15, 1932 MRN: [id number]  PRIMARY CARE PROVIDER: Lauree Chandler, NP  RESPONSIBLE PARTY:  Acct ID - Guarantor Home Phone Work Phone Relationship Acct Type  [id number] [name], [name] [phone]  Self P/F     [address] [place], [place] [address]   Due to the COVID-19 crisis, this virtual check-in visit was done via telephone from my office and it was initiated and consent given by this patient and or family.  PLAN OF CARE and INTERVENTIONS:             1. GOALS OF CARE/ ADVANCE CARE PLANNING:  Patient's goal is to remain at [facility].  Patient is a full code. 2. SOCIAL/EMOTIONAL/SPIRITUAL ASSESSMENT/ INTERVENTIONS:  SW conducted a Sales executive visit with the facility RN, [name].  She reports patient has not had any falls in the last three months.  She had a recent eye infection, which is clearing up with an antibiotic.  The facility front desk administrator, [name], stated patient continues walking around the facility and displays a bright affect.  SW are currently prohibited from entering the facility.  3. PATIENT/CAREGIVER EDUCATION/ COPING:  Patient and her niece express their feelings openly. 4. PERSONAL EMERGENCY PLAN:  Facility will contact patient's niece for direction. 5. COMMUNITY RESOURCES COORDINATION/ HEALTH CARE NAVIGATION:  Patient utilizes the facility nurse and aids to assist with bathing.  They also monitor her medications. 6. FINANCIAL/LEGAL CONCERNS/INTERVENTIONS:  None.      SOCIAL HX:  Social History   Tobacco Use  . Smoking status: Never Smoker  . Smokeless tobacco: Never Used  Substance Use Topics  . Alcohol use: Never    Frequency: Never    CODE STATUS:  Full Code ADVANCED DIRECTIVES: N MOST FORM COMPLETE:  N HOSPICE EDUCATION PROVIDED: N PPS:  Appetite is normal.  Patient walks independently. Duration of visit and documentation:  30 minutes.      [name]  [name], LCSW

## 2018-11-16 ENCOUNTER — Other Ambulatory Visit: Payer: Self-pay | Admitting: Nurse Practitioner

## 2018-11-28 ENCOUNTER — Encounter: Payer: Self-pay | Admitting: Nurse Practitioner

## 2018-11-28 ENCOUNTER — Ambulatory Visit (INDEPENDENT_AMBULATORY_CARE_PROVIDER_SITE_OTHER): Payer: Medicare Other | Admitting: Nurse Practitioner

## 2018-11-28 ENCOUNTER — Other Ambulatory Visit: Payer: Self-pay

## 2018-11-28 VITALS — BP 132/68 | HR 77 | Temp 98.1°F | Ht 67.0 in | Wt 126.0 lb

## 2018-11-28 DIAGNOSIS — F0390 Unspecified dementia without behavioral disturbance: Secondary | ICD-10-CM | POA: Diagnosis not present

## 2018-11-28 DIAGNOSIS — R634 Abnormal weight loss: Secondary | ICD-10-CM | POA: Diagnosis not present

## 2018-11-28 DIAGNOSIS — H6123 Impacted cerumen, bilateral: Secondary | ICD-10-CM

## 2018-11-28 DIAGNOSIS — I10 Essential (primary) hypertension: Secondary | ICD-10-CM | POA: Diagnosis not present

## 2018-11-28 DIAGNOSIS — N183 Chronic kidney disease, stage 3 unspecified: Secondary | ICD-10-CM

## 2018-11-28 DIAGNOSIS — M19011 Primary osteoarthritis, right shoulder: Secondary | ICD-10-CM

## 2018-11-28 DIAGNOSIS — Z23 Encounter for immunization: Secondary | ICD-10-CM | POA: Diagnosis not present

## 2018-11-28 LAB — BASIC METABOLIC PANEL WITH GFR
BUN/Creatinine Ratio: 26 (calc) — ABNORMAL HIGH (ref 6–22)
BUN: 31 mg/dL — ABNORMAL HIGH (ref 7–25)
CO2: 32 mmol/L (ref 20–32)
Calcium: 9.6 mg/dL (ref 8.6–10.4)
Chloride: 104 mmol/L (ref 98–110)
Creat: 1.21 mg/dL — ABNORMAL HIGH (ref 0.60–0.88)
GFR, Est African American: 47 mL/min/{1.73_m2} — ABNORMAL LOW (ref >=60)
GFR, Est Non African American: 41 mL/min/{1.73_m2} — ABNORMAL LOW (ref >=60)
Glucose, Bld: 66 mg/dL (ref 65–139)
Potassium: 4.2 mmol/L (ref 3.5–5.3)
Sodium: 142 mmol/L (ref 135–146)

## 2018-11-28 NOTE — Patient Instructions (Signed)
To give ensure/boost/nutritional supplement after the smallest meal of the day Continue to offer 3 meals a day  To notify if shoulder pain becomes worse.

## 2018-11-28 NOTE — Progress Notes (Addendum)
Careteam: Patient Care Team: Lauree Chandler, NP as PCP - General (Geriatric Medicine) Duffy, [name], LCSW as Social Worker (Licensed Clinical Social Worker)  Advanced Directive information Does Patient Have a Catering manager?: Yes, Type of Advance Directive: Healthcare Power of [place], Does patient want to make changes to medical advance directive?: No - Patient declined  Allergies  Allergen Reactions  . Exelon [Rivastigmine Tartrate] Other (See Comments)    Dizziness, stomach aches, confusion and paranoid     Chief Complaint  Patient presents with  . Medical Management of Chronic Issues    3 month follow-up. Here with caregiver   . Immunizations    Flu Vaccine, caregiver will as neice   . Shoulder Problem    Right shoulder pain      HPI: Patient is a 85 y.o. female seen in the office today for routine follow up.   Dementia- lives in independent living- Here with caregiver (friend of the family) who stays with her during the day, also has help from  Nurses can and they sitting with her during breakfast, lunch and dinner and make sure she gets her medication.   Weight loss- got braces off and now eating better- only uses supplements when she is not eating well.   Hypertension-continues on lisinopril with hCTz and potassium supplement  Slight swelling to right ankle.  Had some pain in shoulder but now has resolved.   Got flu shot today Review of Systems:  Review of Systems  Constitutional: Negative for chills, fever and weight loss.  HENT: Negative for tinnitus.   Respiratory: Negative for cough, sputum production and shortness of breath.   Cardiovascular: Negative for chest pain, palpitations and leg swelling.  Gastrointestinal: Negative for abdominal pain, constipation, diarrhea and heartburn.  Genitourinary: Negative for dysuria, frequency and urgency.  Musculoskeletal: Positive for joint pain (right shoulder, has improved). Negative for back pain,  falls and myalgias.  Skin: Negative.   Neurological: Negative for dizziness and headaches.  Psychiatric/Behavioral: Positive for memory loss. Negative for depression. The patient does not have insomnia.     Past Medical History:  Diagnosis Date  . Alzheimer's dementia ([place])   . Arthritis    Osteoarthritis  . Chicken pox    as a child  . Hypertension   . Mumps    as a child  . MVP (mitral valve prolapse)    Past Surgical History:  Procedure Laterality Date  . ABDOMINAL HYSTERECTOMY    . CATARACT EXTRACTION Right 11 2013  . EYE SURGERY    . 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 TONSILLECTOMY AND ADENOIDECTOMY     Social History:   reports that she has never smoked. She has never used smokeless tobacco. She reports that she does not drink alcohol or use drugs.  Family History  Problem Relation Age of Onset  . Alzheimer's disease Mother   . Cancer Father   . Diabetes Sister   . Hypertension Sister   . Hypertension Brother   . Diabetes Brother     Medications: Patient's Medications  New Prescriptions   No medications on file  Previous Medications   ASPIRIN EC 81 MG TABLET    Take 81 mg by mouth daily.   BRIMONIDINE (ALPHAGAN P) 0.1 % SOLN    Place 1 drop 2 (two) times daily into both eyes.   HYDROCHLOROTHIAZIDE (HYDRODIURIL) 25 MG TABLET    TAKE 1 TABLET ONCE DAILY.   LISINOPRIL (PRINIVIL,ZESTRIL) 20  MG TABLET    Take 1 tablet (20 mg total) by mouth daily.   POTASSIUM CHLORIDE SA (K-DUR) 20 MEQ TABLET    TAKE 1 TABLET ONCE DAILY.  Modified Medications   No medications on file  Discontinued Medications   No medications on file    Physical Exam:  Vitals:   11/28/18 1102  BP: 132/68  Pulse: 77  Temp: 98.1 F (36.7 C)  TempSrc: Oral  SpO2: 93%  Weight: 126 lb (57.2 kg)  Height: _0  (1.702 m)   Body mass index is 19.73 kg/m. Wt Readings from Last 3 Encounters:  11/28/18 126 lb (57.2 kg)  09/05/18 129 lb (58.5 kg)  08/28/18 129 lb (58.5 kg)     Physical Exam Constitutional:      Appearance: Normal appearance. She is well-developed.  HENT:     Mouth/Throat:     Mouth: Mucous membranes are moist.  Eyes:     General: No scleral icterus.    Pupils: Pupils are equal, round, and reactive to light.  Neck:     Musculoskeletal: Neck supple.     Thyroid: No thyromegaly.     Vascular: No carotid bruit.     Trachea: No tracheal deviation.  Cardiovascular:     Rate and Rhythm: Normal rate and regular rhythm.     Heart sounds: Normal heart sounds. No murmur. No friction rub. No gallop.      Comments: No LE edema b/l. no calf TTP.  Pulmonary:     Effort: Pulmonary effort is normal. No respiratory distress.     Breath sounds: Normal breath sounds. No stridor. No wheezing or rales.  Abdominal:     General: Bowel sounds are normal. There is no distension.     Palpations: Abdomen is soft. Abdomen is not rigid. There is no hepatomegaly or mass.     Tenderness: There is no abdominal tenderness. There is no guarding or rebound.     Hernia: No hernia is present.  Musculoskeletal:     Right shoulder: She exhibits normal range of motion, no tenderness and no swelling.     Right lower leg: Edema (trace) present.     Left lower leg: No edema.  Lymphadenopathy:     Cervical: No cervical adenopathy.  Skin:    General: Skin is warm and dry.     Findings: No rash.  Neurological:     Mental Status: She is alert.     Deep Tendon Reflexes: Reflexes are normal and symmetric.  Psychiatric:        Behavior: Behavior normal.        Thought Content: Thought content normal.     Labs reviewed: Basic Metabolic Panel: Recent Labs    03/15/18 2120 08/28/18 1116 09/05/18 1421  NA 141 143 142  K 3.6 4.1 4.2  CL 104 104 104  CO2 27 35* 31  GLUCOSE 103* 58* 81  BUN 23 19 25*  CREATININE 1.20* 1.20* 1.18*  CALCIUM 9.3 9.5 9.4   Liver Function Tests: Recent Labs    01/23/18 1410 08/28/18 1116  AST 23 21  ALT 17 13  BILITOT 0.4 0.5  PROT  7.5 7.2   No results for input(s): LIPASE, AMYLASE in the last 8760 hours. No results for input(s): AMMONIA in the last 8760 hours. CBC: Recent Labs    03/15/18 2120 08/28/18 1116 09/05/18 1421  WBC 3.0* 2.5* 3.1*  NEUTROABS  --  1,323* 1.5*  HGB 11.6* 12.9 12.4  HCT 38.3 40.4  40.0  MCV 91.4 88.4 93.0  PLT 173 186 165   Lipid Panel: No results for input(s): CHOL, HDL, LDLCALC, TRIG, CHOLHDL, LDLDIRECT in the last 8760 hours. TSH: No results for input(s): TSH in the last 8760 hours. A1C: No results found for: HGBA1C   Assessment/Plan 1. Need for immunization against influenza - Flu Vaccine QUAD High Dose(Fluad)  2. Essential hypertension Stable, will continue current regimen. - BMP with eGFR([facility])  3. Dementia without behavioral disturbance, unspecified dementia type ([place]) -progressive decline, has increase in caregivers to assist her during the day. Weight loss noted which is expected to worsen with disease progression.   4. CKD (chronic kidney disease) stage 3, GFR 30-59 ml/min ([facility]) -Encourage proper hydration and to avoid NSAIDS (Aleve, Advil, Motrin, Ibuprofen)  - BMP with eGFR([facility])  5. Loss of weight Ongoing, reports increase in intake recently, she has had more assistance with meals and caregivers in the home. Encouraged to use protein supplement daily after smallest meal.   6. Bilateral impacted cerumen -bilateral ear lavage with good results of wax removal, pt tolerated well.  7. Primary osteoarthritis of right shoulder Improved at this time, full ROM without significant discomfort, to notify if pain, redness or swelling occurs.  Next appt: 3 months for routine follow up, sooner if needed Sharol Croghan K. [place], [facility] [phone]

## 2018-11-29 NOTE — Addendum Note (Signed)
Addended by: Logan Bores on: 11/29/2018 08:36 AM   Modules accepted: Orders

## 2018-12-10 ENCOUNTER — Other Ambulatory Visit: Payer: Medicare Other | Admitting: Licensed Clinical Social Worker

## 2018-12-10 ENCOUNTER — Other Ambulatory Visit: Payer: Self-pay

## 2018-12-10 DIAGNOSIS — Z515 Encounter for palliative care: Secondary | ICD-10-CM

## 2018-12-11 NOTE — Progress Notes (Signed)
COMMUNITY PALLIATIVE CARE SW NOTE  PATIENT NAME: [name] [name] DOB: [birth year] MRN: [id number]  PRIMARY CARE PROVIDER: Lauree Chandler, NP  RESPONSIBLE PARTY:  Acct ID - Guarantor Home Phone Work Phone Relationship Acct Type  [id number] [name] [name], [name] [name] [phone]  Self P/F     [address], [place], [place] [address]   Due to the COVID-19 crisis, this virtual check-in visit was done via telephone from my office and it was initiated and consent given by this patient and or family.  PLAN OF CARE and INTERVENTIONS:             1. GOALS OF CARE/ ADVANCE CARE PLANNING:  Goal is for patient to remain at [facility].  She is a full code. 2. SOCIAL/EMOTIONAL/SPIRITUAL ASSESSMENT/ INTERVENTIONS:  SW conducted a Sales executive visit with the facility RN, [name] [name].  She stated patient's cognitive ability is declining.  Patient is not playing the piano as often.  She is up at night and sleeping later in the day.  She has been pulling clothes and shoes out of her closet at night and putting them in the hallway.  She does not have pain issues at this time. 3. PATIENT/CAREGIVER EDUCATION/ COPING:  Patient copes by expressing herself openly. 4. PERSONAL EMERGENCY PLAN:  The facility will contact patient's niece for direction. 5. COMMUNITY RESOURCES COORDINATION/ HEALTH CARE NAVIGATION:  The facility aids assist with bathing and remain with her at meals to help encourage her to eat. 6. FINANCIAL/LEGAL CONCERNS/INTERVENTIONS:  None.     SOCIAL HX:  Social History   Tobacco Use  . Smoking status: Never Smoker  . Smokeless tobacco: Never Used  Substance Use Topics  . Alcohol use: Never    Frequency: Never    CODE STATUS:  Full Code  ADVANCED DIRECTIVES: N MOST FORM COMPLETE:  N HOSPICE EDUCATION PROVIDED: N PPS:  Her appetite is normal.  She walks independently. Duration of visit and documentation:  30 minutes.      [name] Corn Aubreigh Fuerte, [name] [name]

## 2019-01-09 ENCOUNTER — Telehealth: Payer: Self-pay | Admitting: Licensed Clinical Social Worker

## 2019-01-09 ENCOUNTER — Other Ambulatory Visit: Payer: Self-pay

## 2019-01-09 ENCOUNTER — Other Ambulatory Visit: Payer: Medicare Other | Admitting: Licensed Clinical Social Worker

## 2019-01-09 DIAGNOSIS — Z515 Encounter for palliative care: Secondary | ICD-10-CM

## 2019-01-09 NOTE — Progress Notes (Signed)
COMMUNITY PALLIATIVE CARE SW NOTE  PATIENT NAME: [name] [name] DOB: 1931-04-30 MRN: [id number]  PRIMARY CARE PROVIDER: Lauree Chandler, NP  RESPONSIBLE PARTY:  Acct ID - Guarantor Home Phone Work Phone Relationship Acct Type  [id number] TALAN, CARTWRIGHT [phone]  Self P/F     [address] [place], [place] [address]   Due to the COVID-19 crisis, this virtual check-in visit was done via telephone from my office and it was initiated and consent given by this patient and or family.  PLAN OF CARE and INTERVENTIONS:             1. GOALS OF CARE/ ADVANCE CARE PLANNING:  Patient's goal is to remain in her home.  Patient is a full code. 2. SOCIAL/EMOTIONAL/SPIRITUAL ASSESSMENT/ INTERVENTIONS:  SW conducted a Sales executive visit with the facility RN, [employer].  The facility staff continues to visit with patient frequently.  Izora Gala reported that patient was sporadically crying, but they have lessened recently.  Patient is playing her piano in her apartment again.  Patient continues to have a pleasant disposition and will engage with other residents.  She has had no recent falls per staff.  Izora Gala will coordinate with the lead CNA in order for the SW to meet with patient outside of the facility. 3. PATIENT/CAREGIVER EDUCATION/ COPING: Patient expresses her feelings openly.  4. PERSONAL EMERGENCY PLAN:  The facility calls patient's niece. 5. COMMUNITY RESOURCES COORDINATION/ HEALTH CARE NAVIGATION:  Patient requires assistance with bathing and cueing for meals. 6. FINANCIAL/LEGAL CONCERNS/INTERVENTIONS:  None.     SOCIAL HX:  Social History   Tobacco Use  . Smoking status: Never Smoker  . Smokeless tobacco: Never Used  Substance Use Topics  . Alcohol use: Never    Frequency: Never    CODE STATUS:  Full Code  ADVANCED DIRECTIVES: N MOST FORM COMPLETE:  N HOSPICE EDUCATION PROVIDED:  N PPS:  Patient's appetite is normal.  She ambulates independently. Duration of visit and documentation:  30  minutes.    [name] Skeeter Sheard, LCSW

## 2019-01-09 NOTE — Telephone Encounter (Signed)
Palliative Care SW left a vm for patient's niece, [name], and the facility RN, Izora Gala, to schedule a visit.

## 2019-01-30 ENCOUNTER — Other Ambulatory Visit: Payer: Medicare Other | Admitting: Licensed Clinical Social Worker

## 2019-02-02 NOTE — Progress Notes (Signed)
COMMUNITY PALLIATIVE CARE SW NOTE  PATIENT NAME: [name] [name] DOB: 1931-08-11 MRN: [id number]  PRIMARY CARE PROVIDER: Lauree Chandler, NP  RESPONSIBLE PARTY:  Acct ID - Guarantor Home Phone Work Phone Relationship Acct Type  [id number] [name] [name], [name] [name] [phone]  Self P/F     [address] [place], [place] [address]     PLAN OF CARE and INTERVENTIONS:             1. GOALS OF CARE/ ADVANCE CARE PLANNING:  Goal is for patient to remain at [facility].  She is a full code. 2. SOCIAL/EMOTIONAL/SPIRITUAL ASSESSMENT/ INTERVENTIONS:  SW met with patient in her apartment.  She did not recognize SW from previous visits.  She stated she had just finished exercising with the other residents.  She denied pain.  She engaged in conversation, but displayed memory issues.  She displays a bright affect and welcomed the visit.  She continues to require cueing for meal intake. 3. PATIENT/CAREGIVER EDUCATION/ COPING:  Patient copes by engaging in pleasantries. 4. PERSONAL EMERGENCY PLAN:  The facility will contact patient's niece. 5. COMMUNITY RESOURCES COORDINATION/ HEALTH CARE NAVIGATION:  Facility staff assists patient with bathing and meals. 6. FINANCIAL/LEGAL CONCERNS/INTERVENTIONS:  None.     SOCIAL HX:  Social History   Tobacco Use  . Smoking status: Never Smoker  . Smokeless tobacco: Never Used  Substance Use Topics  . Alcohol use: Never    Frequency: Never    CODE STATUS:   Full Code ADVANCED DIRECTIVES: N MOST FORM COMPLETE: N HOSPICE EDUCATION PROVIDED: N PPS:  Patient's appetite is normal.  Patient ambulates independently. Duration of visit and documentation:  45 minutes.      [name] Corn Jacia Sickman, LCSW

## 2019-02-14 ENCOUNTER — Other Ambulatory Visit: Payer: Self-pay | Admitting: Nurse Practitioner

## 2019-02-26 ENCOUNTER — Other Ambulatory Visit: Payer: Self-pay

## 2019-02-26 ENCOUNTER — Other Ambulatory Visit: Payer: Medicare Other | Admitting: Licensed Clinical Social Worker

## 2019-02-26 DIAGNOSIS — Z515 Encounter for palliative care: Secondary | ICD-10-CM

## 2019-02-26 NOTE — Progress Notes (Signed)
COMMUNITY PALLIATIVE CARE SW NOTE  PATIENT NAME: [name] [name] DOB: 24-Jan-1932 MRN: [id number]  PRIMARY CARE PROVIDER: Lauree Chandler, NP  RESPONSIBLE PARTY:  Acct ID - Guarantor Home Phone Work Phone Relationship Acct Type  [id number] [name], [name] [phone]  Self P/F     [address] [place], [place] [address]     PLAN OF CARE and INTERVENTIONS:             1. GOALS OF CARE/ ADVANCE CARE PLANNING:  Niece's goal is for patient to remain at [facility].  Patient is a full code. 2. SOCIAL/EMOTIONAL/SPIRITUAL ASSESSMENT/ INTERVENTIONS:  SW met with patient in her apartment at [facility].  Patient was playing the piano and continues to recall scales.  She welcomed the visit and answered questions.  She displayed some confusion when speaking about her home and where she is supposed to live.  She had no concerns and said she is eating and sleeping adequately.  Facility staff, [name], reports patient continues to participate in facility activities. 3. PATIENT/CAREGIVER EDUCATION/ COPING:  Patient copes by expressing her feelings openly. 4. PERSONAL EMERGENCY PLAN:  Patient's niece is contacted. 5. COMMUNITY RESOURCES COORDINATION/ HEALTH CARE NAVIGATION:  Patient receives assistance from facility staff with meals and bathing. 6. FINANCIAL/LEGAL CONCERNS/INTERVENTIONS:  None.     SOCIAL HX:  Social History   Tobacco Use  . Smoking status: Never Smoker  . Smokeless tobacco: Never Used  Substance Use Topics  . Alcohol use: Never    Frequency: Never    CODE STATUS:  Full Code ADVANCED DIRECTIVES: N MOST FORM COMPLETE: N  HOSPICE EDUCATION PROVIDED: N  PPS:  Appetite is normal.  Independently ambulates. Duration of visit and documentation:  45 minutes.      [name] Durene Dodge, LCSW

## 2019-03-26 ENCOUNTER — Other Ambulatory Visit: Payer: Self-pay

## 2019-03-26 ENCOUNTER — Other Ambulatory Visit: Payer: Medicare Other | Admitting: *Deleted

## 2019-03-26 DIAGNOSIS — Z515 Encounter for palliative care: Secondary | ICD-10-CM

## 2019-03-26 NOTE — Progress Notes (Signed)
COMMUNITY PALLIATIVE CARE RN NOTE  PATIENT NAME: [name] [name] DOB: 1931/06/17 MRN: [id number]  PRIMARY CARE PROVIDER: Lauree Chandler, NP  RESPONSIBLE PARTY:  Acct ID - Guarantor Home Phone Work Phone Relationship Acct Type  [id number] DASHONNA, CHAGNON [phone]  Self P/F     [address], [place], [place] [address]   Covid-19 Pre-screening Negative  PLAN OF CARE and INTERVENTION:  1. ADVANCE CARE PLANNING/GOALS OF CARE: Goal is for patient to remain at [facility]. She is a Full code. 2. PATIENT/CAREGIVER EDUCATION: N/A 3. DISEASE STATUS: Met with patient in the lobby area at the facility. Upon arrival, she is sitting up in a chair taking a nap. She is easily aroused with verbal stimulation. She denies pain. No dyspnea or distress noted. She is very pleasant and communicative. Her conversation is repetitive in nature and she becomes fixated on one topic d/t her dementia. She appears well and looks healthy. She continues with a CNA to assists with picking out clothes in order to make sure she does not wear the same outfit for several days as she used to do in the past. She is able to perform ADLs independently, but needs reminders and prompting. She is ambulatory and states that she continues to walk daily within the facility for exercise. No recent falls. She enjoys participation in facility activities. She says that she continues to play her piano, but not as much as she used to. Her intake is good. She eats her meals in the facility dining room. She is glad to have her braces off.  She is continent of both bowel and bladder. She denies any issues or concerns at this time. Will continue to monitor.    HISTORY OF PRESENT ILLNESS: This is a 85 yo female who resides at [name] [name]. Palliative care team continues to follow patient. Will continue to visit monthly and PRN.   CODE STATUS: Full code  ADVANCED DIRECTIVES: N MOST FORM: no PPS: 50%  PHYSICAL EXAM:   LUNGS: clear to  auscultation  CARDIAC: Cor RRR EXTREMITIES: No edema SKIN: Skin color, texture, turgor normal. No rashes or lesions  NEURO: Alert and oriented to person, intermittent confusion/forgetfulness, ambulatory   (Duration of visit and documentation 45 minutes)   Daryl Eastern, RN BSN

## 2019-04-11 ENCOUNTER — Emergency Department (HOSPITAL_COMMUNITY)
Admission: EM | Admit: 2019-04-11 | Discharge: 2019-04-11 | Disposition: A | Discharge status: Home / Self Care | Payer: Medicare Other | Attending: Emergency Medicine | Admitting: Emergency Medicine

## 2019-04-11 ENCOUNTER — Other Ambulatory Visit: Payer: Self-pay

## 2019-04-11 ENCOUNTER — Emergency Department (HOSPITAL_COMMUNITY): Payer: Medicare Other

## 2019-04-11 DIAGNOSIS — G309 Alzheimer's disease, unspecified: Secondary | ICD-10-CM | POA: Insufficient documentation

## 2019-04-11 DIAGNOSIS — I129 Hypertensive chronic kidney disease with stage 1 through stage 4 chronic kidney disease, or unspecified chronic kidney disease: Secondary | ICD-10-CM | POA: Diagnosis not present

## 2019-04-11 DIAGNOSIS — I159 Secondary hypertension, unspecified: Secondary | ICD-10-CM | POA: Diagnosis not present

## 2019-04-11 DIAGNOSIS — R0789 Other chest pain: Secondary | ICD-10-CM | POA: Insufficient documentation

## 2019-04-11 DIAGNOSIS — N183 Chronic kidney disease, stage 3 unspecified: Secondary | ICD-10-CM | POA: Insufficient documentation

## 2019-04-11 DIAGNOSIS — Z7982 Long term (current) use of aspirin: Secondary | ICD-10-CM | POA: Insufficient documentation

## 2019-04-11 DIAGNOSIS — Z79899 Other long term (current) drug therapy: Secondary | ICD-10-CM | POA: Diagnosis not present

## 2019-04-11 DIAGNOSIS — R279 Unspecified lack of coordination: Secondary | ICD-10-CM | POA: Diagnosis not present

## 2019-04-11 DIAGNOSIS — I10 Essential (primary) hypertension: Secondary | ICD-10-CM | POA: Diagnosis not present

## 2019-04-11 DIAGNOSIS — R079 Chest pain, unspecified: Secondary | ICD-10-CM

## 2019-04-11 DIAGNOSIS — Z743 Need for continuous supervision: Secondary | ICD-10-CM | POA: Diagnosis not present

## 2019-04-11 LAB — BASIC METABOLIC PANEL
Anion gap: 15 (ref 5–15)
BUN: 22 mg/dL (ref 8–23)
CO2: 22 mmol/L (ref 22–32)
Calcium: 9.2 mg/dL (ref 8.9–10.3)
Chloride: 106 mmol/L (ref 98–111)
Creatinine, Ser: 1.05 mg/dL — ABNORMAL HIGH (ref 0.44–1.00)
GFR calc Af Amer: 56 mL/min — ABNORMAL LOW (ref >60)
GFR calc non Af Amer: 48 mL/min — ABNORMAL LOW (ref >60)
Glucose, Bld: 66 mg/dL — ABNORMAL LOW (ref 70–99)
Potassium: 4.1 mmol/L (ref 3.5–5.1)
Sodium: 143 mmol/L (ref 135–145)

## 2019-04-11 LAB — CBC
HCT: 43.3 % (ref 36.0–46.0)
Hemoglobin: 13 g/dL (ref 12.0–15.0)
MCH: 28.9 pg (ref 26.0–34.0)
MCHC: 30 g/dL (ref 30.0–36.0)
MCV: 96.2 fL (ref 80.0–100.0)
Platelets: 178 10*3/uL (ref 150–400)
RBC: 4.5 MIL/uL (ref 3.87–5.11)
RDW: 12.6 % (ref 11.5–15.5)
WBC: 3.4 10*3/uL — ABNORMAL LOW (ref 4.0–10.5)
nRBC: 0 % (ref 0.0–0.2)

## 2019-04-11 LAB — TROPONIN I (HIGH SENSITIVITY)
Troponin I (High Sensitivity): 6 ng/L (ref <18)
Troponin I (High Sensitivity): 9 ng/L (ref <18)

## 2019-04-11 MED ORDER — LISINOPRIL 20 MG PO TABS
20.0000 mg | ORAL_TABLET | Freq: Once | ORAL | Status: AC
  Administered 2019-04-11: 20 mg via ORAL
  Filled 2019-04-11: qty 1

## 2019-04-11 MED ORDER — SODIUM CHLORIDE 0.9% FLUSH: 3.0000 mL | Freq: Once | INTRAVENOUS | Status: DC

## 2019-04-11 NOTE — ED Notes (Addendum)
Trauma Response Nurse: Kriste Basque [phone]  Primary RN: Bevely Palmer  Interventions: Assisted the patient to the bathroom, changed soiled clothing. Continuously reoriented the patient that she was in the hospital. Provided patient with a recliner for comfort, warm blankets and hot cocoa.   Complications: Dementia, wondering in the halls and has attempted to leave, combative at times. Primary nurse has contacted the facility.  Plan of Care: Pt will be discharged with [facility]

## 2019-04-11 NOTE — Discharge Instructions (Addendum)
You were evaluated in the Emergency Department and after careful evaluation, we did not find any emergent condition requiring admission or further testing in the hospital. ° °Your exam/testing today was overall reassuring. ° °Please return to the Emergency Department if you experience any worsening of your condition.  We encourage you to follow up with a primary care provider.  Thank you for allowing us to be a part of your care. ° °

## 2019-04-11 NOTE — ED Provider Notes (Signed)
  Provider Note MRN:  [id number]  Arrival date & time: 04/11/19    ED Course and Medical Decision Making  Assumed care from Dr. Tomi Bamberger at shift change.  Chest pain, favored noncardiac, awaiting second troponin, anticipating discharge.  5:07 PM update: Second troponin is also reassuring, patient is appropriate for discharge.  Procedures  Final Clinical Impressions(s) / ED Diagnoses     ICD-10-CM   1. Secondary hypertension  I15.9   2. Chest pain, unspecified type  R07.9     ED Discharge Orders    None        Discharge Instructions     You were evaluated in the Emergency Department and after careful evaluation, we did not find any emergent condition requiring admission or further testing in the hospital.  Your exam/testing today was overall reassuring.  Please return to the Emergency Department if you experience any worsening of your condition.  We encourage you to follow up with a primary care provider.  Thank you for allowing [place] to be a part of your care.      Barth Kirks. Sedonia Small, [place] [email or web address]    Maudie Flakes, MD 04/11/19 [phone]

## 2019-04-11 NOTE — ED Notes (Signed)
Pt denies 2nd trop blood draw.

## 2019-04-11 NOTE — ED Notes (Signed)
[facility] as been called for transport. Pt continues to try and leave the hospital.

## 2019-04-11 NOTE — ED Provider Notes (Signed)
[facility] EMERGENCY DEPARTMENT Provider Note   CSN: [id number] Arrival date & time: 04/11/19  1121     History Chief Complaint  Patient presents with  . Chest Pain    [name] [name] is a 85 y.o. female.  HPI   Patient presents to the emergency room for evaluation of chest pain.  Patient was doing exercises in the chair today when she started to pain in her chest.  Patient states it was a burning pain in the center of her chest.  It did not radiate.  It lasted for about 30 minutes.  EMS was called and she was brought to the ED for evaluation.  Patient states she is not having any pain currently.  She denies any fevers or coughing.  No vomiting or diarrhea.  No shortness of breath.  Past Medical History:  Diagnosis Date  . Alzheimer's dementia ([facility])   . Arthritis    Osteoarthritis  . Chicken pox    as a child  . Hypertension   . Mumps    as a child  . MVP (mitral valve prolapse)     Patient Active Problem List   Diagnosis Date Noted  . High risk medication use 01/24/2018  . CKD (chronic kidney disease) stage 3, GFR 30-59 ml/min 01/24/2018  . DDD (degenerative disc disease), lumbar 03/29/2015  . Leukopenia 03/29/2015  . Dementia without behavioral disturbance ([facility]) 03/27/2015  . Alopecia 03/27/2015  . Loss of weight 12/03/2012  . Hypertension 07/12/2010  . Mitral valve prolapse 07/12/2010    Past Surgical History:  Procedure Laterality Date  . ABDOMINAL HYSTERECTOMY    . CATARACT EXTRACTION Right 11 2013  . EYE SURGERY    . 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 TONSILLECTOMY AND ADENOIDECTOMY       OB History   No obstetric history on file.     Family History  Problem Relation Age of Onset  . Alzheimer's disease Mother   . Cancer Father   . Diabetes Sister   . Hypertension Sister   . Hypertension Brother   . Diabetes Brother     Social History   Tobacco Use  . Smoking status: Never Smoker  . Smokeless tobacco: Never Used    Substance Use Topics  . Alcohol use: Never  . Drug use: No    Home Medications Prior to Admission medications   Medication Sig Start Date End Date Taking? Authorizing Provider  aspirin EC 81 MG tablet Take 81 mg by mouth daily.    [provider]  brimonidine (ALPHAGAN P) 0.1 % SOLN Place 1 drop 2 (two) times daily into both eyes.    [provider]  hydrochlorothiazide (HYDRODIURIL) 25 MG tablet TAKE 1 TABLET ONCE DAILY. 02/14/19   Lauree Chandler, NP  lisinopril (PRINIVIL,ZESTRIL) 20 MG tablet Take 1 tablet (20 mg total) by mouth daily. 07/03/18   Lauree Chandler, NP  potassium chloride SA (KLOR-CON) 20 MEQ tablet TAKE 1 TABLET ONCE DAILY. 02/14/19   Lauree Chandler, NP    Allergies    Exelon [rivastigmine tartrate]  Review of Systems   Review of Systems  All other systems reviewed and are negative.   Physical Exam Updated Vital Signs BP (!) 189/95   Pulse 98   Temp 98.2 F (36.8 C) (Oral)   Resp 18   SpO2 97%   Physical Exam Vitals and nursing note reviewed.  Constitutional:      Appearance: She is  well-developed. She is not ill-appearing or diaphoretic.  HENT:     Head: Normocephalic and atraumatic.     Right Ear: External ear normal.     Left Ear: External ear normal.  Eyes:     General: No scleral icterus.       Right eye: No discharge.        Left eye: No discharge.     Conjunctiva/sclera: Conjunctivae normal.  Neck:     Trachea: No tracheal deviation.  Cardiovascular:     Rate and Rhythm: Normal rate and regular rhythm.  Pulmonary:     Effort: Pulmonary effort is normal. No respiratory distress.     Breath sounds: Normal breath sounds. No stridor. No wheezing or rales.  Abdominal:     General: Bowel sounds are normal. There is no distension.     Palpations: Abdomen is soft.     Tenderness: There is no abdominal tenderness. There is no guarding or rebound.  Musculoskeletal:        General: No tenderness.     Cervical back:  Neck supple.  Skin:    General: Skin is warm and dry.     Findings: No rash.  Neurological:     Mental Status: She is alert.     Cranial Nerves: No cranial nerve deficit (no facial droop, extraocular movements intact, no slurred speech).     Sensory: No sensory deficit.     Motor: No abnormal muscle tone or seizure activity.     Coordination: Coordination normal.     ED Results / Procedures / Treatments   Labs (all labs ordered are listed, but only abnormal results are displayed) Labs Reviewed  BASIC METABOLIC PANEL - Abnormal; Notable for the following components:      Result Value   Glucose, Bld 66 (*)    Creatinine, Ser 1.05 (*)    GFR calc non Af Amer 48 (*)    GFR calc Af Amer 56 (*)    All other components within normal limits  CBC - Abnormal; Notable for the following components:   WBC 3.4 (*)    All other components within normal limits  TROPONIN I (HIGH SENSITIVITY)  TROPONIN I (HIGH SENSITIVITY)    EKG None  Radiology DG Chest 2 View  Result Date: 04/11/2019 CLINICAL DATA:  Chest pain EXAM: CHEST - 2 VIEW COMPARISON:  09/05/2018 FINDINGS: The heart size and mediastinal contours are within normal limits. Both lungs are clear. The visualized skeletal structures are unremarkable. IMPRESSION: No active cardiopulmonary disease. Electronically Signed   By: Kathreen Devoid   On: 04/11/2019 12:34    Procedures Procedures (including critical care time)  Medications Ordered in ED Medications  sodium chloride flush (NS) 0.9 % injection 3 mL (has no administration in time range)  lisinopril (ZESTRIL) tablet 20 mg (has no administration in time range)    ED Course  I have reviewed the triage vital signs and the nursing notes.  Pertinent labs & imaging results that were available during my care of the patient were reviewed by me and considered in my medical decision making (see chart for details).  Clinical Course as of Apr 11 1551  Thu Apr 11, 2019  1456 Labs  reviewed.  Initial troponin normal.   [JK]    Clinical Course User Index [JK] Dorie Rank, MD   MDM Rules/Calculators/A&P                      Pt presents with  chest pain.  Sx atypical.  EKG pending.  Intial trop normal.  Pt pain free now.  Not sure why she is here Plan on delta trop.  If negative, anticipate dc.  Final Clinical Impression(s) / ED Diagnoses Final diagnoses:  Secondary hypertension  Chest pain, unspecified type    Rx / DC Orders ED Discharge Orders    None       Dorie Rank, MD 04/11/19 [phone]

## 2019-04-11 NOTE — ED Notes (Signed)
Patient transported to X-ray 

## 2019-04-11 NOTE — ED Triage Notes (Signed)
Pt brought in by [facility] from home for chest pain following her chair exercises. Pt nurse states she witnessed pt clutching her chest and bending over in her chair. Pt currently denies any pain. Per EMS pt lethargic compared to her baseline. Pt tearful during triage. Pt has hx of dementia.

## 2019-04-11 NOTE — ED Notes (Signed)
Patient verbalizes understanding of discharge instructions. Opportunity for questioning and answers were provided. Armband removed by staff, pt discharged from ED ambulatory.   

## 2019-04-11 NOTE — ED Notes (Signed)
Pt pulling all her leads, BP cuff and pulse ox off

## 2019-04-23 ENCOUNTER — Other Ambulatory Visit: Payer: Medicare Other | Admitting: Licensed Clinical Social Worker

## 2019-04-23 ENCOUNTER — Non-Acute Institutional Stay: Payer: Medicare Other | Admitting: *Deleted

## 2019-04-23 ENCOUNTER — Other Ambulatory Visit: Payer: Self-pay

## 2019-04-23 DIAGNOSIS — Z515 Encounter for palliative care: Secondary | ICD-10-CM

## 2019-04-24 NOTE — Progress Notes (Signed)
COMMUNITY PALLIATIVE CARE SW NOTE  PATIENT NAME: Janise Gora DOB: 1932/02/21 MRN: [id number]  PRIMARY CARE PROVIDER: Lauree Chandler, NP  RESPONSIBLE PARTY:  Acct ID - Guarantor Home Phone Work Phone Relationship Acct Type  [id number] - Wedeking,Tyrena [phone]  Self P/F     [place], [place], [place] [address]     PLAN OF CARE and INTERVENTIONS:             1. GOALS OF CARE/ ADVANCE CARE PLANNING:  Goal is for patient to remain in her apartment at [facility].  She is a full code. 2. SOCIAL/EMOTIONAL/SPIRITUAL ASSESSMENT/ INTERVENTIONS:  SW and Palliative Care RN, Daryl Eastern, met with patient and her CNA, Levada Dy, at [facility].  Patient welcomed the visit.  She gave good eye contact and continues to engage in appropriate social interactions.  She did make a few comments that were not related to the context of the conversation, however.  Staff and patient did not mention her recent hospitalization to the ED.  Patient denied pain.  Facility staff must sit with her during every meal to encourage her to eat. 3. PATIENT/CAREGIVER EDUCATION/ COPING:  Niece copes by problem-solving. 4. PERSONAL EMERGENCY PLAN:  Facility will contact EMS. 5. COMMUNITY RESOURCES COORDINATION/ HEALTH CARE NAVIGATION:  She receives assistance with meals and bathing from facility staff. 6. FINANCIAL/LEGAL CONCERNS/INTERVENTIONS:  None.     SOCIAL HX:  Social History   Tobacco Use  . Smoking status: Never Smoker  . Smokeless tobacco: Never Used  Substance Use Topics  . Alcohol use: Never    CODE STATUS:  Full Code  ADVANCED DIRECTIVES: N MOST FORM COMPLETE:  N HOSPICE EDUCATION PROVIDED:  N PPS:  Patient's appetite is normal.  She ambulates independently. Duration of visit and documentation:  30 minutes.      [name] Alayah Knouff, LCSW

## 2019-04-26 NOTE — Progress Notes (Signed)
COMMUNITY PALLIATIVE CARE RN NOTE  PATIENT NAME: Byron Fuess DOB: 28-Apr-1931 MRN: [id number]  PRIMARY CARE PROVIDER: Lauree Chandler, NP  RESPONSIBLE PARTY:  Acct ID - Guarantor Home Phone Work Phone Relationship Acct Type  [id number] [name] [phone]  Self P/F     [place], [place], [place] [address]   Covid-19 Pre-screening Negative  PLAN OF CARE and INTERVENTION:  1. ADVANCE CARE PLANNING/GOALS OF CARE: Goal is for patient to remain at current facility.  2. PATIENT/CAREGIVER EDUCATION: Safe Mobility 3. DISEASE STATUS: Joint visit made with Palliative Care SW, Lynn Duffy. Upon arrival, patient is sitting in the dining room area awaiting her lunch to be served. She is accompanied by a CNA. Patient has a CNA present at all meals to prompt and encourage her to eat. She is forgetful and makes repetitive statements. She is pleasant and engaging and able to answer simple questions and make needs known. CNA also assists patient is picking out her clothes and making sure that she takes all of her medications daily. Patient remains ambulatory without assistive devices. No recent falls. She is able to shower and dress herself with prompting. She is continent of both bowel and bladder. Denies dysphagia. She likes to participate in facility activities. She loves to walk and play her piano. She denies any pain or discomfort.  She had a recent visit to the ED d/t chest pain secondary to hypertension. She was treated and released back to the facility. Will continue to monitor.   HISTORY OF PRESENT ILLNESS: This is a 86 yo female who resides at [facility]. Palliative care team continues to follow patient. Team to continue to visit patient monthly and PRN.   CODE STATUS: Full Code ADVANCED DIRECTIVES: N MOST FORM: no PPS: 50%   PHYSICAL EXAM:   LUNGS: clear to auscultation  CARDIAC: Cor RRR EXTREMITIES: No edema SKIN: Appears thinner; exposed skin is dry and intact  NEURO:  Alert and oriented x 2, pleasant mood, repetitive, ambulatory   (Duration of visit and documentation 45 minutes)   Daryl Eastern, RN BSN

## 2019-04-29 DIAGNOSIS — Z23 Encounter for immunization: Secondary | ICD-10-CM | POA: Diagnosis not present

## 2019-05-14 ENCOUNTER — Telehealth: Payer: Self-pay

## 2019-05-14 NOTE — Telephone Encounter (Signed)
Left message on voicemail for patient to return call when available . Reason for call patient is due for an AWV.  Last AWV 07/12/2017

## 2019-05-16 ENCOUNTER — Other Ambulatory Visit: Payer: Self-pay | Admitting: Nurse Practitioner

## 2019-05-16 DIAGNOSIS — I10 Essential (primary) hypertension: Secondary | ICD-10-CM

## 2019-05-20 ENCOUNTER — Other Ambulatory Visit: Payer: Medicare Other | Admitting: Licensed Clinical Social Worker

## 2019-05-20 DIAGNOSIS — Z515 Encounter for palliative care: Secondary | ICD-10-CM

## 2019-05-20 NOTE — Progress Notes (Signed)
COMMUNITY PALLIATIVE CARE SW NOTE  PATIENT NAME: [name] [name] DOB: 09/22/1931 MRN: [id number]  PRIMARY CARE PROVIDER: Lauree Chandler, NP  RESPONSIBLE PARTY:  Acct ID - Guarantor Home Phone Work Phone Relationship Acct Type  [id number] - [name],[name] [phone]  Self P/F     [place], [place],  [address]     PLAN OF CARE and INTERVENTIONS:             1. GOALS OF CARE/ ADVANCE CARE PLANNING:  Niece's goal is for patient to remain at her [facility] apartment.  Patient is a full code. 2. SOCIAL/EMOTIONAL/SPIRITUAL ASSESSMENT/ INTERVENTIONS:  SW met with patient and her CNA, Levada Dy.  Patient displayed a bright affect and asked how SW was doing.  She was minimally responsive after this and allowed SW and CNA to discuss patient's care.  Levada Dy stated patient eats 100% of her breakfast and lunch.  She eats less for dinner.  Patient continues playing her piano and participates in facility activities when appropriate. 3. PATIENT/CAREGIVER-EDUCATION/ COPING:  Niece copes by problem-solving. 4. PERSONAL EMERGENCY PLAN:  The facility will contact EMS. 5. COMMUNITY RESOURCES COORDINATION/ HEALTH CARE NAVIGATION:  She receives assistance with bathing and meals from the facility staff. 6. FINANCIAL/LEGAL CONCERNS/INTERVENTIONS:  None.     SOCIAL HX:  Social History   Tobacco Use  . Smoking status: Never Smoker  . Smokeless tobacco: Never Used  Substance Use Topics  . Alcohol use: Never    CODE STATUS:  Full Code  ADVANCED DIRECTIVES: No MOST FORM COMPLETE:  No HOSPICE EDUCATION PROVIDED:  No PPS: Her appetite is normal.  She independently ambulates. Duration of visit and documentation:  30 minutes.      [name] Bazil Dhanani, LCSW

## 2019-05-21 ENCOUNTER — Other Ambulatory Visit: Payer: Self-pay

## 2019-05-27 DIAGNOSIS — Z23 Encounter for immunization: Secondary | ICD-10-CM | POA: Diagnosis not present

## 2019-07-01 ENCOUNTER — Ambulatory Visit: Payer: Self-pay | Admitting: Nurse Practitioner

## 2019-07-03 ENCOUNTER — Encounter: Payer: Self-pay | Admitting: Nurse Practitioner

## 2019-07-03 ENCOUNTER — Other Ambulatory Visit: Payer: Self-pay

## 2019-07-03 ENCOUNTER — Ambulatory Visit (INDEPENDENT_AMBULATORY_CARE_PROVIDER_SITE_OTHER): Payer: Medicare Other | Admitting: Nurse Practitioner

## 2019-07-03 VITALS — BP 138/80 | HR 82 | Temp 97.3°F | Ht 67.0 in | Wt 136.6 lb

## 2019-07-03 VITALS — BP 138/80 | HR 82 | Temp 97.3°F | Ht 67.0 in | Wt 136.0 lb

## 2019-07-03 DIAGNOSIS — I10 Essential (primary) hypertension: Secondary | ICD-10-CM | POA: Diagnosis not present

## 2019-07-03 DIAGNOSIS — Z0000 Encounter for general adult medical examination without abnormal findings: Secondary | ICD-10-CM

## 2019-07-03 DIAGNOSIS — F0390 Unspecified dementia without behavioral disturbance: Secondary | ICD-10-CM | POA: Diagnosis not present

## 2019-07-03 DIAGNOSIS — R634 Abnormal weight loss: Secondary | ICD-10-CM

## 2019-07-03 DIAGNOSIS — N1832 Chronic kidney disease, stage 3b: Secondary | ICD-10-CM | POA: Diagnosis not present

## 2019-07-03 DIAGNOSIS — Z7189 Other specified counseling: Secondary | ICD-10-CM | POA: Diagnosis not present

## 2019-07-03 DIAGNOSIS — E2839 Other primary ovarian failure: Secondary | ICD-10-CM

## 2019-07-03 DIAGNOSIS — M5136 Other intervertebral disc degeneration, lumbar region: Secondary | ICD-10-CM

## 2019-07-03 LAB — CBC WITH DIFFERENTIAL/PLATELET
Absolute Monocytes: 428 cells/uL (ref 200–950)
Basophils Absolute: 29 cells/uL (ref 0–200)
Basophils Relative: 0.8 %
Eosinophils Absolute: 180 cells/uL (ref 15–500)
Eosinophils Relative: 5 %
HCT: 40.9 % (ref 35.0–45.0)
Hemoglobin: 13.1 g/dL (ref 11.7–15.5)
Lymphs Abs: 1004 cells/uL (ref 850–3900)
MCH: 28.7 pg (ref 27.0–33.0)
MCHC: 32 g/dL (ref 32.0–36.0)
MCV: 89.7 fL (ref 80.0–100.0)
MPV: 11.2 fL (ref 7.5–12.5)
Monocytes Relative: 11.9 %
Neutro Abs: 1958 cells/uL (ref 1500–7800)
Neutrophils Relative %: 54.4 %
Platelets: 187 10*3/uL (ref 140–400)
RBC: 4.56 10*6/uL (ref 3.80–5.10)
RDW: 11.9 % (ref 11.0–15.0)
Total Lymphocyte: 27.9 %
WBC: 3.6 10*3/uL — ABNORMAL LOW (ref 3.8–10.8)

## 2019-07-03 LAB — COMPLETE METABOLIC PANEL WITH GFR
AG Ratio: 1.2 (calc) (ref 1.0–2.5)
ALT: 12 U/L (ref 6–29)
AST: 19 U/L (ref 10–35)
Albumin: 4.2 g/dL (ref 3.6–5.1)
Alkaline phosphatase (APISO): 45 U/L (ref 37–153)
BUN/Creatinine Ratio: 19 (calc) (ref 6–22)
BUN: 26 mg/dL — ABNORMAL HIGH (ref 7–25)
CO2: 29 mmol/L (ref 20–32)
Calcium: 9.4 mg/dL (ref 8.6–10.4)
Chloride: 106 mmol/L (ref 98–110)
Creat: 1.34 mg/dL — ABNORMAL HIGH (ref 0.60–0.88)
GFR, Est African American: 42 mL/min/{1.73_m2} — ABNORMAL LOW (ref >=60)
GFR, Est Non African American: 36 mL/min/{1.73_m2} — ABNORMAL LOW (ref >=60)
Globulin: 3.4 g/dL (calc) (ref 1.9–3.7)
Glucose, Bld: 94 mg/dL (ref 65–139)
Potassium: 4 mmol/L (ref 3.5–5.3)
Sodium: 145 mmol/L (ref 135–146)
Total Bilirubin: 0.4 mg/dL (ref 0.2–1.2)
Total Protein: 7.6 g/dL (ref 6.1–8.1)

## 2019-07-03 NOTE — Progress Notes (Signed)
Careteam: Patient Care Team: Lauree Chandler, NP as PCP - General (Geriatric Medicine) Duffy, [name], LCSW as Social Worker (Licensed Holiday representative)  [place]:  [place] Directive information Does Patient Have a Catering manager?: Yes, Type of Advance Directive: Healthcare Power of Attorney, Does patient want to make changes to medical advance directive?: No - Patient declined  Allergies  Allergen Reactions  . Exelon [Rivastigmine Tartrate] Other (See Comments)    Dizziness, stomach aches, confusion and paranoid     Chief Complaint  Patient presents with  . Medical Management of Chronic Issues    7 month follow-up. Here with neice, [name]      HPI: Patient is a 86 y.o. female for routine follow up Pt with hx of dementia, htn, DDD of lumbar spine, CKD stage 3, weight loss  CKD- encouraged hydration, avoiding NSAIDS due for labs today  DDD, lumbar- stable. Does exercises routinely   Dementia-used namenda, aricept and exelon patch in the past, did not respond well to these medication. Currently in AL at [facility] with medication management.   htn- controlled on lisinopril and HCTZ  Weight loss- improved now that she is in AL, has 3 meals a day.  Review of Systems:  Review of Systems  Constitutional: Negative for chills, fever and weight loss.  HENT: Negative for tinnitus.   Respiratory: Negative for cough, sputum production and shortness of breath.   Cardiovascular: Negative for chest pain, palpitations and leg swelling.  Gastrointestinal: Negative for abdominal pain, constipation, diarrhea and heartburn.  Genitourinary: Negative for dysuria, frequency and urgency.  Musculoskeletal: Negative for back pain, falls, joint pain and myalgias.  Skin: Negative.   Neurological: Negative for dizziness and headaches.  Psychiatric/Behavioral: Positive for memory loss. Negative for depression. The patient does not have insomnia.      Past Medical History:  Diagnosis Date  . Alzheimer's dementia ([place])   . Arthritis    Osteoarthritis  . Chicken pox    as a child  . Hypertension   . Mumps    as a child  . MVP (mitral valve prolapse)    Past Surgical History:  Procedure Laterality Date  . ABDOMINAL HYSTERECTOMY    . CATARACT EXTRACTION Right 11 2013  . EYE SURGERY    . 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 TONSILLECTOMY AND ADENOIDECTOMY     Social History:   reports that she has never smoked. She has never used smokeless tobacco. She reports that she does not drink alcohol or use drugs.  Family History  Problem Relation Age of Onset  . Alzheimer's disease Mother   . Cancer Father   . Diabetes Sister   . Hypertension Sister   . Hypertension Brother   . Diabetes Brother     Medications:d Patient's Medications  New Prescriptions   No medications on file  Previous Medications   ASPIRIN EC 81 MG TABLET    Take 81 mg by mouth daily.   BRIMONIDINE (ALPHAGAN P) 0.1 % SOLN    Place 1 drop 2 (two) times daily into both eyes.   HYDROCHLOROTHIAZIDE (HYDRODIURIL) 25 MG TABLET    TAKE 1 TABLET ONCE DAILY.   LISINOPRIL (ZESTRIL) 20 MG TABLET    TAKE 1 TABLET DAILY TO CONTROL BLOOD PRESSURE.   POTASSIUM CHLORIDE SA (KLOR-CON) 20 MEQ TABLET    TAKE 1 TABLET ONCE DAILY.  Modified Medications   No medications on file  Discontinued  Medications   No medications on file    Physical Exam:  Vitals:   07/03/19 1334  BP: 138/80  Pulse: 82  Temp: (!) 97.3 F (36.3 C)  TempSrc: Temporal  SpO2: 99%  Weight: 136 lb (61.7 kg)  Height: 5\' 7"  (1.702 m)   Body mass index is 21.3 kg/m. Wt Readings from Last 3 Encounters:  07/03/19 136 lb (61.7 kg)  07/03/19 136 lb 9.6 oz (62 kg)  11/28/18 126 lb (57.2 kg)    Physical Exam Constitutional:      General: She is not in acute distress.    Appearance: She is well-developed. She is not diaphoretic.  HENT:     Head: Normocephalic and atraumatic.      Mouth/Throat:     Pharynx: No oropharyngeal exudate.  Eyes:     Conjunctiva/sclera: Conjunctivae normal.     Pupils: Pupils are equal, round, and reactive to light.  Cardiovascular:     Rate and Rhythm: Normal rate and regular rhythm.     Heart sounds: Normal heart sounds.  Pulmonary:     Effort: Pulmonary effort is normal.     Breath sounds: Normal breath sounds.  Abdominal:     General: Bowel sounds are normal.     Palpations: Abdomen is soft.  Musculoskeletal:        General: No tenderness.     Cervical back: Normal range of motion and neck supple.  Skin:    General: Skin is warm and dry.  Neurological:     Mental Status: She is alert.  Psychiatric:        Mood and Affect: Mood normal.        Behavior: Behavior normal.     Labs reviewed: Basic Metabolic Panel: Recent Labs    09/05/18 1421 11/28/18 1144 04/11/19 1138  NA 142 142 143  K 4.2 4.2 4.1  CL 104 104 106  CO2 31 32 22  GLUCOSE 81 66 66*  BUN 25* 31* 22  CREATININE 1.18* 1.21* 1.05*  CALCIUM 9.4 9.6 9.2   Liver Function Tests: Recent Labs    08/28/18 1116  AST 21  ALT 13  BILITOT 0.5  PROT 7.2   No results for input(s): LIPASE, AMYLASE in the last 8760 hours. No results for input(s): AMMONIA in the last 8760 hours. CBC: Recent Labs    08/28/18 1116 09/05/18 1421 04/11/19 1138  WBC 2.5* 3.1* 3.4*  NEUTROABS 1,323* 1.5*  --   HGB 12.9 12.4 13.0  HCT 40.4 40.0 43.3  MCV 88.4 93.0 96.2  PLT 186 165 178   Lipid Panel: No results for input(s): CHOL, HDL, LDLCALC, TRIG, CHOLHDL, LDLDIRECT in the last 8760 hours. TSH: No results for input(s): TSH in the last 8760 hours. A1C: No results found for: HGBA1C   Assessment/Plan 1. Dementia without behavioral disturbance, unspecified dementia type ([place]) -progressive decline, worse during pandemic due to isolation. -supportive care with niece and other care providers. She has medication management and assistance with ADLs. Doing very well in AL  at this time despite progressive memory loss.   2. Essential hypertension -stable on current regimen.  - COMPLETE METABOLIC PANEL WITH GFR - CBC with Differential/Platelet  3. DDD (degenerative disc disease), lumbar -stable, doing routine physical activity  4. Loss of weight -she has now moved to AL where 3 meals are provided and has gained weight   5. Stage 3b chronic kidney disease -Encourage proper hydration and to avoid NSAIDS (Aleve, Advil, Motrin, Ibuprofen)  6. Advanced directives, counseling/discussion -most form completed - DNR (Do Not Resuscitate)  Next appt: 6 months.  Carlos American. [place], [facility] [phone]

## 2019-07-03 NOTE — Progress Notes (Signed)
Subjective:   Kiyra Faurot is a 86 y.o. female who presents for Medicare Annual (Subsequent) preventive examination.  Review of Systems:   Cardiac Risk Factors include: advanced age (>56men, >86 women);hypertension     Objective:     Vitals: BP 138/80 (BP Location: Right Arm, Patient Position: Sitting, Cuff Size: Normal)   Pulse 82   Temp (!) 97.3 F (36.3 C) (Temporal)   Ht 5\' 7"  (1.702 m)   Wt 136 lb 9.6 oz (62 kg)   SpO2 99%   BMI 21.39 kg/m   Body mass index is 21.39 kg/m.  Advanced Directives 07/03/2019 07/03/2019 04/11/2019 11/28/2018 09/05/2018 08/28/2018 03/29/2018  Does Patient Have a Medical Advance Directive? Yes Yes No Yes Yes Yes No  Type of Advance Directive Healthcare Power of [place] -  Does patient want to make changes to medical advance directive? No - Patient declined No - Patient declined - No - Patient declined - No - Patient declined -  Copy of [place] in Chart? Yes - validated most recent copy scanned in chart (See row information) Yes - validated most recent copy scanned in chart (See row information) - Yes - validated most recent copy scanned in chart (See row information) - Yes - validated most recent copy scanned in chart (See row information) -  Would patient like information on creating a medical advance directive? - - No - Patient declined - - - No - Patient declined    Tobacco Social History   Tobacco Use  Smoking Status Never Smoker  Smokeless Tobacco Never Used     Counseling given: Not Answered   Clinical Intake:  Pre-visit preparation completed: Yes  Pain : No/denies pain     BMI - recorded: 21.39 Nutritional Status: BMI of 19-24  Normal Nutritional Risks: None Diabetes: No  How often do you need to have someone help you when you read instructions, pamphlets, or other written materials from  your doctor or pharmacy?: 5 - Always(cognitive impairment)  Interpreter Needed?: No     Past Medical History:  Diagnosis Date  . Alzheimer's dementia ([place])   . Arthritis    Osteoarthritis  . Chicken pox    as a child  . Hypertension   . Mumps    as a child  . MVP (mitral valve prolapse)    Past Surgical History:  Procedure Laterality Date  . ABDOMINAL HYSTERECTOMY    . CATARACT EXTRACTION Right 11 2013  . EYE SURGERY    . 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 TONSILLECTOMY AND ADENOIDECTOMY     Family History  Problem Relation Age of Onset  . Alzheimer's disease Mother   . Cancer Father   . Diabetes Sister   . Hypertension Sister   . Hypertension Brother   . Diabetes Brother    Social History   Socioeconomic History  . Marital status: Widowed    Spouse name: Not on file  . Number of children: Not on file  . Years of education: Not on file  . Highest education level: Not on file  Occupational History  . Occupation: retired Art therapist   Tobacco Use  . Smoking status: Never Smoker  . Smokeless tobacco: Never Used  Substance and Sexual Activity  . Alcohol use: Never  . Drug use: No  . Sexual activity: Never  Other Topics Concern  .  Not on file  Social History Narrative   Retired since 2004. [employer] middle school in [place] masters degree widowed.  re located to [place]         Diet:   Do you drink/eat things with caffeine? Sometimes   Marital status: Widow                          What year were you married?   Do you live in a house, apartment, assisted living, condo, trailer, etc)? Condo   Is it one or more stories? Yes   How many persons live in your home? 1   Do you have any pets in your home? 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 Yes                                                    Type & how often: walking daily   Do you have a living will?  No   Do you have a DNR Form?  No   Do you have a POA/HPOA forms? Yes      Social Determinants of Health   Financial Resource Strain:   . Difficulty of Paying Living Expenses:   Food Insecurity:   . Worried About Charity fundraiser in the Last Year:   . Arboriculturist in the Last Year:   Transportation Needs:   . Film/video editor (Medical):   Marland Kitchen Lack of Transportation (Non-Medical):   Physical Activity:   . Days of Exercise per Week:   . Minutes of Exercise per Session:   Stress:   . Feeling of Stress :   Social Connections:   . Frequency of Communication with Friends and Family:   . Frequency of Social Gatherings with Friends and Family:   . Attends Religious Services:   . Active Member of Clubs or Organizations:   . Attends Archivist Meetings:   Marland Kitchen Marital Status:     Outpatient Encounter Medications as of 07/03/2019  Medication Sig  . aspirin EC 81 MG tablet Take 81 mg by mouth daily.  . brimonidine (ALPHAGAN P) 0.1 % SOLN Place 1 drop 2 (two) times daily into both eyes.  . hydrochlorothiazide (HYDRODIURIL) 25 MG tablet TAKE 1 TABLET ONCE DAILY.  Marland Kitchen lisinopril (ZESTRIL) 20 MG tablet TAKE 1 TABLET DAILY TO CONTROL BLOOD PRESSURE.  Marland Kitchen potassium chloride SA (KLOR-CON) 20 MEQ tablet TAKE 1 TABLET ONCE DAILY.   No facility-administered encounter medications on file as of 07/03/2019.    Activities of Daily Living In your present state of health, do you have any difficulty performing the following activities: 07/03/2019  Hearing? N  Vision? N  Difficulty concentrating or making decisions? Y  Walking or climbing stairs? N  Dressing or bathing? Y  Comment assistance required, lives in [place]  Doing errands, shopping? Y  Comment neice helps  Preparing Food and eating ? Y  Comment prepared at [place]  Using the Toilet? N  In the past six months, have you accidently leaked urine? N  Do you have problems with loss of bowel control? N  Managing your Medications? Y  Comment medication management  service  Managing your Finances? Y  Comment neice  Lobbyist or managing your Housekeeping? Y  Comment lives in [place]  Some recent data might be hidden    Patient Care Team: Lauree Chandler, NP as PCP - General (Geriatric Medicine) Duffy, [name], LCSW as Social Worker (Licensed Clinical Social Worker)    Assessment:   This is a routine wellness examination for Shalinda.  Exercise Activities and Dietary recommendations Current Exercise Habits: Home exercise routine, Type of exercise: walking;stretching;calisthenics, Time (Minutes): 45, Frequency (Times/Week): 5, Weekly Exercise (Minutes/Week): 225  Goals    . Patient Stated     Slow progress of dementia as much as possible        Fall Risk Fall Risk  07/03/2019 11/28/2018 08/28/2018 03/29/2018 01/23/2018  Falls in the past year? 0 0 0 0 Yes  Number falls in past yr: 0 0 0 0 1  Comment - - - - -  Injury with Fall? 0 0 0 0 Yes  Comment - - - - October 31, 2017 Mouth injury    Is the patient's home free of loose throw rugs in walkways, pet beds, electrical cords, etc?   yes      Grab bars in the bathroom? yes      Handrails on the stairs?   yes      Adequate lighting?   yes  Timed Get Up and Go performed: na  Depression Screen PHQ 2/9 Scores 07/03/2019 08/28/2018 07/12/2017 11/08/2016  PHQ - 2 Score 0 0 0 0     Cognitive Function MMSE - Mini Mental State Exam 07/03/2019 07/12/2017 11/08/2016 05/13/2015  Not completed: - - - (No Data)  Orientation to time 1 1 2 4   Orientation to Place 0 0 4 3  Registration 3 3 3 3   Attention/ Calculation 0 5 5 5   Recall 0 0 0 0  Language- name 2 objects 1 2 2 2   Language- repeat 1 1 1 1   Language- follow 3 step command 0 3 2 3   Language- read & follow direction 0 1 1 1   Write a sentence 0 1 1 1   Copy design 0 1 1 0  Total score 6 18 22 23         Immunization History  Administered Date(s) Administered  . Fluad Quad(high Dose 65+) 11/28/2018  . Influenza,inj,Quad PF,6+  Mos 02/03/2014, 03/27/2015, 03/30/2016, 12/13/2016  . Influenza-Unspecified 01/10/2018  . Pneumococcal Conjugate-13 02/15/2017  . Pneumococcal Polysaccharide-23 12/03/2012  . Tdap 08/24/2010, 10/29/2017    Qualifies for Shingles Vaccine?yes, recommended  Screening Tests Health Maintenance  Topic Date Due  . TETANUS/TDAP  10/30/2027  . INFLUENZA VACCINE  Completed  . DEXA SCAN  Completed  . PNA vac Low Risk Adult  Completed    Cancer Screenings: Lung: Low Dose CT Chest recommended if Age 34-80 years, 30 pack-year currently smoking OR have quit w/in 15years. Patient does not qualify. Breast:  Up to date on Mammogram? Aged out Up to date of Bone Density/Dexa? No Colorectal: aged out  Additional Screenings:  Hepatitis C Screening: na     Plan:      I have personally reviewed and noted the following in the patient's chart:   . Medical and social history . Use of alcohol, tobacco or illicit drugs  . Current medications and supplements . Functional ability and status . Nutritional status . Physical activity . Advanced directives . List of other physicians . Hospitalizations, surgeries, and ER visits in previous 12 months . Vitals . Screenings to include cognitive, depression,  and falls . Referrals and appointments  In addition, I have reviewed and discussed with patient certain preventive protocols, quality metrics, and best practice recommendations. A written personalized care plan for preventive services as well as general preventive health recommendations were provided to patient.     Lauree Chandler, NP  07/03/2019

## 2019-07-03 NOTE — Patient Instructions (Signed)
[name] [name] , Thank you for taking time to come for your Medicare Wellness Visit. I appreciate your ongoing commitment to your health goals. Please review the following plan we discussed and let me know if I can assist you in the future.   Screening recommendations/referrals: Colonoscopy -aged out Mammogram -aged out Bone Density -due, order placed call [facility] for scheduling [phone] Recommended yearly ophthalmology/optometry visit for glaucoma screening and checkup Recommended yearly dental visit for hygiene and checkup  Vaccinations: Influenza vaccine up to date Pneumococcal vaccine up to date Tdap vaccine up to date Shingles vaccine RECOMMENDED- to get at your local pharmacy  Advanced directives: RECOMMENDED to complete MOST from  Conditions/risks identified: progressive cognitive and functional decline due to dementia  Next appointment: 1 year   Preventive Care 1 Years and Older, Female Preventive care refers to lifestyle choices and visits with your health care provider that can promote health and wellness. What does preventive care include?  A yearly physical exam. This is also called an annual well check.  Dental exams once or twice a year.  Routine eye exams. Ask your health care provider how often you should have your eyes checked.  Personal lifestyle choices, including:  Daily care of your teeth and gums.  Regular physical activity.  Eating a healthy diet.  Avoiding tobacco and drug use.  Limiting alcohol use.  Practicing safe sex.  Taking low-dose aspirin every day.  Taking vitamin and mineral supplements as recommended by your health care provider. What happens during an annual well check? The services and screenings done by your health care provider during your annual well check will depend on your age, overall health, lifestyle risk factors, and family history of disease. Counseling  Your health care provider may ask you  questions about your:  Alcohol use.  Tobacco use.  Drug use.  Emotional well-being.  Home and relationship well-being.  Sexual activity.  Eating habits.  History of falls.  Memory and ability to understand (cognition).  Work and work Statistician.  Reproductive health. Screening  You may have the following tests or measurements:  Height, weight, and BMI.  Blood pressure.  Lipid and cholesterol levels. These may be checked every 5 years, or more frequently if you are over 40 years old.  Skin check.  Lung cancer screening. You may have this screening every year starting at age 69 if you have a 30-pack-year history of smoking and currently smoke or have quit within the past 15 years.  Fecal occult blood test (FOBT) of the stool. You may have this test every year starting at age 19.  Flexible sigmoidoscopy or colonoscopy. You may have a sigmoidoscopy every 5 years or a colonoscopy every 10 years starting at age 47.  Hepatitis C blood test.  Hepatitis B blood test.  Sexually transmitted disease (STD) testing.  Diabetes screening. This is done by checking your blood sugar (glucose) after you have not eaten for a while (fasting). You may have this done every 1-3 years.  Bone density scan. This is done to screen for osteoporosis. You may have this done starting at age 41.  Mammogram. This may be done every 1-2 years. Talk to your health care provider about how often you should have regular mammograms. Talk with your health care provider about your test results, treatment options, and if necessary, the need for more tests. Vaccines  Your health care provider may recommend certain vaccines, such as:  Influenza vaccine. This is recommended every year.  Tetanus,  diphtheria, and acellular pertussis (Tdap, Td) vaccine. You may need a Td booster every 10 years.  Zoster vaccine. You may need this after age 32.  Pneumococcal 13-valent conjugate (PCV13) vaccine. One dose is  recommended after age 83.  Pneumococcal polysaccharide (PPSV23) vaccine. One dose is recommended after age 62. Talk to your health care provider about which screenings and vaccines you need and how often you need them. This information is not intended to replace advice given to you by your health care provider. Make sure you discuss any questions you have with your health care provider. Document Released: 04/24/2015 Document Revised: 12/16/2015 Document Reviewed: 01/27/2015 Elsevier Interactive Patient Education  2017 [place] Prevention in the Home Falls can cause injuries. They can happen to people of all ages. There are many things you can do to make your home safe and to help prevent falls. What can I do on the outside of my home?  Regularly fix the edges of walkways and driveways and fix any cracks.  Remove anything that might make you trip as you walk through a door, such as a raised step or threshold.  Trim any bushes or trees on the path to your home.  Use bright outdoor lighting.  Clear any walking paths of anything that might make someone trip, such as rocks or tools.  Regularly check to see if handrails are loose or broken. Make sure that both sides of any steps have handrails.  Any raised decks and porches should have guardrails on the edges.  Have any leaves, snow, or ice cleared regularly.  Use sand or salt on walking paths during winter.  Clean up any spills in your garage right away. This includes oil or grease spills. What can I do in the bathroom?  Use night lights.  Install grab bars by the toilet and in the tub and shower. Do not use towel bars as grab bars.  Use non-skid mats or decals in the tub or shower.  If you need to sit down in the shower, use a plastic, non-slip stool.  Keep the floor dry. Clean up any water that spills on the floor as soon as it happens.  Remove soap buildup in the tub or shower regularly.  Attach bath mats  securely with double-sided non-slip rug tape.  Do not have throw rugs and other things on the floor that can make you trip. What can I do in the bedroom?  Use night lights.  Make sure that you have a light by your bed that is easy to reach.  Do not use any sheets or blankets that are too big for your bed. They should not hang down onto the floor.  Have a firm chair that has side arms. You can use this for support while you get dressed.  Do not have throw rugs and other things on the floor that can make you trip. What can I do in the kitchen?  Clean up any spills right away.  Avoid walking on wet floors.  Keep items that you use a lot in easy-to-reach places.  If you need to reach something above you, use a strong step stool that has a grab bar.  Keep electrical cords out of the way.  Do not use floor polish or wax that makes floors slippery. If you must use wax, use non-skid floor wax.  Do not have throw rugs and other things on the floor that can make you trip. What can I do with  my stairs?  Do not leave any items on the stairs.  Make sure that there are handrails on both sides of the stairs and use them. Fix handrails that are broken or loose. Make sure that handrails are as long as the stairways.  Check any carpeting to make sure that it is firmly attached to the stairs. Fix any carpet that is loose or worn.  Avoid having throw rugs at the top or bottom of the stairs. If you do have throw rugs, attach them to the floor with carpet tape.  Make sure that you have a light switch at the top of the stairs and the bottom of the stairs. If you do not have them, ask someone to add them for you. What else can I do to help prevent falls?  Wear shoes that:  Do not have high heels.  Have rubber bottoms.  Are comfortable and fit you well.  Are closed at the toe. Do not wear sandals.  If you use a stepladder:  Make sure that it is fully opened. Do not climb a closed  stepladder.  Make sure that both sides of the stepladder are locked into place.  Ask someone to hold it for you, if possible.  Clearly mark and make sure that you can see:  Any grab bars or handrails.  First and last steps.  Where the edge of each step is.  Use tools that help you move around (mobility aids) if they are needed. These include:  Canes.  Walkers.  Scooters.  Crutches.  Turn on the lights when you go into a dark area. Replace any light bulbs as soon as they burn out.  Set up your furniture so you have a clear path. Avoid moving your furniture around.  If any of your floors are uneven, fix them.  If there are any pets around you, be aware of where they are.  Review your medicines with your doctor. Some medicines can make you feel dizzy. This can increase your chance of falling. Ask your doctor what other things that you can do to help prevent falls. This information is not intended to replace advice given to you by your health care provider. Make sure you discuss any questions you have with your health care provider. Document Released: 01/22/2009 Document Revised: 09/03/2015 Document Reviewed: 05/02/2014 Elsevier Interactive Patient Education  2017 [employer].

## 2019-07-04 DIAGNOSIS — H16223 Keratoconjunctivitis sicca, not specified as Sjogren's, bilateral: Secondary | ICD-10-CM | POA: Diagnosis not present

## 2019-07-04 DIAGNOSIS — G468 Other vascular syndromes of brain in cerebrovascular diseases: Secondary | ICD-10-CM | POA: Diagnosis not present

## 2019-07-04 DIAGNOSIS — H401232 Low-tension glaucoma, bilateral, moderate stage: Secondary | ICD-10-CM | POA: Diagnosis not present

## 2019-07-08 ENCOUNTER — Other Ambulatory Visit: Payer: Self-pay

## 2019-07-08 ENCOUNTER — Other Ambulatory Visit: Payer: Medicare Other | Admitting: *Deleted

## 2019-07-08 ENCOUNTER — Ambulatory Visit: Payer: Self-pay | Admitting: Nurse Practitioner

## 2019-07-08 ENCOUNTER — Encounter: Payer: Self-pay | Admitting: Nurse Practitioner

## 2019-07-08 DIAGNOSIS — Z515 Encounter for palliative care: Secondary | ICD-10-CM

## 2019-07-12 ENCOUNTER — Other Ambulatory Visit: Payer: Medicare Other

## 2019-07-12 DIAGNOSIS — Z515 Encounter for palliative care: Secondary | ICD-10-CM

## 2019-07-12 NOTE — Progress Notes (Signed)
COMMUNITY PALLIATIVE CARE RN NOTE  PATIENT NAME: [name] [name] DOB: 11/05/1931 MRN: [id number]  PRIMARY CARE PROVIDER: Eubanks, [name] K, NP  RESPONSIBLE PARTY:  Acct ID - Guarantor Home Phone Work Phone Relationship Acct Type  [id number] - Shimmel,Johnica [phone]  Self P/F     [address], [place], [place] [address]   Covid-19 Pre-screening Negative  PLAN OF CARE and INTERVENTION:  1. ADVANCE CARE PLANNING/GOALS OF CARE: Goal is for patient to remain in her current apartment at [facility]. She has a DNR 2. PATIENT/CAREGIVER EDUCATION: Symptom management, safe mobility 3. DISEASE STATUS: Met with patient in the lobby area of the facility. Upon arrival, patient is sitting up in a chair in front of the fireplace. She is awake and alert. She is able to answer simple questions. Conversation is repetitive in nature. She is able to follow commands, but is intermittently confused. She denies pain. Respirations are even and unlabored. She remains ambulatory without the use of assistive devices. She does require some assistance with bathing and dressing because of the need for prompting and cueing. She does have a home health aid to assist with personal care and makes sure that she takes her daily medications. She likes to spend time outside of her room and participate in facility activities. She continues to stay active and take her daily walks inside of the facility. Her intake is fair. She denies any difficulties swallowing. She is continent of both bowel and bladder. She has no questions or concerns at this time. Will continue to monitor.  HISTORY OF PRESENT ILLNESS:  This is a 86 yo female with a diagnosis of dementia with a h/o HTN, weight loss and CKD stage III. Palliative care team continues to follow patient and visits monthly and PRN.  CODE STATUS: DNR ADVANCED DIRECTIVES: Y MOST FORM: yes PPS: 50%   PHYSICAL EXAM:   LUNGS: clear to auscultation  CARDIAC: Cor RRR EXTREMITIES: No  edema SKIN: Skin color, texture, turgor normal. No rashes or lesions  NEURO: Alert and oriented to self, repetitive statements, pleasant mood, ambulatory   (Duration of visit and documentation 30 minutes)    , RN BSN 

## 2019-07-15 ENCOUNTER — Other Ambulatory Visit: Payer: Self-pay

## 2019-07-15 NOTE — Progress Notes (Signed)
COMMUNITY PALLIATIVE CARE SW NOTE  PATIENT NAME: [name] [name] DOB: Jul 21, 1931 MRN: [id number]  PRIMARY CARE PROVIDER: Lauree Chandler, NP  RESPONSIBLE PARTY:  Acct ID - Guarantor Home Phone Work Phone Relationship Acct Type  [id number] - [name],[name] [phone]  Self P/F     [place], [place], [place] [address]     PLAN OF CARE and INTERVENTIONS:             1. GOALS OF CARE/ ADVANCE CARE PLANNING:Patient is a DNR-form is posted in her apartment. Niece-[name] [name] serves as her POA/HCPOA. Goal is for patient to maintain her independence for as long as possible. 2. SOCIAL/EMOTIONAL/SPIRITUAL ASSESSMENT/ INTERVENTIONS: Palliative Care SW visited with patient in the College (common area) at the facility. She was sitting by herself, drinking tea. Patient denied that she was having any pain or discomfort. She appeared to be alert and oriented to self and place. However patient's verbalizations were scattered and disconnected as cognitive deficits seem to be profound during this visit. Patient displayed her social graces, but unable to have a meaningful conversation with SW, other than answering simple yes/no questions. Staff reported that patient's niece-[name] [name] is visiting with patient and staying in patient's apartment, however patient was not aware of this. SW met with [name] [name] in patient's apartment and patient joined the meeting shortly after. SW introduced herself as new Education officer, museum to the palliative care team that will be working with patient/PCG. [name] [name] gave social history on patient. [name] [name] advised that she has not seen patient since June of 2020, but has seen an acceleration of her dementia.. She stated that patient is unable to complete sentences and is repetitive in her verbalizations to the point where the other residents are avoiding patient because they can't understand her. [name] [name] advised that the facility has discussed moving patient to the memory care community, which she is  considering. SW and [name] [name] talked at length about the pros and cons of moving patient there, and how she is coping with patient's decline. [name] [name] agree that the  [facility] expressed regret about the isolation patient has experienced due to COVID and her inability to travel and be with patient. She feels the social isolation accelerated the progression of patient's disease. [name] [name] also expressed feelings of guilt, resentment at times, and caregiver fatigue as she is also the caregiver for her mother, patient's sister, who is also suffering from dementia. SW normalized her feelings, encouraged self-care, while commending [name] [name] on all her efforts to ensure both patient and her mother's care needs are met. Patient sat quietly, smiling as SW talked with [name] [name]. When asked a question, patient smiled nodded "yes", but made no verbalizations. 3. PATIENT/CAREGIVER EDUCATION/ COPING: SW provided education regarding memory care programs and benefits for patient to include safety, higher staff/patient ratio and patient will be with others who have cognitive deficits and she wil not experience isolation. Education provided on caregiver fatigue and the importance of self care. Patient continues to go to meals, attend activities, walk around the perimeter of the community, however [name] [name] expressed safety concerns as patient cognitive deficis persists. 4. PERSONAL EMERGENCY PLAN: 911 can be activated for emergencies. PCG-[name] [name] understand visit frequency and availability of palliative care team.  5. COMMUNITY RESOURCES COORDINATION/ HEALTH CARE NAVIGATION: Patient is currently receiving assistance with ADLs, medication management and escort to meals from [facility] 7 days a week. Patient also is scheduled to receive speech therapy in hopes that it will help patient to formulate her words better.  6. FINANCIAL/LEGAL CONCERNS/INTERVENTIONS:  No financial or legal concerns noted. Niece/PCG- is moving forward  with moving patient to memory care unit ([facility]) within the [facility] in the next 2/3 months.      SOCIAL HX:  Social History   Tobacco Use  . Smoking status: Never Smoker  . Smokeless tobacco: Never Used  Substance Use Topics  . Alcohol use: Never    CODE STATUS:   Code Status: DNR  ADVANCED DIRECTIVES: N MOST FORM COMPLETE:  No HOSPICE EDUCATION PROVIDED: Yes, hospice education provided. As patient decline cognitively, PCG-[name] [name] was interested in knowing what changes/symptoms indicate patient's eligibility for hospice.  PPS: Patient ambulates independently;Safety concerns noted as she continues to cognitively decline and has decreased orientation and safety awareness.  SOB: None noted   I spent 80 minutes with patient/PCG, assessing needs and coping, providing education about disease progression and hospice care, supportive counseling, processing and normalizing feelings, encouraging self care and reassurance of support.   [address] [place], [place]

## 2019-07-24 DIAGNOSIS — Z20828 Contact with and (suspected) exposure to other viral communicable diseases: Secondary | ICD-10-CM | POA: Diagnosis not present

## 2019-07-24 DIAGNOSIS — Z1159 Encounter for screening for other viral diseases: Secondary | ICD-10-CM | POA: Diagnosis not present

## 2019-07-31 DIAGNOSIS — Z20828 Contact with and (suspected) exposure to other viral communicable diseases: Secondary | ICD-10-CM | POA: Diagnosis not present

## 2019-07-31 DIAGNOSIS — L603 Nail dystrophy: Secondary | ICD-10-CM | POA: Diagnosis not present

## 2019-07-31 DIAGNOSIS — L84 Corns and callosities: Secondary | ICD-10-CM | POA: Diagnosis not present

## 2019-07-31 DIAGNOSIS — Z1159 Encounter for screening for other viral diseases: Secondary | ICD-10-CM | POA: Diagnosis not present

## 2019-07-31 DIAGNOSIS — I739 Peripheral vascular disease, unspecified: Secondary | ICD-10-CM | POA: Diagnosis not present

## 2019-08-07 DIAGNOSIS — Z1159 Encounter for screening for other viral diseases: Secondary | ICD-10-CM | POA: Diagnosis not present

## 2019-08-07 DIAGNOSIS — Z20828 Contact with and (suspected) exposure to other viral communicable diseases: Secondary | ICD-10-CM | POA: Diagnosis not present

## 2019-08-13 ENCOUNTER — Other Ambulatory Visit: Payer: Self-pay

## 2019-08-13 ENCOUNTER — Other Ambulatory Visit: Payer: Medicare Other

## 2019-08-13 DIAGNOSIS — Z515 Encounter for palliative care: Secondary | ICD-10-CM

## 2019-08-14 DIAGNOSIS — Z1159 Encounter for screening for other viral diseases: Secondary | ICD-10-CM | POA: Diagnosis not present

## 2019-08-14 DIAGNOSIS — Z20828 Contact with and (suspected) exposure to other viral communicable diseases: Secondary | ICD-10-CM | POA: Diagnosis not present

## 2019-08-15 NOTE — Progress Notes (Signed)
COMMUNITY PALLIATIVE CARE SW NOTE  PATIENT NAME: [name] [name] DOB: 28-Mar-1932 MRN: [id number]  PRIMARY CARE PROVIDER: Lauree Chandler, NP  RESPONSIBLE PARTY:  Acct ID - Guarantor Home Phone Work Phone Relationship Acct Type  [id number] - Aldrete,Ival [phone]  Self P/F     [place], [place], [place] [address]     PLAN OF CARE and INTERVENTIONS:             1. GOALS OF CARE/ ADVANCE CARE PLANNING: Goal is for patient to remain her independence. Patient is a DNR. 2. SOCIAL/EMOTIONAL/SPIRITUAL ASSESSMENT/ INTERVENTIONS:  SW completed a face-to-face visit with patient at the facility ([facility]). Patient was sitting outside of the exercise room, looking at a calender. She greeted SW will. She denied that she was having any pain or discomfort. She was alert and oriented to self only and was otherwise pleasantly confused. Patient repeated herself, making the same statement several times. She could respond to simple yes/no questions.She was appropriately dressed. She eventually went in and participated in the group exercise class. SW consulted with the staff at the facility regarding patient status. No significant changes noted. Patient remains confused and is shunned by some of peers as her cognitive deficits are so profound. There are still talks with the niece regarding moving patient to the memory care community. SW provided social stimulation, active listening, assess needs and comfort, observation, consult with staff.  3. PATIENT/CAREGIVER EDUCATION/ COPING: Patient is a alert and oriented to self and is generally confused. SW reinforced access to palliative care to the staff regarding any changes in condition or level of care change. 4. PERSONAL EMERGENCY PLAN: 911 can be activated for any emergencies. 5. COMMUNITY RESOURCES COORDINATION/ HEALTH CARE NAVIGATION: Patient has hired personal care assistance to assist with meal escort, bathing and dressing. 6. FINANCIAL/LEGAL  CONCERNS/INTERVENTIONS: No financial or legal or issues.     SOCIAL HX:  Social History   Tobacco Use  . Smoking status: Never Smoker  . Smokeless tobacco: Never Used  Substance Use Topics  . Alcohol use: Never    CODE STATUS:   Code Status: DNR  ADVANCED DIRECTIVES: N MOST FORM COMPLETE:  No HOSPICE EDUCATION PROVIDED: No  PPS: Patient ambulate independently. She is pleasantly confused. She has assistance with meal escort, personal care and dressing.  Duration of visit is 60 minutes.      [address] [place], [place]

## 2019-08-21 DIAGNOSIS — Z1159 Encounter for screening for other viral diseases: Secondary | ICD-10-CM | POA: Diagnosis not present

## 2019-08-21 DIAGNOSIS — Z20828 Contact with and (suspected) exposure to other viral communicable diseases: Secondary | ICD-10-CM | POA: Diagnosis not present

## 2019-08-28 DIAGNOSIS — Z1159 Encounter for screening for other viral diseases: Secondary | ICD-10-CM | POA: Diagnosis not present

## 2019-08-28 DIAGNOSIS — Z20828 Contact with and (suspected) exposure to other viral communicable diseases: Secondary | ICD-10-CM | POA: Diagnosis not present

## 2019-09-10 ENCOUNTER — Other Ambulatory Visit: Payer: Medicare Other

## 2019-09-10 ENCOUNTER — Other Ambulatory Visit: Payer: Self-pay

## 2019-09-10 DIAGNOSIS — Z515 Encounter for palliative care: Secondary | ICD-10-CM

## 2019-09-11 DIAGNOSIS — Z20828 Contact with and (suspected) exposure to other viral communicable diseases: Secondary | ICD-10-CM | POA: Diagnosis not present

## 2019-09-11 DIAGNOSIS — Z1159 Encounter for screening for other viral diseases: Secondary | ICD-10-CM | POA: Diagnosis not present

## 2019-09-12 NOTE — Progress Notes (Signed)
COMMUNITY PALLIATIVE CARE SW NOTE  PATIENT NAME: [name] [name] DOB: June 30, 1931 MRN: [id number]  PRIMARY CARE PROVIDER: Lauree Chandler, NP  RESPONSIBLE PARTY:  Acct ID - Guarantor Home Phone Work Phone Relationship Acct Type  [id number] - Gil,Kaeley [phone]  Self P/F     [place], [place], [place] [address]     PLAN OF CARE and INTERVENTIONS:             1. GOALS OF CARE/ ADVANCE CARE PLANNING:  Goal is for patient to maintatin her independence. Patient is a DNR. 2. SOCIAL/EMOTIONAL/SPIRITUAL ASSESSMENT/ INTERVENTIONS:   SW completed a face-to-face visit with patient at the facility ([facility]). Patient was up was She greeted SW will. She denied that she was outside, ambulating at will. Patient stated that she was getting some fresh air. Patient denied having any pain or discomfort. She was alert and oriented to self only and is otherwise pleasantly confused. Patient continues to repeat herself, making the same statements several times. She could respond to simple yes/no questions.She was appropriately dressed. W consulted with the staff at the facility regarding patient status. No significant changes noted. Patient remains very confused and is shunned by some of peers as her cognitive deficits are so profound. The facility staff report that they are waiting on family to decide when they are ready for patient to move to memory care.SW provided social stimulation, active listening, assess needs and comfort, observation, consult with staff. 3. PATIENT/CAREGIVER EDUCATION/ COPING:  Patient is a alert and oriented to self and is generally confused. SW reinforced access to palliative care to the staff regarding any changes in condition or level of care change. 4. PERSONAL EMERGENCY PLAN: Per facility protocol. 911 can be activated for any emergencies. 5. COMMUNITY RESOURCES COORDINATION/ HEALTH CARE NAVIGATION:   Patient has hired personal care assistance to assist with meal escort,  bathing and dressing. 6. FINANCIAL/LEGAL CONCERNS/INTERVENTIONS:  No financial or legal issues presented.     SOCIAL HX:  Social History   Tobacco Use  . Smoking status: Never Smoker  . Smokeless tobacco: Never Used  Substance Use Topics  . Alcohol use: Never    CODE STATUS:   Code Status: DNR  ADVANCED DIRECTIVES: No MOST FORM COMPLETE: No HOSPICE EDUCATION PROVIDED: No  PPS: Patient ambulate independently. She is pleasantly confused. She has assistance with meal escort, personal care and dressing.  Duration of visit and documentation 45 minutes.    [address] [place], [place]

## 2019-09-20 ENCOUNTER — Other Ambulatory Visit: Payer: Self-pay

## 2019-09-20 ENCOUNTER — Ambulatory Visit
Admission: RE | Admit: 2019-09-20 | Discharge: 2019-09-20 | Disposition: A | Discharge status: Home / Self Care | Payer: Medicare Other | Source: Ambulatory Visit | Attending: Nurse Practitioner | Admitting: Nurse Practitioner

## 2019-09-20 DIAGNOSIS — Z780 Asymptomatic menopausal state: Secondary | ICD-10-CM | POA: Diagnosis not present

## 2019-09-20 DIAGNOSIS — E2839 Other primary ovarian failure: Secondary | ICD-10-CM

## 2019-09-24 DIAGNOSIS — H4321 Crystalline deposits in vitreous body, right eye: Secondary | ICD-10-CM | POA: Diagnosis not present

## 2019-09-24 DIAGNOSIS — H401232 Low-tension glaucoma, bilateral, moderate stage: Secondary | ICD-10-CM | POA: Diagnosis not present

## 2019-09-24 DIAGNOSIS — H43813 Vitreous degeneration, bilateral: Secondary | ICD-10-CM | POA: Diagnosis not present

## 2019-09-24 DIAGNOSIS — H04123 Dry eye syndrome of bilateral lacrimal glands: Secondary | ICD-10-CM | POA: Diagnosis not present

## 2019-10-09 ENCOUNTER — Telehealth: Payer: Self-pay

## 2019-10-09 NOTE — Telephone Encounter (Signed)
Paper work is here for [name] [name] to go to [employer], [facility] along with TB and request for chest X-ray. I have started the paper work and will put in your box, however an appointment needs to be made for patient to come in so the paper work can be finished. I called and left a message in hopes that  [name] will call back. This is just an FYI no need to respond. I will do a follow up call.

## 2019-10-11 ENCOUNTER — Encounter: Payer: Self-pay | Admitting: Nurse Practitioner

## 2019-10-11 ENCOUNTER — Ambulatory Visit (INDEPENDENT_AMBULATORY_CARE_PROVIDER_SITE_OTHER): Payer: Medicare Other | Admitting: Nurse Practitioner

## 2019-10-11 ENCOUNTER — Other Ambulatory Visit: Payer: Self-pay

## 2019-10-11 VITALS — BP 118/80 | HR 73 | Temp 96.6°F | Ht 67.0 in | Wt 135.0 lb

## 2019-10-11 DIAGNOSIS — D72819 Decreased white blood cell count, unspecified: Secondary | ICD-10-CM

## 2019-10-11 DIAGNOSIS — N1832 Chronic kidney disease, stage 3b: Secondary | ICD-10-CM

## 2019-10-11 DIAGNOSIS — F0390 Unspecified dementia without behavioral disturbance: Secondary | ICD-10-CM

## 2019-10-11 DIAGNOSIS — I10 Essential (primary) hypertension: Secondary | ICD-10-CM | POA: Diagnosis not present

## 2019-10-11 DIAGNOSIS — Z111 Encounter for screening for respiratory tuberculosis: Secondary | ICD-10-CM | POA: Diagnosis not present

## 2019-10-11 NOTE — Patient Instructions (Signed)
Getting lab work today- okay to cancel current follow up and make follow up in 6 months.

## 2019-10-11 NOTE — Progress Notes (Signed)
Careteam: Patient Care Team: Lauree Chandler, NP as PCP - General (Geriatric Medicine) Duffy, [name], LCSW as Social Worker (Licensed Holiday representative)  PLACE OF SERVICE:  [place]  Advanced Directive information    Allergies  Allergen Reactions  . Exelon [Rivastigmine Tartrate] Other (See Comments)    Dizziness, stomach aches, confusion and paranoid     Chief Complaint  Patient presents with  . Form Completion    Patient will be transforming from one location to another and needs FL2 form and TB skin test.      HPI: Patient is a 86 y.o. female to complete FL2 paperwork She is planning to move to assisted living locked memory care unit due to issues with wandering and concerns of safety related to this. She wandered off campus at current assisted living.   Walking this morning. Physically able to walk and get around but will get lost.  Some behaviors but not combative.  Occasionally she will be lucid and aware of what is going on but baseline dementia.  Caregiver assists with bath time, dressing and meals. She has to cut up food but pt will feed herself. Continent of bowels and bladder.  No issues with frequency of urine Bowels moving regular per caregiver.    Review of Systems:  Review of Systems  Unable to perform ROS: Dementia    Past Medical History:  Diagnosis Date  . Alzheimer's dementia ([place])   . Arthritis    Osteoarthritis  . Chicken pox    as a child  . Hypertension   . Mumps    as a child  . MVP (mitral valve prolapse)    Past Surgical History:  Procedure Laterality Date  . ABDOMINAL HYSTERECTOMY    . CATARACT EXTRACTION Right 11 2013  . EYE SURGERY    . 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 TONSILLECTOMY AND ADENOIDECTOMY     Social History:   reports that she has never smoked. She has never used smokeless tobacco. She reports that she does not drink alcohol and does not use drugs.  Family History  Problem Relation Age of  Onset  . Alzheimer's disease Mother   . Cancer Father   . Diabetes Sister   . Hypertension Sister   . Hypertension Brother   . Diabetes Brother     Medications: Patient's Medications  New Prescriptions   No medications on file  Previous Medications   ASPIRIN EC 81 MG TABLET    Take 81 mg by mouth daily.   BRIMONIDINE (ALPHAGAN P) 0.1 % SOLN    Place 1 drop 2 (two) times daily into both eyes.   HYDROCHLOROTHIAZIDE (HYDRODIURIL) 25 MG TABLET    TAKE 1 TABLET ONCE DAILY.   LISINOPRIL (ZESTRIL) 20 MG TABLET    TAKE 1 TABLET DAILY TO CONTROL BLOOD PRESSURE.   POTASSIUM CHLORIDE SA (KLOR-CON) 20 MEQ TABLET    TAKE 1 TABLET ONCE DAILY.  Modified Medications   No medications on file  Discontinued Medications   No medications on file    Physical Exam:  Vitals:   10/11/19 1133  BP: 140/78  Pulse: 73  Temp: (!) 96.6 F (35.9 C)  TempSrc: Temporal  SpO2: 99%  Weight: 135 lb (61.2 kg)  Height: 5\' 7"  (1.702 m)   Body mass index is 21.14 kg/m. Wt Readings from Last 3 Encounters:  10/11/19 135 lb (61.2 kg)  07/03/19 136 lb (61.7 kg)  07/03/19 136 lb  9.6 oz (62 kg)    Physical Exam Constitutional:      General: She is not in acute distress.    Appearance: She is not diaphoretic.     Comments: Thin elderly female, NAD  HENT:     Head: Normocephalic and atraumatic.     Mouth/Throat:     Pharynx: No oropharyngeal exudate.  Eyes:     Conjunctiva/sclera: Conjunctivae normal.     Pupils: Pupils are equal, round, and reactive to light.  Cardiovascular:     Rate and Rhythm: Normal rate and regular rhythm.     Heart sounds: Normal heart sounds.  Pulmonary:     Effort: Pulmonary effort is normal.     Breath sounds: Normal breath sounds.  Abdominal:     General: Bowel sounds are normal.     Palpations: Abdomen is soft.  Musculoskeletal:        General: No tenderness.     Cervical back: Normal range of motion and neck supple.  Skin:    General: Skin is warm and dry.    Neurological:     Mental Status: She is alert. Mental status is at baseline.  Psychiatric:        Cognition and Memory: Cognition is impaired. Memory is impaired. She exhibits impaired recent memory and impaired remote memory.     Labs reviewed: Basic Metabolic Panel: Recent Labs    11/28/18 1144 04/11/19 1138 07/03/19 1417  NA 142 143 145  K 4.2 4.1 4.0  CL 104 106 106  CO2 32 22 29  GLUCOSE 66 66* 94  BUN 31* 22 26*  CREATININE 1.21* 1.05* 1.34*  CALCIUM 9.6 9.2 9.4   Liver Function Tests: Recent Labs    07/03/19 1417  AST 19  ALT 12  BILITOT 0.4  PROT 7.6   No results for input(s): LIPASE, AMYLASE in the last 8760 hours. No results for input(s): AMMONIA in the last 8760 hours. CBC: Recent Labs    04/11/19 1138 07/03/19 1417  WBC 3.4* 3.6*  NEUTROABS  --  1,958  HGB 13.0 13.1  HCT 43.3 40.9  MCV 96.2 89.7  PLT 178 187   Lipid Panel: No results for input(s): CHOL, HDL, LDLCALC, TRIG, CHOLHDL, LDLDIRECT in the last 8760 hours. TSH: No results for input(s): TSH in the last 8760 hours. A1C: No results found for: HGBA1C   Assessment/Plan 1. Dementia without behavioral disturbance, unspecified dementia type ([place]) -progressive decline, she is wander more and for safety moving to a locked facility with caregivers.  -FL2 completed with paperwork - QuantiFERON-Gold Plus  2. Essential hypertension -improved on recheck. Continues on lisinopril with HCTZ and potassium supplement. Low sodium diet.  - CBC with Differential/Platelet - COMPLETE METABOLIC PANEL WITH GFR  3. Stage 3b chronic kidney disease -Encourage proper hydration and to avoid NSAIDS (Aleve, Advil, Motrin, Ibuprofen)  - COMPLETE METABOLIC PANEL WITH GFR  4. Leukopenia, unspecified type - CBC with Differential/Platelet  5. Screening-pulmonary TB - QuantiFERON-TB Gold Plus  Next appt: 6 months. Sooner if needed  [employer]. [place], [facility] [phone]

## 2019-10-12 LAB — COMPLETE METABOLIC PANEL WITH GFR
AG Ratio: 1.1 (calc) (ref 1.0–2.5)
ALT: 13 U/L (ref 6–29)
AST: 22 U/L (ref 10–35)
Albumin: 3.8 g/dL (ref 3.6–5.1)
Alkaline phosphatase (APISO): 38 U/L (ref 37–153)
BUN/Creatinine Ratio: 20 (calc) (ref 6–22)
BUN: 28 mg/dL — ABNORMAL HIGH (ref 7–25)
CO2: 28 mmol/L (ref 20–32)
Calcium: 9.4 mg/dL (ref 8.6–10.4)
Chloride: 105 mmol/L (ref 98–110)
Creat: 1.38 mg/dL — ABNORMAL HIGH (ref 0.60–0.88)
GFR, Est African American: 40 mL/min/{1.73_m2} — ABNORMAL LOW (ref >=60)
GFR, Est Non African American: 35 mL/min/{1.73_m2} — ABNORMAL LOW (ref >=60)
Globulin: 3.5 g/dL (calc) (ref 1.9–3.7)
Glucose, Bld: 76 mg/dL (ref 65–99)
Potassium: 4.2 mmol/L (ref 3.5–5.3)
Sodium: 144 mmol/L (ref 135–146)
Total Bilirubin: 0.4 mg/dL (ref 0.2–1.2)
Total Protein: 7.3 g/dL (ref 6.1–8.1)

## 2019-10-12 LAB — CBC WITH DIFFERENTIAL/PLATELET
Absolute Monocytes: 479 cells/uL (ref 200–950)
Basophils Absolute: 32 cells/uL (ref 0–200)
Basophils Relative: 1.1 %
Eosinophils Absolute: 52 cells/uL (ref 15–500)
Eosinophils Relative: 1.8 %
HCT: 42 % (ref 35.0–45.0)
Hemoglobin: 13.6 g/dL (ref 11.7–15.5)
Lymphs Abs: 864 cells/uL (ref 850–3900)
MCH: 28.8 pg (ref 27.0–33.0)
MCHC: 32.4 g/dL (ref 32.0–36.0)
MCV: 89 fL (ref 80.0–100.0)
MPV: 11 fL (ref 7.5–12.5)
Monocytes Relative: 16.5 %
Neutro Abs: 1473 cells/uL — ABNORMAL LOW (ref 1500–7800)
Neutrophils Relative %: 50.8 %
Platelets: 179 10*3/uL (ref 140–400)
RBC: 4.72 10*6/uL (ref 3.80–5.10)
RDW: 12.4 % (ref 11.0–15.0)
Total Lymphocyte: 29.8 %
WBC: 2.9 10*3/uL — ABNORMAL LOW (ref 3.8–10.8)

## 2019-10-12 LAB — QUANTIFERON-TB GOLD PLUS
Mitogen-NIL: >10 IU/mL
NIL: 0.08 IU/mL
QuantiFERON-TB Gold Plus: NEGATIVE
TB1-NIL: <0 IU/mL
TB2-NIL: 0.01 IU/mL

## 2019-10-15 ENCOUNTER — Telehealth: Payer: Self-pay

## 2019-10-15 NOTE — Telephone Encounter (Signed)
Melissa with [facility] called requesting we fax TB results and paperwork for patient to move to memory care.  Information was faxed as requested to [phone]. Copy of paperwork sent to scanning

## 2019-10-16 DIAGNOSIS — Z20828 Contact with and (suspected) exposure to other viral communicable diseases: Secondary | ICD-10-CM | POA: Diagnosis not present

## 2019-10-16 DIAGNOSIS — Z1159 Encounter for screening for other viral diseases: Secondary | ICD-10-CM | POA: Diagnosis not present

## 2019-10-23 DIAGNOSIS — Z1159 Encounter for screening for other viral diseases: Secondary | ICD-10-CM | POA: Diagnosis not present

## 2019-10-23 DIAGNOSIS — Z20828 Contact with and (suspected) exposure to other viral communicable diseases: Secondary | ICD-10-CM | POA: Diagnosis not present

## 2019-10-30 DIAGNOSIS — Z20828 Contact with and (suspected) exposure to other viral communicable diseases: Secondary | ICD-10-CM | POA: Diagnosis not present

## 2019-10-30 DIAGNOSIS — Z1159 Encounter for screening for other viral diseases: Secondary | ICD-10-CM | POA: Diagnosis not present

## 2019-11-01 DIAGNOSIS — I739 Peripheral vascular disease, unspecified: Secondary | ICD-10-CM | POA: Diagnosis not present

## 2019-11-01 DIAGNOSIS — L603 Nail dystrophy: Secondary | ICD-10-CM | POA: Diagnosis not present

## 2019-11-01 DIAGNOSIS — L84 Corns and callosities: Secondary | ICD-10-CM | POA: Diagnosis not present

## 2019-11-06 ENCOUNTER — Non-Acute Institutional Stay: Payer: Medicare Other

## 2019-11-06 ENCOUNTER — Other Ambulatory Visit: Payer: Self-pay

## 2019-11-06 DIAGNOSIS — Z20828 Contact with and (suspected) exposure to other viral communicable diseases: Secondary | ICD-10-CM | POA: Diagnosis not present

## 2019-11-06 DIAGNOSIS — Z515 Encounter for palliative care: Secondary | ICD-10-CM

## 2019-11-06 DIAGNOSIS — Z1159 Encounter for screening for other viral diseases: Secondary | ICD-10-CM | POA: Diagnosis not present

## 2019-11-06 NOTE — Progress Notes (Signed)
COMMUNITY PALLIATIVE CARE SW NOTE  PATIENT NAME: [name] [name] DOB: 30-May-1931 MRN: [id number]  PRIMARY CARE PROVIDER: Lauree Chandler, NP  RESPONSIBLE PARTY:  Acct ID - Guarantor Home Phone Work Phone Relationship Acct Type  [id number] - [name],[name] [phone]  Self P/F     [address], [place], [place] [address]     PLAN OF CARE and INTERVENTIONS:             1. GOALS OF CARE/ ADVANCE CARE PLANNING:   Goal is for patient to maintatin her independence. Patient is a DNR 2. SOCIAL/EMOTIONAL/SPIRITUAL ASSESSMENT/ INTERVENTIONS:  SW visited patient at the facility ([facility]). Patient moved into the memory care on 10/25/19. Patient was present in her room, napping but easily aroused with verbal prompting. Patient reported that she was tired and was sleepy. SW was able to briefly engage her. Her statements were repetitive and scattered. However she said several times that she was tired and yawned. She was receptive to positive statements by SW. She denied pain and did not appear to be in any distress or discomfort. SW consulted with facility med tech-Lucretia. She reported that patient seems to be adjusting well to the locked facility. She continue so to ambulate independently at will throughout the day. She napping when she is not stimulated. Patient's appetite remains fair. She remains pleasantly confused. SW provided verbal prompting, social stimulation, active listening, assess needs and comfort, observation, consult with staff. SW to visit patient monthly to assess psychosocial needs and provide support.  3. PATIENT/CAREGIVER EDUCATION/ COPING:  Patient is alert and oriented to self. Patient appears to be coping well.  4. PERSONAL EMERGENCY PLAN:  Per facility protocol 5. COMMUNITY RESOURCES COORDINATION/ HEALTH CARE NAVIGATION:  Patient has hired personal care assistance for meal escort, bathing and dressing assistance. 6. FINANCIAL/LEGAL CONCERNS/INTERVENTIONS:  No financial  or legal concerns.      SOCIAL HX:  Social History   Tobacco Use   Smoking status: Never Smoker   Smokeless tobacco: Never Used  Substance Use Topics   Alcohol use: Never    CODE STATUS: DNR ADVANCED DIRECTIVES: No MOST FORM COMPLETE:  No HOSPICE EDUCATION PROVIDED: No  PPS: Patient ambulate independently. She is pleasantly confused. She has assistance with meal escort, personal care and dressing.  Duration of visit and documentation 45 minutes.        [address] [place], [place]

## 2019-11-13 DIAGNOSIS — Z1159 Encounter for screening for other viral diseases: Secondary | ICD-10-CM | POA: Diagnosis not present

## 2019-11-13 DIAGNOSIS — Z20828 Contact with and (suspected) exposure to other viral communicable diseases: Secondary | ICD-10-CM | POA: Diagnosis not present

## 2019-11-14 ENCOUNTER — Other Ambulatory Visit: Payer: Self-pay | Admitting: Nurse Practitioner

## 2019-11-14 DIAGNOSIS — I10 Essential (primary) hypertension: Secondary | ICD-10-CM

## 2019-11-20 DIAGNOSIS — Z20828 Contact with and (suspected) exposure to other viral communicable diseases: Secondary | ICD-10-CM | POA: Diagnosis not present

## 2019-11-20 DIAGNOSIS — Z1159 Encounter for screening for other viral diseases: Secondary | ICD-10-CM | POA: Diagnosis not present

## 2019-11-25 ENCOUNTER — Non-Acute Institutional Stay: Payer: Medicare Other

## 2019-11-25 DIAGNOSIS — Z515 Encounter for palliative care: Secondary | ICD-10-CM

## 2019-11-26 ENCOUNTER — Other Ambulatory Visit: Payer: Self-pay

## 2019-11-27 DIAGNOSIS — Z20828 Contact with and (suspected) exposure to other viral communicable diseases: Secondary | ICD-10-CM | POA: Diagnosis not present

## 2019-11-27 DIAGNOSIS — Z1159 Encounter for screening for other viral diseases: Secondary | ICD-10-CM | POA: Diagnosis not present

## 2019-11-29 DIAGNOSIS — Z1159 Encounter for screening for other viral diseases: Secondary | ICD-10-CM | POA: Diagnosis not present

## 2019-11-29 DIAGNOSIS — Z20828 Contact with and (suspected) exposure to other viral communicable diseases: Secondary | ICD-10-CM | POA: Diagnosis not present

## 2019-12-02 ENCOUNTER — Other Ambulatory Visit: Payer: Self-pay

## 2019-12-02 ENCOUNTER — Ambulatory Visit (INDEPENDENT_AMBULATORY_CARE_PROVIDER_SITE_OTHER): Payer: Medicare Other | Admitting: Nurse Practitioner

## 2019-12-02 VITALS — BP 132/80 | HR 77 | Temp 96.4°F | Ht 67.0 in | Wt 137.0 lb

## 2019-12-02 DIAGNOSIS — K625 Hemorrhage of anus and rectum: Secondary | ICD-10-CM | POA: Diagnosis not present

## 2019-12-02 LAB — CBC WITH DIFFERENTIAL/PLATELET
Absolute Monocytes: 528 cells/uL (ref 200–950)
Basophils Absolute: 19 cells/uL (ref 0–200)
Basophils Relative: 0.6 %
Eosinophils Absolute: 51 cells/uL (ref 15–500)
Eosinophils Relative: 1.6 %
HCT: 40.9 % (ref 35.0–45.0)
Hemoglobin: 12.9 g/dL (ref 11.7–15.5)
Lymphs Abs: 1011 cells/uL (ref 850–3900)
MCH: 28.4 pg (ref 27.0–33.0)
MCHC: 31.5 g/dL — ABNORMAL LOW (ref 32.0–36.0)
MCV: 90.1 fL (ref 80.0–100.0)
MPV: 10.8 fL (ref 7.5–12.5)
Monocytes Relative: 16.5 %
Neutro Abs: 1590 cells/uL (ref 1500–7800)
Neutrophils Relative %: 49.7 %
Platelets: 195 10*3/uL (ref 140–400)
RBC: 4.54 10*6/uL (ref 3.80–5.10)
RDW: 12.2 % (ref 11.0–15.0)
Total Lymphocyte: 31.6 %
WBC: 3.2 10*3/uL — ABNORMAL LOW (ref 3.8–10.8)

## 2019-12-02 NOTE — Progress Notes (Signed)
Careteam: Patient Care Team: Lauree Chandler, NP as PCP - General (Geriatric Medicine) Duffy, [name], LCSW as Social Worker (Licensed Holiday representative)  PLACE OF SERVICE:  [place]  Advanced Directive information    Allergies  Allergen Reactions  . Exelon [Rivastigmine Tartrate] Other (See Comments)    Dizziness, stomach aches, confusion and paranoid     Chief Complaint  Patient presents with  . Acute Visit    Blood, Here with Niece, Kalman Shan      HPI: Patient is a 86 y.o. female due to blood in her brief.  She had a BM at her nieces house, there was blood in pull up (did not see stool). This was 4 days ago (bright red), small amount. The next day saw a scant, not as bright amont and then None in 2 days.  Denies constipation or straining with BM.  No rectal pain per patient.  Looked for vaginal bleeding and did not see.  Otherwise doing well without complaints and at baseline.   Review of Systems:  Review of Systems  Unable to perform ROS: Dementia    Past Medical History:  Diagnosis Date  . Alzheimer's dementia ([place])   . Arthritis    Osteoarthritis  . Chicken pox    as a child  . Hypertension   . Mumps    as a child  . MVP (mitral valve prolapse)    Past Surgical History:  Procedure Laterality Date  . ABDOMINAL HYSTERECTOMY    . CATARACT EXTRACTION Right 11 2013  . EYE SURGERY    . 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 TONSILLECTOMY AND ADENOIDECTOMY     Social History:   reports that she has never smoked. She has never used smokeless tobacco. She reports that she does not drink alcohol and does not use drugs.  Family History  Problem Relation Age of Onset  . Alzheimer's disease Mother   . Cancer Father   . Diabetes Sister   . Hypertension Sister   . Hypertension Brother   . Diabetes Brother     Medications: Patient's Medications  New Prescriptions   No medications on file  Previous Medications   ASPIRIN EC 81 MG TABLET    Take 81  mg by mouth daily.   BRIMONIDINE (ALPHAGAN P) 0.1 % SOLN    Place 1 drop 2 (two) times daily into both eyes.   HYDROCHLOROTHIAZIDE (HYDRODIURIL) 25 MG TABLET    TAKE 1 TABLET ONCE DAILY.   LISINOPRIL (ZESTRIL) 20 MG TABLET    TAKE 1 TABLET DAILY TO CONTROL BLOOD PRESSURE.   POTASSIUM CHLORIDE SA (KLOR-CON) 20 MEQ TABLET    TAKE 1 TABLET ONCE DAILY.  Modified Medications   No medications on file  Discontinued Medications   No medications on file    Physical Exam:  Vitals:   12/02/19 1047  BP: 132/80  Pulse: 77  Temp: (!) 96.4 F (35.8 C)  TempSrc: Temporal  SpO2: 99%  Weight: 137 lb (62.1 kg)  Height: 5\' 7"  (1.702 m)   Body mass index is 21.46 kg/m. Wt Readings from Last 3 Encounters:  12/02/19 137 lb (62.1 kg)  10/11/19 135 lb (61.2 kg)  07/03/19 136 lb (61.7 kg)    Physical Exam Constitutional:      General: She is not in acute distress.    Appearance: She is well-developed. She is not diaphoretic.  HENT:     Head: Normocephalic and atraumatic.  Mouth/Throat:     Pharynx: No oropharyngeal exudate.  Eyes:     Conjunctiva/sclera: Conjunctivae normal.     Pupils: Pupils are equal, round, and reactive to light.  Cardiovascular:     Rate and Rhythm: Normal rate and regular rhythm.     Heart sounds: Normal heart sounds.  Pulmonary:     Effort: Pulmonary effort is normal.     Breath sounds: Normal breath sounds.  Abdominal:     General: Bowel sounds are normal.     Palpations: Abdomen is soft.  Genitourinary:    General: Normal vulva.     Vagina: No vaginal discharge.     Rectum: Normal. Guaiac result negative.  Musculoskeletal:        General: No tenderness.     Cervical back: Normal range of motion and neck supple.  Skin:    General: Skin is warm and dry.  Neurological:     Mental Status: She is alert. Mental status is at baseline.     Labs reviewed: Basic Metabolic Panel: Recent Labs    04/11/19 1138 07/03/19 1417 10/11/19 1202  NA 143 145 144    K 4.1 4.0 4.2  CL 106 106 105  CO2 22 29 28   GLUCOSE 66* 94 76  BUN 22 26* 28*  CREATININE 1.05* 1.34* 1.38*  CALCIUM 9.2 9.4 9.4   Liver Function Tests: Recent Labs    07/03/19 1417 10/11/19 1202  AST 19 22  ALT 12 13  BILITOT 0.4 0.4  PROT 7.6 7.3   No results for input(s): LIPASE, AMYLASE in the last 8760 hours. No results for input(s): AMMONIA in the last 8760 hours. CBC: Recent Labs    04/11/19 1138 07/03/19 1417 10/11/19 1202  WBC 3.4* 3.6* 2.9*  NEUTROABS  --  1,958 1,473*  HGB 13.0 13.1 13.6  HCT 43.3 40.9 42.0  MCV 96.2 89.7 89.0  PLT 178 187 179   Lipid Panel: No results for input(s): CHOL, HDL, LDLCALC, TRIG, CHOLHDL, LDLDIRECT in the last 8760 hours. TSH: No results for input(s): TSH in the last 8760 hours. A1C: No results found for: HGBA1C   Assessment/Plan 1. Rectal bleeding Noted twice on brief. None for past 2 days. Rectal exam without abnormal finding. No hemoccult negative. Family and staff will continue to monitor  - CBC with Differential/Platelet  Next appt: 12/20/2019 [facility]. [place], [facility] [phone]

## 2019-12-04 DIAGNOSIS — Z1159 Encounter for screening for other viral diseases: Secondary | ICD-10-CM | POA: Diagnosis not present

## 2019-12-04 DIAGNOSIS — Z20828 Contact with and (suspected) exposure to other viral communicable diseases: Secondary | ICD-10-CM | POA: Diagnosis not present

## 2019-12-05 ENCOUNTER — Encounter: Payer: Self-pay | Admitting: Nurse Practitioner

## 2019-12-11 DIAGNOSIS — Z20828 Contact with and (suspected) exposure to other viral communicable diseases: Secondary | ICD-10-CM | POA: Diagnosis not present

## 2019-12-11 DIAGNOSIS — Z1159 Encounter for screening for other viral diseases: Secondary | ICD-10-CM | POA: Diagnosis not present

## 2019-12-11 NOTE — Progress Notes (Signed)
COMMUNITY PALLIATIVE CARE SW NOTE  PATIENT NAME: [name] [name] DOB: 25-Feb-1932 MRN: [id number]  PRIMARY CARE PROVIDER: Lauree Chandler, NP  RESPONSIBLE PARTY:  Acct ID - Guarantor Home Phone Work Phone Relationship Acct Type  [id number] - Hada,Mariska [phone]  Self P/F     [address], [place], [place] [address]     PLAN OF CARE and INTERVENTIONS:             1. GOALS OF CARE/ ADVANCE CARE PLANNING:  Goal is for patient tomaintatinher independence. Patient is a DNR, form on file. 2. SOCIAL/EMOTIONAL/SPIRITUAL ASSESSMENT/ INTERVENTIONS:   SW visited patient at the facility ([facility]Patient was present in her room, napping but easily aroused with verbal prompting. SW has found patient napping the past two visits. Patient reported that she was tired and was sleepy. She stated that she finds herself sleeping more and didn't know why. SW was able to briefly engage her. Her statements were repetitive and scattered at times. However, patient enjoyed talking abou there piano. She denied pain and did not appear to be in any distress or discomfort. SW consulted with facility med tech-Lucretia. She reported that patient seems to be adjusting well to the locked facility. She continue so to ambulate independently at will throughout the day. She napping when she is not stimulated. Patient's appetite remains fair. She remains pleasantly confused. SW provided verbal prompting, social stimulation, active listening, assess needs and comfort, observation, consult with staff. SW to visit patient monthly to assess psychosocial needs and provide support.  3. PATIENT/CAREGIVER EDUCATION/ COPING:  Patient appears to be coping well in the memory care setting. She remains pleasantly confused, but sleeping more.  4. PERSONAL EMERGENCY PLAN:  Per facility protocol.  5. COMMUNITY RESOURCES COORDINATION/ HEALTH CARE NAVIGATION:  Patient has a private caregiver that comes the afternoons to assist  patient with getting ready for bed.  6. FINANCIAL/LEGAL CONCERNS/INTERVENTIONS:  None.     SOCIAL HX:  Social History   Tobacco Use  . Smoking status: Never Smoker  . Smokeless tobacco: Never Used  Substance Use Topics  . Alcohol use: Never    CODE STATUS: DNR  ADVANCED DIRECTIVES: No MOST FORM COMPLETE:  No HOSPICE EDUCATION PROVIDED: No  PPS: Patient ambulate independently. She is pleasantly confused. She has assistance personal care and dressing via private caregiver.  Duration of visit and documentation 45 minutes.           [address] [place], [place]

## 2019-12-17 ENCOUNTER — Other Ambulatory Visit: Payer: Self-pay

## 2019-12-17 ENCOUNTER — Non-Acute Institutional Stay: Payer: Medicare Other

## 2019-12-17 DIAGNOSIS — Z515 Encounter for palliative care: Secondary | ICD-10-CM

## 2019-12-18 DIAGNOSIS — Z20828 Contact with and (suspected) exposure to other viral communicable diseases: Secondary | ICD-10-CM | POA: Diagnosis not present

## 2019-12-18 DIAGNOSIS — Z1159 Encounter for screening for other viral diseases: Secondary | ICD-10-CM | POA: Diagnosis not present

## 2019-12-20 ENCOUNTER — Ambulatory Visit (INDEPENDENT_AMBULATORY_CARE_PROVIDER_SITE_OTHER): Payer: Medicare Other | Admitting: Nurse Practitioner

## 2019-12-20 ENCOUNTER — Other Ambulatory Visit: Payer: Self-pay

## 2019-12-20 ENCOUNTER — Encounter: Payer: Self-pay | Admitting: Nurse Practitioner

## 2019-12-20 VITALS — BP 128/74 | HR 60 | Temp 97.6°F | Ht 67.0 in | Wt 135.0 lb

## 2019-12-20 DIAGNOSIS — I10 Essential (primary) hypertension: Secondary | ICD-10-CM | POA: Diagnosis not present

## 2019-12-20 DIAGNOSIS — R634 Abnormal weight loss: Secondary | ICD-10-CM

## 2019-12-20 DIAGNOSIS — Z111 Encounter for screening for respiratory tuberculosis: Secondary | ICD-10-CM | POA: Diagnosis not present

## 2019-12-20 DIAGNOSIS — Z23 Encounter for immunization: Secondary | ICD-10-CM | POA: Diagnosis not present

## 2019-12-20 DIAGNOSIS — F0390 Unspecified dementia without behavioral disturbance: Secondary | ICD-10-CM | POA: Diagnosis not present

## 2019-12-20 NOTE — Progress Notes (Signed)
Careteam: Patient Care Team: Lauree Chandler, NP as PCP - General (Geriatric Medicine) Duffy, [name], LCSW as Education officer, museum (Licensed Holiday representative)  [place]:  [place] Directive information Does Patient Have a Catering manager?: Yes, Type of Advance Directive: Healthcare Power of [place];Out of facility DNR (pink MOST or yellow form), Pre-existing out of facility DNR order (yellow form or pink MOST form): Yellow form placed in chart (order not valid for inpatient use);Pink MOST form placed in chart (order not valid for inpatient use), Does patient want to make changes to medical advance directive?: No - Patient declined  Allergies  Allergen Reactions   Exelon [Rivastigmine Tartrate] Other (See Comments)    Dizziness, stomach aches, confusion and paranoid     Chief Complaint  Patient presents with   Form Completion    Complete paperwork for assisted living facility in [place]. Patient also needs a TB skin test.    Immunizations    Flu vaccine today      HPI: Patient is a 86 y.o. female for from completion.  Niece is moving patient closer to her in [place] for ongoing care.  Needing updated FL2 completed.  TB skin test to be done today Flu vaccine today today Has been doing well in assisted living with help. Plans to move to assisted living memory care unit.   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 She has never used smokeless  tobacco. She reports that she does not drink alcohol and does not use drugs.  Family History  Problem Relation Age of Onset   Alzheimer's disease Mother    Cancer Father    Diabetes Sister    Hypertension Sister    Hypertension Brother    Diabetes Brother     Medications: Patient's Medications  New Prescriptions   No medications on file  Previous Medications   ASPIRIN EC 81 MG TABLET    Take 81 mg by mouth daily.   BRIMONIDINE (ALPHAGAN P) 0.1 % SOLN    Place 1 drop 2 (two) times daily into both eyes.   HYDROCHLOROTHIAZIDE (HYDRODIURIL) 25 MG TABLET    TAKE 1 TABLET ONCE DAILY.   LISINOPRIL (ZESTRIL) 20 MG TABLET    TAKE 1 TABLET DAILY TO CONTROL BLOOD PRESSURE.   POTASSIUM CHLORIDE SA (KLOR-CON) 20 MEQ TABLET    TAKE 1 TABLET ONCE DAILY.  Modified Medications   No medications on file  Discontinued Medications   No medications on file    Physical Exam:  Vitals:   12/20/19 1053  BP: 128/74  Pulse: 60  Temp: 97.6 F (36.4 C)  TempSrc: Temporal  SpO2: 98%  Weight: 135 lb (61.2 kg)  Height: 5\' 7"  (1.702 m)   Body mass index is 21.14 kg/m. Wt Readings from Last 3 Encounters:  12/20/19 135 lb (61.2 kg)  12/02/19 137 lb (62.1 kg)  10/11/19 135 lb (61.2 kg)    Physical Exam Constitutional:      General: She is not in acute distress.    Appearance: She is not diaphoretic.     Comments: Thin female, NAD  HENT:     Head: Normocephalic and atraumatic.     Mouth/Throat:     Pharynx: No oropharyngeal exudate.  Eyes:     Conjunctiva/sclera: Conjunctivae normal.     Pupils: Pupils are equal, round, and reactive to light.  Cardiovascular:     Rate and Rhythm: Normal rate and regular rhythm.     Heart sounds: Normal heart sounds.  Pulmonary:     Effort: Pulmonary effort is normal.     Breath sounds: Normal breath sounds.  Abdominal:     General: Bowel sounds are normal.     Palpations: Abdomen is soft.  Musculoskeletal:        General: No tenderness.      Cervical back: Normal range of motion and neck supple.  Skin:    General: Skin is warm and dry.  Neurological:     Mental Status: She is alert and oriented to person, place, and time.     Labs reviewed: Basic Metabolic Panel: Recent Labs    04/11/19 1138 07/03/19 1417 10/11/19 1202  NA 143 145 144  K 4.1 4.0 4.2  CL 106 106 105  CO2 22 29 28   GLUCOSE 66* 94 76  BUN 22 26* 28*  CREATININE 1.05* 1.34* 1.38*  CALCIUM 9.2 9.4 9.4   Liver Function Tests: Recent Labs    07/03/19 1417 10/11/19 1202  AST 19 22  ALT 12 13  BILITOT 0.4 0.4  PROT 7.6 7.3   No results for input(s): LIPASE, AMYLASE in the last 8760 hours. No results for input(s): AMMONIA in the last 8760 hours. CBC: Recent Labs    07/03/19 1417 10/11/19 1202 12/02/19 1122  WBC 3.6* 2.9* 3.2*  NEUTROABS 1,958 1,473* 1,590  HGB 13.1 13.6 12.9  HCT 40.9 42.0 40.9  MCV 89.7 89.0 90.1  PLT 187 179 195   Lipid Panel: No results for input(s): CHOL, HDL, LDLCALC, TRIG, CHOLHDL, LDLDIRECT in the last 8760 hours. TSH: No results for input(s): TSH in the last 8760 hours. A1C: No results found for: HGBA1C   Assessment/Plan 1. Need for influenza vaccination - Flu Vaccine QUAD High Dose(Fluad)  2. Screening-pulmonary TB - PPD  3. Dementia without behavioral disturbance, unspecified dementia type ([place]) Forms completed for transfer of facility. Pt going to memory care unit closer to niece who is her 22.   4. Essential hypertension Stable on lisinopril with hctz with potassium supplement.   5. Loss of weight Has stabilized with the help of her caregivers.    Next appt: 12/23/2019 to read PPD skin test Janett Billow K. [place], [facility] [phone]

## 2019-12-20 NOTE — Patient Instructions (Signed)
To schedule appt for Monday for PPD reading

## 2019-12-21 ENCOUNTER — Emergency Department (HOSPITAL_COMMUNITY): Payer: Medicare Other

## 2019-12-21 ENCOUNTER — Emergency Department (HOSPITAL_COMMUNITY)
Admission: EM | Admit: 2019-12-21 | Discharge: 2019-12-22 | Disposition: A | Discharge status: Home / Self Care | Payer: Medicare Other | Attending: Emergency Medicine | Admitting: Emergency Medicine

## 2019-12-21 ENCOUNTER — Other Ambulatory Visit: Payer: Self-pay

## 2019-12-21 ENCOUNTER — Encounter (HOSPITAL_COMMUNITY): Payer: Self-pay | Admitting: Student

## 2019-12-21 DIAGNOSIS — S199XXA Unspecified injury of neck, initial encounter: Secondary | ICD-10-CM | POA: Diagnosis not present

## 2019-12-21 DIAGNOSIS — W19XXXA Unspecified fall, initial encounter: Secondary | ICD-10-CM

## 2019-12-21 DIAGNOSIS — Z7982 Long term (current) use of aspirin: Secondary | ICD-10-CM | POA: Insufficient documentation

## 2019-12-21 DIAGNOSIS — M7981 Nontraumatic hematoma of soft tissue: Secondary | ICD-10-CM | POA: Diagnosis not present

## 2019-12-21 DIAGNOSIS — Y939 Activity, unspecified: Secondary | ICD-10-CM | POA: Insufficient documentation

## 2019-12-21 DIAGNOSIS — S3993XA Unspecified injury of pelvis, initial encounter: Secondary | ICD-10-CM | POA: Diagnosis not present

## 2019-12-21 DIAGNOSIS — G319 Degenerative disease of nervous system, unspecified: Secondary | ICD-10-CM | POA: Diagnosis not present

## 2019-12-21 DIAGNOSIS — R52 Pain, unspecified: Secondary | ICD-10-CM | POA: Diagnosis not present

## 2019-12-21 DIAGNOSIS — S0990XA Unspecified injury of head, initial encounter: Secondary | ICD-10-CM | POA: Diagnosis not present

## 2019-12-21 DIAGNOSIS — Y929 Unspecified place or not applicable: Secondary | ICD-10-CM | POA: Insufficient documentation

## 2019-12-21 DIAGNOSIS — I129 Hypertensive chronic kidney disease with stage 1 through stage 4 chronic kidney disease, or unspecified chronic kidney disease: Secondary | ICD-10-CM | POA: Insufficient documentation

## 2019-12-21 DIAGNOSIS — F0390 Unspecified dementia without behavioral disturbance: Secondary | ICD-10-CM | POA: Insufficient documentation

## 2019-12-21 DIAGNOSIS — M160 Bilateral primary osteoarthritis of hip: Secondary | ICD-10-CM | POA: Diagnosis not present

## 2019-12-21 DIAGNOSIS — N183 Chronic kidney disease, stage 3 unspecified: Secondary | ICD-10-CM | POA: Insufficient documentation

## 2019-12-21 DIAGNOSIS — M810 Age-related osteoporosis without current pathological fracture: Secondary | ICD-10-CM | POA: Diagnosis not present

## 2019-12-21 DIAGNOSIS — W010XXA Fall on same level from slipping, tripping and stumbling without subsequent striking against object, initial encounter: Secondary | ICD-10-CM | POA: Diagnosis not present

## 2019-12-21 DIAGNOSIS — R410 Disorientation, unspecified: Secondary | ICD-10-CM | POA: Diagnosis not present

## 2019-12-21 DIAGNOSIS — S7002XA Contusion of left hip, initial encounter: Secondary | ICD-10-CM | POA: Diagnosis not present

## 2019-12-21 DIAGNOSIS — Y999 Unspecified external cause status: Secondary | ICD-10-CM | POA: Diagnosis not present

## 2019-12-21 DIAGNOSIS — S79922A Unspecified injury of left thigh, initial encounter: Secondary | ICD-10-CM | POA: Diagnosis not present

## 2019-12-21 DIAGNOSIS — M47816 Spondylosis without myelopathy or radiculopathy, lumbar region: Secondary | ICD-10-CM | POA: Diagnosis not present

## 2019-12-21 DIAGNOSIS — T148XXA Other injury of unspecified body region, initial encounter: Secondary | ICD-10-CM

## 2019-12-21 DIAGNOSIS — S8992XA Unspecified injury of left lower leg, initial encounter: Secondary | ICD-10-CM | POA: Diagnosis not present

## 2019-12-21 DIAGNOSIS — Z79899 Other long term (current) drug therapy: Secondary | ICD-10-CM | POA: Diagnosis not present

## 2019-12-21 DIAGNOSIS — M1612 Unilateral primary osteoarthritis, left hip: Secondary | ICD-10-CM | POA: Diagnosis not present

## 2019-12-21 DIAGNOSIS — J341 Cyst and mucocele of nose and nasal sinus: Secondary | ICD-10-CM | POA: Diagnosis not present

## 2019-12-21 DIAGNOSIS — I672 Cerebral atherosclerosis: Secondary | ICD-10-CM | POA: Diagnosis not present

## 2019-12-21 DIAGNOSIS — S79912A Unspecified injury of left hip, initial encounter: Secondary | ICD-10-CM | POA: Diagnosis present

## 2019-12-21 DIAGNOSIS — M1712 Unilateral primary osteoarthritis, left knee: Secondary | ICD-10-CM | POA: Diagnosis not present

## 2019-12-21 DIAGNOSIS — M47812 Spondylosis without myelopathy or radiculopathy, cervical region: Secondary | ICD-10-CM | POA: Diagnosis not present

## 2019-12-21 DIAGNOSIS — I10 Essential (primary) hypertension: Secondary | ICD-10-CM | POA: Diagnosis not present

## 2019-12-21 DIAGNOSIS — J3489 Other specified disorders of nose and nasal sinuses: Secondary | ICD-10-CM | POA: Diagnosis not present

## 2019-12-21 MED ORDER — HYDROCODONE-ACETAMINOPHEN 5-325 MG PO TABS
1.0000 | ORAL_TABLET | Freq: Once | ORAL | Status: AC
  Administered 2019-12-21: 1 via ORAL
  Filled 2019-12-21: qty 1

## 2019-12-21 MED ORDER — ONDANSETRON HCL 4 MG/2ML IJ SOLN: 4.0000 mg | Freq: Once | INTRAMUSCULAR | Status: DC

## 2019-12-21 MED ORDER — MORPHINE SULFATE (PF) 4 MG/ML IV SOLN: 4.0000 mg | Freq: Once | INTRAVENOUS | Status: DC

## 2019-12-21 NOTE — Discharge Instructions (Addendum)
Please read and follow all provided instructions.  You have been seen today for a fall with left hip pain.   Tests performed today include: An x-ray of the affected area - does NOT show any broken bones or dislocations, you do have a hematoma (a soft tissue bruise) to your outer left hip which we suspect is causing most of your pain.  CT of your head did not show signs of brain bleed.  Vital signs. See below for your results today.   Home care instructions: -- *PRICE in the first 24-48 hours after injury: Protect Rest Ice- Do not apply ice pack directly to your skin, place towel or similar between your skin and ice/ice pack. Apply ice for 20 min, then remove for 40 min while awake Compression- N/A Elevate affected extremity above the level of your heart when not walking around for the first 24-48 hours   Medications:  Please take tylenol per over the counter dosing as needed for pain.   Follow-up instructions: Please follow-up with your primary care provider or the provided orthopedic physician (bone specialist) if you continue to have significant pain in 1 week. In this case you may have a more severe injury that requires further care.   Return instructions:  Please return if your digits or extremity are numb or tingling, appear gray or blue, or you have severe pain  Please return if you have redness or fevers.  Please return to the Emergency Department if you experience worsening symptoms.  Please return if you have any other emergent concerns. Additional Information:  Your vital signs today were: BP (!) 199/91 (BP Location: Right Arm)   Pulse 65   Temp 97.9 F (36.6 C) (Oral)   Resp 18   Ht 5\' 7"  (1.702 m)   Wt 60.8 kg   SpO2 98%   BMI 20.99 kg/m  If your blood pressure (BP) was elevated above 135/85 this visit, please have this repeated by your doctor within one month. ---------------

## 2019-12-21 NOTE — ED Triage Notes (Signed)
PT lives at [facility]. PT fell and injured Lt hip. Pt lives in memory care section.

## 2019-12-21 NOTE — ED Notes (Signed)
Patient transported to X-ray 

## 2019-12-21 NOTE — ED Provider Notes (Signed)
[facility] EMERGENCY DEPARTMENT Provider Note   CSN: [id number] Arrival date & time: 12/21/19  1839     History Chief Complaint  Patient presents with  . Fall    [name] [name] is a 86 y.o. female with a history of alzheimer's dementia, hypertension, & CKD who presents to the ED via EMS for evaluation of L hip pain S/p fall shortly PTA. Per EMS patient states she slipped and fell with resultant left hip pain. Patient states she is not quite sure what happened, she thinks she slipped, she is unsure of head injury or LOC, states she is having a lot of pain to the left hip and some to the left knee. Worse with movement. No alleviating factors. No intervention PTA. Denies headache, neck pain, back pain, chest pain, abdominal pain, numbness or weakness. Per facility to EMS patient @ mental status baseline. Level 5 caveat applies secondary to dementia.   HPI     Past Medical History:  Diagnosis Date  . Alzheimer's dementia ([place])   . Arthritis    Osteoarthritis  . Chicken pox    as a child  . Hypertension   . Mumps    as a child  . MVP (mitral valve prolapse)     Patient Active Problem List   Diagnosis Date Noted  . High risk medication use 01/24/2018  . CKD (chronic kidney disease) stage 3, GFR 30-59 ml/min 01/24/2018  . DDD (degenerative disc disease), lumbar 03/29/2015  . Leukopenia 03/29/2015  . Dementia without behavioral disturbance ([place]) 03/27/2015  . Alopecia 03/27/2015  . Loss of weight 12/03/2012  . Hypertension 07/12/2010  . Mitral valve prolapse 07/12/2010    Past Surgical History:  Procedure Laterality Date  . ABDOMINAL HYSTERECTOMY    . CATARACT EXTRACTION Right 11 2013  . EYE SURGERY    . 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 TONSILLECTOMY AND ADENOIDECTOMY       OB History   No obstetric history on file.     Family History  Problem Relation Age of Onset  . Alzheimer's disease Mother   . Cancer Father   . Diabetes Sister   .  Hypertension Sister   . Hypertension Brother   . Diabetes Brother     Social History   Tobacco Use  . Smoking status: Never Smoker  . Smokeless tobacco: Never Used  Vaping Use  . Vaping Use: Never used  Substance Use Topics  . Alcohol use: Never  . Drug use: No    Home Medications Prior to Admission medications   Medication Sig Start Date End Date Taking? Authorizing Provider  aspirin EC 81 MG tablet Take 81 mg by mouth daily.    [provider]  brimonidine (ALPHAGAN P) 0.1 % SOLN Place 1 drop 2 (two) times daily into both eyes.    [provider]  hydrochlorothiazide (HYDRODIURIL) 25 MG tablet TAKE 1 TABLET ONCE DAILY. 11/14/19   Lauree Chandler, NP  lisinopril (ZESTRIL) 20 MG tablet TAKE 1 TABLET DAILY TO CONTROL BLOOD PRESSURE. 11/14/19   Lauree Chandler, NP  potassium chloride SA (KLOR-CON) 20 MEQ tablet TAKE 1 TABLET ONCE DAILY. 11/14/19   Lauree Chandler, NP    Allergies    Exelon [rivastigmine tartrate]  Review of Systems   Review of Systems  Unable to perform ROS: Dementia  Constitutional: Negative for chills and fever.  Respiratory: Negative for shortness of breath.   Cardiovascular: Negative for chest pain.  Gastrointestinal: Negative for abdominal pain.  Musculoskeletal: Positive for arthralgias. Negative for back pain and neck pain.  Neurological: Negative for weakness, numbness and headaches.    Physical Exam Updated Vital Signs BP (!) 192/86   Pulse 69   Temp (!) 97.5 F (36.4 C) (Oral)   Resp 15   Ht 5\' 7"  (1.702 m)   Wt 60.8 kg   SpO2 99%   BMI 20.99 kg/m   Physical Exam Vitals and nursing note reviewed.  Constitutional:      General: She is in acute distress (mild appears uncomfortable).     Appearance: She is well-developed. She is not toxic-appearing.  HENT:     Head: Normocephalic and atraumatic.     Comments: No raccoon eyes or battle sign. Eyes:     General:        Right eye: No discharge.        Left eye: No  discharge.     Extraocular Movements: Extraocular movements intact.     Conjunctiva/sclera: Conjunctivae normal.  Neck:     Comments: C-collar in place on initial assessment and maintained. Cardiovascular:     Rate and Rhythm: Normal rate and regular rhythm.     Comments: 2+ symmetric DP and PT pulses bilaterally. Pulmonary:     Effort: Pulmonary effort is normal. No respiratory distress.     Breath sounds: Normal breath sounds. No wheezing, rhonchi or rales.  Chest:     Chest wall: No tenderness.  Abdominal:     General: There is no distension.     Palpations: Abdomen is soft.     Tenderness: There is no abdominal tenderness. There is no guarding or rebound.  Musculoskeletal:     Comments: Upper extremity: No focal bony tenderness Back: No midline tenderness or palpable step-off Lower extremities: Patient has a soft tissue hematoma to the left lateral hip over the greater trochanter which is exquisitely tender to palpation.  There is no overlying open wounds.  Intact active range of motion throughout the right lower extremity.  Left lower extremity able to move minimally at each joint, able to move digits without difficulty.  She is tender over the left hip diffusely most prominently to the lateral aspect, the left femur, as well as the left knee.  Lower extremities are otherwise nontender.  Skin:    General: Skin is warm and dry.     Findings: No rash.  Neurological:     Mental Status: She is alert.     Comments: Clear speech.  Sensation grossly intact bilateral upper and lower extremities.  5-5 symmetric strength with plantar dorsiflexion bilaterally and grip strength bilaterally.  Psychiatric:        Behavior: Behavior normal.     ED Results / Procedures / Treatments   Labs (all labs ordered are listed, but only abnormal results are displayed) Labs Reviewed  SARS CORONAVIRUS 2 BY RT PCR (HOSPITAL ORDER, [facility])  BASIC METABOLIC PANEL  CBC WITH  DIFFERENTIAL/PLATELET  PROTIME-INR  TYPE AND SCREEN    EKG None  Radiology DG Pelvis 1-2 Views  Result Date: 12/21/2019 CLINICAL DATA:  Fall EXAM: PELVIS - 1-2 VIEW COMPARISON:  None. FINDINGS: Diffuse bone demineralization. Degenerative changes in the lower lumbar spine and in both hips. Pelvis appears intact. No evidence of acute fracture or dislocation. SI joints and symphysis pubis are not displaced. Colonic stool and gas limits evaluation of the sacrum. Vascular calcifications. IMPRESSION: No acute bony abnormalities. Degenerative changes  in the lower lumbar spine and hips. Electronically Signed   By: Lucienne Capers M.D.   On: 12/21/2019 19:45   CT Head Wo Contrast  Result Date: 12/21/2019 CLINICAL DATA:  Minor head trauma resulting from a fall. EXAM: CT HEAD WITHOUT CONTRAST CT CERVICAL SPINE WITHOUT CONTRAST TECHNIQUE: Multidetector CT imaging of the head and cervical spine was performed following the standard protocol without intravenous contrast. Multiplanar CT image reconstructions of the cervical spine were also generated. COMPARISON:  CT head 08/17/2017.  CT neck 07/15/2016 FINDINGS: CT HEAD FINDINGS Brain: Diffuse cerebral atrophy. Ventricular dilatation consistent with central atrophy. Low-attenuation changes in the deep white matter consistent with small vessel ischemia. Old lacunar infarcts in the basal ganglia. No abnormal extra-axial fluid collections. No mass effect or midline shift. Gray-white matter junctions are distinct. Basal cisterns are not effaced. No acute intracranial hemorrhage. Vascular: Intracranial arterial vascular calcifications are present. Skull: Calvarium appears intact. Sinuses/Orbits: Retention cyst in the right maxillary antrum. Mucosal thickening and mucoid material in the sphenoid sinuses. No acute air-fluid levels. Mastoid air cells are clear. Other: None. CT CERVICAL SPINE FINDINGS Alignment: There is reversal of the usual cervical lordosis without  anterior subluxation. Reversal is accentuated since previous study. This is likely due to degenerative changes and patient positioning. Muscle spasm could also have this appearance in the appropriate clinical setting. Normal alignment of the posterior elements and facet joints. C1-2 articulation appears intact. Skull base and vertebrae: Skull base appears intact. No vertebral compression deformities. No focal bone lesion or bone destruction. Soft tissues and spinal canal: No prevertebral soft tissue swelling. No abnormal paraspinal soft tissue mass or infiltration. Disc levels: Degenerative changes in the cervical spine with narrowed interspaces and endplate hypertrophic change most prominent at C4-5, C5-6, and C6-7 levels. Mild degenerative changes in the facet joints. Upper chest: Visualized lung apices are clear. Other: None. IMPRESSION: 1. No acute intracranial abnormalities. Chronic atrophy and small vessel ischemic changes. Old lacunar infarcts in the basal ganglia. 2. Nonspecific reversal of the usual cervical lordosis. Degenerative changes in the cervical spine. No acute displaced fractures identified. Electronically Signed   By: Lucienne Capers M.D.   On: 12/21/2019 19:58   CT Cervical Spine Wo Contrast  Result Date: 12/21/2019 CLINICAL DATA:  Minor head trauma resulting from a fall. EXAM: CT HEAD WITHOUT CONTRAST CT CERVICAL SPINE WITHOUT CONTRAST TECHNIQUE: Multidetector CT imaging of the head and cervical spine was performed following the standard protocol without intravenous contrast. Multiplanar CT image reconstructions of the cervical spine were also generated. COMPARISON:  CT head 08/17/2017.  CT neck 07/15/2016 FINDINGS: CT HEAD FINDINGS Brain: Diffuse cerebral atrophy. Ventricular dilatation consistent with central atrophy. Low-attenuation changes in the deep white matter consistent with small vessel ischemia. Old lacunar infarcts in the basal ganglia. No abnormal extra-axial fluid  collections. No mass effect or midline shift. Gray-white matter junctions are distinct. Basal cisterns are not effaced. No acute intracranial hemorrhage. Vascular: Intracranial arterial vascular calcifications are present. Skull: Calvarium appears intact. Sinuses/Orbits: Retention cyst in the right maxillary antrum. Mucosal thickening and mucoid material in the sphenoid sinuses. No acute air-fluid levels. Mastoid air cells are clear. Other: None. CT CERVICAL SPINE FINDINGS Alignment: There is reversal of the usual cervical lordosis without anterior subluxation. Reversal is accentuated since previous study. This is likely due to degenerative changes and patient positioning. Muscle spasm could also have this appearance in the appropriate clinical setting. Normal alignment of the posterior elements and facet joints. C1-2 articulation appears intact. Skull  base and vertebrae: Skull base appears intact. No vertebral compression deformities. No focal bone lesion or bone destruction. Soft tissues and spinal canal: No prevertebral soft tissue swelling. No abnormal paraspinal soft tissue mass or infiltration. Disc levels: Degenerative changes in the cervical spine with narrowed interspaces and endplate hypertrophic change most prominent at C4-5, C5-6, and C6-7 levels. Mild degenerative changes in the facet joints. Upper chest: Visualized lung apices are clear. Other: None. IMPRESSION: 1. No acute intracranial abnormalities. Chronic atrophy and small vessel ischemic changes. Old lacunar infarcts in the basal ganglia. 2. Nonspecific reversal of the usual cervical lordosis. Degenerative changes in the cervical spine. No acute displaced fractures identified. Electronically Signed   By: Lucienne Capers M.D.   On: 12/21/2019 19:58   CT Hip Left Wo Contrast  Result Date: 12/21/2019 CLINICAL DATA:  Hip trauma with suspected fracture but negative x-ray. EXAM: CT OF THE LEFT HIP WITHOUT CONTRAST TECHNIQUE: Multidetector CT  imaging of the left hip was performed according to the standard protocol. Multiplanar CT image reconstructions were also generated. COMPARISON:  Left hip radiographs 12/21/2019 FINDINGS: Bones/Joint/Cartilage Mild degenerative change in the left hip with joint space narrowing and small osteophyte formation. No evidence of acute fracture or dislocation involving the left hip or the visualized left hemipelvis. No focal bone lesion or bone destruction. Ligaments Suboptimally assessed by CT. Muscles and Tendons Normal appearance of the musculature. Soft tissues Soft tissue hematoma lateral to the greater trochanter measuring 3.8 x 5.7 cm. Surrounding infiltration in the subcutaneous fat. Visualized internal pelvic organs are unremarkable. Vascular calcifications are present. IMPRESSION: 1. No evidence of acute fracture or dislocation involving the left hip or visualized left hemipelvis. 2. Soft tissue hematoma lateral to the greater trochanter measuring 3.8 x 5.7 cm. Surrounding infiltration in the subcutaneous fat. Electronically Signed   By: Lucienne Capers M.D.   On: 12/21/2019 20:57   DG Knee Complete 4 Views Left  Result Date: 12/21/2019 CLINICAL DATA:  Fall.  Confusion. EXAM: LEFT KNEE - COMPLETE 4+ VIEW COMPARISON:  10/29/2017 FINDINGS: Diffuse bone demineralization. Degenerative changes with narrowed medial compartment. Minimal osteophyte formation. No evidence of acute fracture or dislocation. No focal bone lesion or bone destruction. No significant effusions. Soft tissues are unremarkable. Vascular calcifications. IMPRESSION: Diffuse bone demineralization. Mild degenerative changes. No acute bony abnormalities. Electronically Signed   By: Lucienne Capers M.D.   On: 12/21/2019 19:43   DG Femur Min 2 Views Left  Result Date: 12/21/2019 CLINICAL DATA:  Fall EXAM: LEFT FEMUR 2 VIEWS COMPARISON:  None. FINDINGS: Degenerative changes in the left hip with joint space narrowing and osteophyte formation  predominantly on the acetabular side. Diffuse bone demineralization. No evidence of acute fracture or dislocation. No focal bone lesions. Soft tissue swelling and infiltration centered over the greater trochanter likely representing soft tissue hematoma. Vascular calcifications. IMPRESSION: No acute bony abnormalities. Degenerative changes in the left hip. Probable soft tissue hematoma over the greater trochanter. Electronically Signed   By: Lucienne Capers M.D.   On: 12/21/2019 19:44    Procedures Procedures (including critical care time)  Medications Ordered in ED Medications  ondansetron (ZOFRAN) injection 4 mg (has no administration in time range)  morphine 4 MG/ML injection 4 mg (has no administration in time range)    ED Course  I have reviewed the triage vital signs and the nursing notes.  Pertinent labs & imaging results that were available during my care of the patient were reviewed by me and considered in my medical  decision making (see chart for details).    MDM Rules/Calculators/A&P                         Patient presents to the ED with complaints of left hip pain status post fall shortly prior to arrival.  Patient is unsure of head injury loss of consciousness and does have a distracting injury at the left hip will obtain CT head/C-spine as well as left lower extremity x-rays.   Additional history obtained:  Additional history obtained from EMS.. Shortly following patient's arrival her niece arrived who states that she spoke with the med tech at the patient's facility related the patient was out for a walk this evening when she tripped and fell forward onto her left knee and then left hip.  She was apparently ambulatory back to the facility and started complaining of pain shortly following prompting EMS call.  The patient resides in the memory care unit of the facility.  Imaging Studies ordered:  I ordered imaging studies which included CT head/C-spine as well as x-rays of  the left femur/knee and pelvis with subsequent ordering of CT of the left hip, I independently visualized and interpreted imaging which showed no head bleed, fractures, or dislocations.   Patient given norco with application of ice for pain.  22:00: RE-EVAL: Patient is alert, answering questions appropriately, states she feels much better, she is ambulatory to the rest room with a steady gait. Overall appears appropriate for discharge home. Recommended ice wrapped in a towel 20 minutes on 40 minutes off to hematoma for the next 48 hours as well as tylenol per over the counter dosing. BP elevated in the ED- 379K systolic with this assessment- PCP follow up. I discussed results, treatment plan, need for follow-up, and return precautions with the patient & her niece at bedside (who confirms patient is at her mental status baseline). Provided opportunity for questions, patient & her niece confirmed understanding and are in agreement with plan.   Findings and plan of care discussed with supervising physician Dr. Ralene Bathe who has evaluated patient as shared visit and is in agreement.   Portions of this note were generated with [employer]. Dictation errors may occur despite best attempts at proofreading.  Final Clinical Impression(s) / ED Diagnoses Final diagnoses:  Fall, initial encounter  Hematoma    Rx / DC Orders ED Discharge Orders    None       Leafy Kindle 12/21/19 2214    Quintella Reichert, MD 12/25/19 [phone]

## 2019-12-21 NOTE — Progress Notes (Signed)
[facility]ITY PALLIATIVE CARE SW NOTE  PATIENT NAME: [name] [name] DOB: [birth year] MRN: [id number]  PRIMARY CARE PROVIDER: Lauree Chandler, NP  RESPONSIBLE PARTY:  Acct ID - Guarantor Home Phone Work Phone Relationship Acct Type  [id number] - Patin,Teigen [phone]  Self P/F     [place], [place], [place] [address]     PLAN OF CARE and INTERVENTIONS:             1. GOALS OF CARE/ ADVANCE CARE PLANNING: Goal is for patient tomaintatinher independence. Patient is a DNR, form on file.  2. SOCIAL/EMOTIONAL/SPIRITUAL ASSESSMENT/ INTERVENTIONS:  Patient was present in her room, napping but easily aroused with verbal prompting. Patient reported that she was tired, but expressed that she was happy to see SW. SW was able to briefly engage her. Her statements were repetitive and scattered. However, patient seemed to light up with the conversation was directed to her piano. She was receptive to positive statements by SW. She denied pain and did not appear to be in any distress or discomfort. SW consulted with facility med tech-Lucretia. She reported that patient will be moving to [place] at the end of life month.  She continue so to ambulate independently at will throughout the day. She napping when she is not stimulated. Patient's appetite remains fair. She remains pleasantly confused. SW telephoned her niece/PCG-[name] to verify that patient will be moving. [name] advised that patient will be moving at the end of the month. [name] advised that it was too much for her coming back and forth to visit patient and decided to mover her closer to her family. SW thanked [employer] for allowing this team to be a part of patient's care. SW extended support to her as she moves patient. SW encouraged her to call with any needs or questions. SW provided supportive presence, verbal prompting, social stimulation, active listening, assess needs and comfort, observation, consult with staff. SW to visit patient monthly  to assess psychosocial needs and provide support.  3. PATIENT/CAREGIVER EDUCATION/ COPING:  Patient appears to be coping well in the memory care setting. She remains pleasantly confused. 4. PERSONAL EMERGENCY PLAN:  Per facility protocol.  5. COMMUNITY RESOURCES COORDINATION/ HEALTH CARE NAVIGATION:  Patient will be moving to [place] permanently at the end of the month.  6. FINANCIAL/LEGAL CONCERNS/INTERVENTIONS:  None     SOCIAL HX:  Social History   Tobacco Use   Smoking status: Never Smoker   Smokeless tobacco: Never Used  Substance Use Topics   Alcohol use: Never    CODE STATUS:   Code Status: DNR  ADVANCED DIRECTIVES: No MOST FORM COMPLETE: No  HOSPICE EDUCATION PROVIDED: No  PPS: Patient ambulate independently. She is pleasantly confused. She has assistance with meal escort, personal care and dressing.  Duration of visit and documentation 60 minutes.        [address] [place], [place]

## 2019-12-21 NOTE — ED Notes (Signed)
Pt walking around in room with pa and family mmeber

## 2019-12-21 NOTE — ED Notes (Signed)
Call mADE [place] [facility] THE [facility] [place]

## 2019-12-23 ENCOUNTER — Ambulatory Visit: Payer: Medicare Other

## 2019-12-23 ENCOUNTER — Other Ambulatory Visit: Payer: Self-pay

## 2019-12-23 VITALS — BP 128/74 | Ht 67.0 in | Wt 134.0 lb

## 2019-12-23 DIAGNOSIS — Z111 Encounter for screening for respiratory tuberculosis: Secondary | ICD-10-CM

## 2019-12-23 LAB — TB SKIN TEST
Induration: 0 mm
TB Skin Test: NEGATIVE

## 2019-12-25 DIAGNOSIS — L603 Nail dystrophy: Secondary | ICD-10-CM | POA: Diagnosis not present

## 2019-12-25 DIAGNOSIS — H353120 Nonexudative age-related macular degeneration, left eye, stage unspecified: Secondary | ICD-10-CM | POA: Diagnosis not present

## 2019-12-25 DIAGNOSIS — L84 Corns and callosities: Secondary | ICD-10-CM | POA: Diagnosis not present

## 2019-12-25 DIAGNOSIS — I739 Peripheral vascular disease, unspecified: Secondary | ICD-10-CM | POA: Diagnosis not present

## 2019-12-25 DIAGNOSIS — H04123 Dry eye syndrome of bilateral lacrimal glands: Secondary | ICD-10-CM | POA: Diagnosis not present

## 2019-12-25 DIAGNOSIS — H353110 Nonexudative age-related macular degeneration, right eye, stage unspecified: Secondary | ICD-10-CM | POA: Diagnosis not present

## 2019-12-25 DIAGNOSIS — Z20828 Contact with and (suspected) exposure to other viral communicable diseases: Secondary | ICD-10-CM | POA: Diagnosis not present

## 2019-12-25 DIAGNOSIS — Z1159 Encounter for screening for other viral diseases: Secondary | ICD-10-CM | POA: Diagnosis not present

## 2020-01-01 ENCOUNTER — Telehealth: Payer: Self-pay

## 2020-01-01 NOTE — Telephone Encounter (Signed)
[name] [name] called about medical evaluation form needed for patient to move in assisted living. There are two areas on page 3 of 3 that need to be completed before patient is allowed to move in. Please fill in information and fax to  Attn: [name] [phone]. [name] [name] may be reached at [phone] if needed. She will be flying patient to [place] in a few days. Form placed in [name]'s review and sign folder.

## 2020-01-03 ENCOUNTER — Ambulatory Visit: Payer: Medicare Other | Admitting: Nurse Practitioner

## 2020-01-07 ENCOUNTER — Telehealth: Payer: Self-pay | Admitting: Nurse Practitioner

## 2020-01-07 DIAGNOSIS — Z20828 Contact with and (suspected) exposure to other viral communicable diseases: Secondary | ICD-10-CM | POA: Diagnosis not present

## 2020-01-07 DIAGNOSIS — Z1159 Encounter for screening for other viral diseases: Secondary | ICD-10-CM | POA: Diagnosis not present

## 2020-01-07 NOTE — Telephone Encounter (Signed)
[name] [name] called and asked about a FL2 form we have filled out and faxed for her, the place we have been faxing it to keeps saying they are not getting it. When we fax it, it goes through successfully. She asked if we could email her a copy of it to: [email or web address]  I asked her to have the place fax [place] the forms again so we can fill them out and I will email her the forms once theyre filled out so she can have the copy to make sure it gets where she needs it.

## 2020-01-13 DIAGNOSIS — Z1152 Encounter for screening for COVID-19: Secondary | ICD-10-CM | POA: Diagnosis not present

## 2020-01-13 DIAGNOSIS — B349 Viral infection, unspecified: Secondary | ICD-10-CM | POA: Diagnosis not present

## 2020-01-13 DIAGNOSIS — Z20822 Contact with and (suspected) exposure to covid-19: Secondary | ICD-10-CM | POA: Diagnosis not present

## 2020-01-17 DIAGNOSIS — I10 Essential (primary) hypertension: Secondary | ICD-10-CM | POA: Diagnosis not present

## 2020-01-17 DIAGNOSIS — R634 Abnormal weight loss: Secondary | ICD-10-CM | POA: Diagnosis not present

## 2020-01-17 DIAGNOSIS — N189 Chronic kidney disease, unspecified: Secondary | ICD-10-CM | POA: Diagnosis not present

## 2020-01-17 DIAGNOSIS — I341 Nonrheumatic mitral (valve) prolapse: Secondary | ICD-10-CM | POA: Diagnosis not present

## 2020-01-17 DIAGNOSIS — F0390 Unspecified dementia without behavioral disturbance: Secondary | ICD-10-CM | POA: Diagnosis not present

## 2020-01-18 DIAGNOSIS — I440 Atrioventricular block, first degree: Secondary | ICD-10-CM | POA: Diagnosis not present

## 2020-01-18 DIAGNOSIS — F0281 Dementia in other diseases classified elsewhere with behavioral disturbance: Secondary | ICD-10-CM | POA: Diagnosis not present

## 2020-01-18 DIAGNOSIS — R9431 Abnormal electrocardiogram [ECG] [EKG]: Secondary | ICD-10-CM | POA: Diagnosis not present

## 2020-01-18 DIAGNOSIS — G319 Degenerative disease of nervous system, unspecified: Secondary | ICD-10-CM | POA: Diagnosis not present

## 2020-01-18 DIAGNOSIS — D849 Immunodeficiency, unspecified: Secondary | ICD-10-CM | POA: Diagnosis not present

## 2020-01-18 DIAGNOSIS — J323 Chronic sphenoidal sinusitis: Secondary | ICD-10-CM | POA: Diagnosis not present

## 2020-01-18 DIAGNOSIS — R5381 Other malaise: Secondary | ICD-10-CM | POA: Diagnosis not present

## 2020-01-18 DIAGNOSIS — E870 Hyperosmolality and hypernatremia: Secondary | ICD-10-CM | POA: Diagnosis not present

## 2020-01-18 DIAGNOSIS — R4182 Altered mental status, unspecified: Secondary | ICD-10-CM | POA: Diagnosis not present

## 2020-01-18 DIAGNOSIS — R519 Headache, unspecified: Secondary | ICD-10-CM | POA: Diagnosis not present

## 2020-01-18 DIAGNOSIS — R030 Elevated blood-pressure reading, without diagnosis of hypertension: Secondary | ICD-10-CM | POA: Diagnosis not present

## 2020-01-18 DIAGNOSIS — F05 Delirium due to known physiological condition: Secondary | ICD-10-CM | POA: Diagnosis not present

## 2020-01-18 DIAGNOSIS — G309 Alzheimer's disease, unspecified: Secondary | ICD-10-CM | POA: Diagnosis not present

## 2020-01-18 DIAGNOSIS — I252 Old myocardial infarction: Secondary | ICD-10-CM | POA: Diagnosis not present

## 2020-01-18 DIAGNOSIS — R451 Restlessness and agitation: Secondary | ICD-10-CM | POA: Diagnosis not present

## 2020-01-18 DIAGNOSIS — I498 Other specified cardiac arrhythmias: Secondary | ICD-10-CM | POA: Diagnosis not present

## 2020-01-18 DIAGNOSIS — R531 Weakness: Secondary | ICD-10-CM | POA: Diagnosis not present

## 2020-01-18 DIAGNOSIS — E872 Acidosis: Secondary | ICD-10-CM | POA: Diagnosis not present

## 2020-01-19 DIAGNOSIS — R32 Unspecified urinary incontinence: Secondary | ICD-10-CM | POA: Diagnosis present

## 2020-01-19 DIAGNOSIS — R410 Disorientation, unspecified: Secondary | ICD-10-CM | POA: Diagnosis not present

## 2020-01-19 DIAGNOSIS — E875 Hyperkalemia: Secondary | ICD-10-CM | POA: Diagnosis not present

## 2020-01-19 DIAGNOSIS — I491 Atrial premature depolarization: Secondary | ICD-10-CM | POA: Diagnosis not present

## 2020-01-19 DIAGNOSIS — Z79899 Other long term (current) drug therapy: Secondary | ICD-10-CM | POA: Diagnosis not present

## 2020-01-19 DIAGNOSIS — G309 Alzheimer's disease, unspecified: Secondary | ICD-10-CM | POA: Diagnosis present

## 2020-01-19 DIAGNOSIS — N138 Other obstructive and reflux uropathy: Secondary | ICD-10-CM | POA: Diagnosis not present

## 2020-01-19 DIAGNOSIS — H409 Unspecified glaucoma: Secondary | ICD-10-CM | POA: Diagnosis present

## 2020-01-19 DIAGNOSIS — G319 Degenerative disease of nervous system, unspecified: Secondary | ICD-10-CM | POA: Diagnosis not present

## 2020-01-19 DIAGNOSIS — F05 Delirium due to known physiological condition: Secondary | ICD-10-CM | POA: Diagnosis present

## 2020-01-19 DIAGNOSIS — N179 Acute kidney failure, unspecified: Secondary | ICD-10-CM | POA: Diagnosis not present

## 2020-01-19 DIAGNOSIS — R339 Retention of urine, unspecified: Secondary | ICD-10-CM | POA: Diagnosis not present

## 2020-01-19 DIAGNOSIS — R451 Restlessness and agitation: Secondary | ICD-10-CM | POA: Diagnosis not present

## 2020-01-19 DIAGNOSIS — I639 Cerebral infarction, unspecified: Secondary | ICD-10-CM | POA: Diagnosis not present

## 2020-01-19 DIAGNOSIS — F0281 Dementia in other diseases classified elsewhere with behavioral disturbance: Secondary | ICD-10-CM | POA: Diagnosis present

## 2020-01-19 DIAGNOSIS — I6782 Cerebral ischemia: Secondary | ICD-10-CM | POA: Diagnosis not present

## 2020-01-19 DIAGNOSIS — E870 Hyperosmolality and hypernatremia: Secondary | ICD-10-CM | POA: Diagnosis not present

## 2020-01-19 DIAGNOSIS — E8809 Other disorders of plasma-protein metabolism, not elsewhere classified: Secondary | ICD-10-CM | POA: Diagnosis not present

## 2020-01-19 DIAGNOSIS — D849 Immunodeficiency, unspecified: Secondary | ICD-10-CM | POA: Diagnosis present

## 2020-01-19 DIAGNOSIS — R9431 Abnormal electrocardiogram [ECG] [EKG]: Secondary | ICD-10-CM | POA: Diagnosis not present

## 2020-01-19 DIAGNOSIS — I160 Hypertensive urgency: Secondary | ICD-10-CM | POA: Diagnosis present

## 2020-01-19 DIAGNOSIS — D638 Anemia in other chronic diseases classified elsewhere: Secondary | ICD-10-CM | POA: Diagnosis present

## 2020-01-19 DIAGNOSIS — E039 Hypothyroidism, unspecified: Secondary | ICD-10-CM | POA: Diagnosis present

## 2020-01-19 DIAGNOSIS — I440 Atrioventricular block, first degree: Secondary | ICD-10-CM | POA: Diagnosis not present

## 2020-01-19 DIAGNOSIS — K5900 Constipation, unspecified: Secondary | ICD-10-CM | POA: Diagnosis present

## 2020-01-19 DIAGNOSIS — I129 Hypertensive chronic kidney disease with stage 1 through stage 4 chronic kidney disease, or unspecified chronic kidney disease: Secondary | ICD-10-CM | POA: Diagnosis present

## 2020-01-19 DIAGNOSIS — Z20822 Contact with and (suspected) exposure to covid-19: Secondary | ICD-10-CM | POA: Diagnosis present

## 2020-01-19 DIAGNOSIS — N189 Chronic kidney disease, unspecified: Secondary | ICD-10-CM | POA: Diagnosis not present

## 2020-01-19 DIAGNOSIS — E872 Acidosis: Secondary | ICD-10-CM | POA: Diagnosis not present

## 2020-01-19 DIAGNOSIS — Z7982 Long term (current) use of aspirin: Secondary | ICD-10-CM | POA: Diagnosis not present

## 2020-01-19 DIAGNOSIS — J323 Chronic sphenoidal sinusitis: Secondary | ICD-10-CM | POA: Diagnosis present

## 2020-01-19 DIAGNOSIS — N1832 Chronic kidney disease, stage 3b: Secondary | ICD-10-CM | POA: Diagnosis present

## 2020-01-19 DIAGNOSIS — I10 Essential (primary) hypertension: Secondary | ICD-10-CM | POA: Diagnosis not present

## 2020-01-19 DIAGNOSIS — N183 Chronic kidney disease, stage 3 unspecified: Secondary | ICD-10-CM | POA: Diagnosis not present

## 2020-01-19 DIAGNOSIS — E876 Hypokalemia: Secondary | ICD-10-CM | POA: Diagnosis not present

## 2020-01-19 DIAGNOSIS — F0391 Unspecified dementia with behavioral disturbance: Secondary | ICD-10-CM | POA: Diagnosis not present

## 2020-01-19 DIAGNOSIS — Z66 Do not resuscitate: Secondary | ICD-10-CM | POA: Diagnosis present

## 2020-01-27 DIAGNOSIS — Z888 Allergy status to other drugs, medicaments and biological substances status: Secondary | ICD-10-CM | POA: Diagnosis not present

## 2020-01-27 DIAGNOSIS — H409 Unspecified glaucoma: Secondary | ICD-10-CM | POA: Diagnosis not present

## 2020-01-27 DIAGNOSIS — Z79899 Other long term (current) drug therapy: Secondary | ICD-10-CM | POA: Diagnosis not present

## 2020-01-27 DIAGNOSIS — Z20822 Contact with and (suspected) exposure to covid-19: Secondary | ICD-10-CM | POA: Diagnosis not present

## 2020-01-27 DIAGNOSIS — Z87891 Personal history of nicotine dependence: Secondary | ICD-10-CM | POA: Diagnosis not present

## 2020-01-27 DIAGNOSIS — I4891 Unspecified atrial fibrillation: Secondary | ICD-10-CM | POA: Diagnosis not present

## 2020-01-27 DIAGNOSIS — Z7982 Long term (current) use of aspirin: Secondary | ICD-10-CM | POA: Diagnosis not present

## 2020-01-27 DIAGNOSIS — R4182 Altered mental status, unspecified: Secondary | ICD-10-CM | POA: Diagnosis not present

## 2020-01-27 DIAGNOSIS — G309 Alzheimer's disease, unspecified: Secondary | ICD-10-CM | POA: Diagnosis not present

## 2020-01-27 DIAGNOSIS — N183 Chronic kidney disease, stage 3 unspecified: Secondary | ICD-10-CM | POA: Diagnosis not present

## 2020-01-27 DIAGNOSIS — F0281 Dementia in other diseases classified elsewhere with behavioral disturbance: Secondary | ICD-10-CM | POA: Diagnosis not present

## 2020-01-27 DIAGNOSIS — I129 Hypertensive chronic kidney disease with stage 1 through stage 4 chronic kidney disease, or unspecified chronic kidney disease: Secondary | ICD-10-CM | POA: Diagnosis not present

## 2020-01-28 DIAGNOSIS — F0390 Unspecified dementia without behavioral disturbance: Secondary | ICD-10-CM | POA: Diagnosis not present

## 2020-01-28 DIAGNOSIS — Z7401 Bed confinement status: Secondary | ICD-10-CM | POA: Diagnosis not present

## 2020-01-28 DIAGNOSIS — Z9183 Wandering in diseases classified elsewhere: Secondary | ICD-10-CM | POA: Diagnosis not present

## 2020-01-29 DIAGNOSIS — I129 Hypertensive chronic kidney disease with stage 1 through stage 4 chronic kidney disease, or unspecified chronic kidney disease: Secondary | ICD-10-CM | POA: Diagnosis not present

## 2020-01-29 DIAGNOSIS — N390 Urinary tract infection, site not specified: Secondary | ICD-10-CM | POA: Diagnosis not present

## 2020-01-29 DIAGNOSIS — D631 Anemia in chronic kidney disease: Secondary | ICD-10-CM | POA: Diagnosis not present

## 2020-01-29 DIAGNOSIS — I2510 Atherosclerotic heart disease of native coronary artery without angina pectoris: Secondary | ICD-10-CM | POA: Diagnosis not present

## 2020-01-29 DIAGNOSIS — N183 Chronic kidney disease, stage 3 unspecified: Secondary | ICD-10-CM | POA: Diagnosis not present

## 2020-01-29 DIAGNOSIS — R32 Unspecified urinary incontinence: Secondary | ICD-10-CM | POA: Diagnosis not present

## 2020-01-29 DIAGNOSIS — G309 Alzheimer's disease, unspecified: Secondary | ICD-10-CM | POA: Diagnosis not present

## 2020-01-29 DIAGNOSIS — Z87440 Personal history of urinary (tract) infections: Secondary | ICD-10-CM | POA: Diagnosis not present

## 2020-01-29 DIAGNOSIS — D649 Anemia, unspecified: Secondary | ICD-10-CM | POA: Diagnosis not present

## 2020-01-29 DIAGNOSIS — R339 Retention of urine, unspecified: Secondary | ICD-10-CM | POA: Diagnosis not present

## 2020-01-29 DIAGNOSIS — F0391 Unspecified dementia with behavioral disturbance: Secondary | ICD-10-CM | POA: Diagnosis not present

## 2020-01-29 DIAGNOSIS — H409 Unspecified glaucoma: Secondary | ICD-10-CM | POA: Diagnosis not present

## 2020-01-31 DIAGNOSIS — H409 Unspecified glaucoma: Secondary | ICD-10-CM | POA: Diagnosis not present

## 2020-01-31 DIAGNOSIS — I10 Essential (primary) hypertension: Secondary | ICD-10-CM | POA: Diagnosis not present

## 2020-01-31 DIAGNOSIS — Z87891 Personal history of nicotine dependence: Secondary | ICD-10-CM | POA: Diagnosis not present

## 2020-01-31 DIAGNOSIS — R39198 Other difficulties with micturition: Secondary | ICD-10-CM | POA: Diagnosis not present

## 2020-01-31 DIAGNOSIS — Z888 Allergy status to other drugs, medicaments and biological substances status: Secondary | ICD-10-CM | POA: Diagnosis not present

## 2020-01-31 DIAGNOSIS — F0390 Unspecified dementia without behavioral disturbance: Secondary | ICD-10-CM | POA: Diagnosis not present

## 2020-01-31 DIAGNOSIS — F0391 Unspecified dementia with behavioral disturbance: Secondary | ICD-10-CM | POA: Diagnosis not present

## 2020-01-31 DIAGNOSIS — I2510 Atherosclerotic heart disease of native coronary artery without angina pectoris: Secondary | ICD-10-CM | POA: Diagnosis not present

## 2020-01-31 DIAGNOSIS — Z79899 Other long term (current) drug therapy: Secondary | ICD-10-CM | POA: Diagnosis not present

## 2020-01-31 DIAGNOSIS — I129 Hypertensive chronic kidney disease with stage 1 through stage 4 chronic kidney disease, or unspecified chronic kidney disease: Secondary | ICD-10-CM | POA: Diagnosis not present

## 2020-01-31 DIAGNOSIS — N183 Chronic kidney disease, stage 3 unspecified: Secondary | ICD-10-CM | POA: Diagnosis not present

## 2020-01-31 DIAGNOSIS — R34 Anuria and oliguria: Secondary | ICD-10-CM | POA: Diagnosis not present

## 2020-01-31 DIAGNOSIS — R339 Retention of urine, unspecified: Secondary | ICD-10-CM | POA: Diagnosis not present

## 2020-01-31 DIAGNOSIS — G309 Alzheimer's disease, unspecified: Secondary | ICD-10-CM | POA: Diagnosis not present

## 2020-02-01 DIAGNOSIS — F0390 Unspecified dementia without behavioral disturbance: Secondary | ICD-10-CM | POA: Diagnosis not present

## 2020-02-01 DIAGNOSIS — R030 Elevated blood-pressure reading, without diagnosis of hypertension: Secondary | ICD-10-CM | POA: Diagnosis not present

## 2020-02-01 DIAGNOSIS — N19 Unspecified kidney failure: Secondary | ICD-10-CM | POA: Diagnosis not present

## 2020-02-05 DIAGNOSIS — I129 Hypertensive chronic kidney disease with stage 1 through stage 4 chronic kidney disease, or unspecified chronic kidney disease: Secondary | ICD-10-CM | POA: Diagnosis not present

## 2020-02-05 DIAGNOSIS — I2510 Atherosclerotic heart disease of native coronary artery without angina pectoris: Secondary | ICD-10-CM | POA: Diagnosis not present

## 2020-02-05 DIAGNOSIS — N183 Chronic kidney disease, stage 3 unspecified: Secondary | ICD-10-CM | POA: Diagnosis not present

## 2020-02-05 DIAGNOSIS — F0391 Unspecified dementia with behavioral disturbance: Secondary | ICD-10-CM | POA: Diagnosis not present

## 2020-02-05 DIAGNOSIS — H409 Unspecified glaucoma: Secondary | ICD-10-CM | POA: Diagnosis not present

## 2020-02-05 DIAGNOSIS — G309 Alzheimer's disease, unspecified: Secondary | ICD-10-CM | POA: Diagnosis not present

## 2020-02-07 DIAGNOSIS — R339 Retention of urine, unspecified: Secondary | ICD-10-CM | POA: Diagnosis not present

## 2020-02-07 DIAGNOSIS — R630 Anorexia: Secondary | ICD-10-CM | POA: Diagnosis not present

## 2020-02-07 DIAGNOSIS — N390 Urinary tract infection, site not specified: Secondary | ICD-10-CM | POA: Diagnosis not present

## 2020-02-07 DIAGNOSIS — M109 Gout, unspecified: Secondary | ICD-10-CM | POA: Diagnosis not present

## 2020-02-12 ENCOUNTER — Other Ambulatory Visit: Payer: Self-pay | Admitting: Nurse Practitioner

## 2020-02-12 DIAGNOSIS — I10 Essential (primary) hypertension: Secondary | ICD-10-CM

## 2020-02-20 DIAGNOSIS — Z79899 Other long term (current) drug therapy: Secondary | ICD-10-CM | POA: Diagnosis not present

## 2020-02-20 DIAGNOSIS — R7989 Other specified abnormal findings of blood chemistry: Secondary | ICD-10-CM | POA: Diagnosis not present

## 2020-04-15 ENCOUNTER — Ambulatory Visit: Payer: Medicare Other | Admitting: Nurse Practitioner

## 2020-07-03 ENCOUNTER — Encounter: Payer: Medicare Other | Admitting: Nurse Practitioner

## 2020-12-10 DEATH — deceased
